# Patient Record
Sex: Male | Born: 1965 | Race: White | Hispanic: No | Marital: Married | Smoking: Never smoker
Health system: Southern US, Academic
[De-identification: ages and names within clinical notes are randomized; demographics above are authoritative.]

## PROBLEM LIST (undated history)

## (undated) DIAGNOSIS — E785 Hyperlipidemia, unspecified: Secondary | ICD-10-CM

## (undated) DIAGNOSIS — K219 Gastro-esophageal reflux disease without esophagitis: Secondary | ICD-10-CM

## (undated) DIAGNOSIS — I10 Essential (primary) hypertension: Secondary | ICD-10-CM

## (undated) DIAGNOSIS — Z8669 Personal history of other diseases of the nervous system and sense organs: Secondary | ICD-10-CM

## (undated) DIAGNOSIS — G4730 Sleep apnea, unspecified: Secondary | ICD-10-CM

## (undated) DIAGNOSIS — Z9989 Dependence on other enabling machines and devices: Secondary | ICD-10-CM

## (undated) DIAGNOSIS — H8109 Meniere's disease, unspecified ear: Secondary | ICD-10-CM

## (undated) DIAGNOSIS — E7800 Pure hypercholesterolemia, unspecified: Secondary | ICD-10-CM

## (undated) DIAGNOSIS — Z973 Presence of spectacles and contact lenses: Secondary | ICD-10-CM

## (undated) DIAGNOSIS — Z87442 Personal history of urinary calculi: Secondary | ICD-10-CM

## (undated) DIAGNOSIS — C61 Malignant neoplasm of prostate: Secondary | ICD-10-CM

## (undated) DIAGNOSIS — J302 Other seasonal allergic rhinitis: Secondary | ICD-10-CM

## (undated) DIAGNOSIS — B20 Human immunodeficiency virus [HIV] disease: Secondary | ICD-10-CM

## (undated) DIAGNOSIS — Z21 Asymptomatic human immunodeficiency virus [HIV] infection status: Secondary | ICD-10-CM

## (undated) DIAGNOSIS — J189 Pneumonia, unspecified organism: Secondary | ICD-10-CM

## (undated) HISTORY — DX: Other seasonal allergic rhinitis: J30.2

## (undated) HISTORY — PX: PROSTATE BIOPSY: SHX241

## (undated) HISTORY — DX: Human immunodeficiency virus (HIV) disease: B20

## (undated) HISTORY — DX: Asymptomatic human immunodeficiency virus (hiv) infection status: Z21

## (undated) HISTORY — PX: PILONIDAL CYST EXCISION: SHX744

## (undated) HISTORY — DX: Dependence on other enabling machines and devices: Z99.89

## (undated) HISTORY — PX: NEPHRECTOMY RADICAL: SUR878

## (undated) HISTORY — PX: URETHRA SURGERY: SHX824

## (undated) HISTORY — PX: HX MENISCECTOMY: SHX123

## (undated) HISTORY — DX: Essential (primary) hypertension: I10

## (undated) HISTORY — PX: KNEE ARTHROSCOPY W/ MENISCAL REPAIR: SHX1877

## (undated) HISTORY — DX: Hyperlipidemia, unspecified: E78.5

## (undated) HISTORY — DX: Gastro-esophageal reflux disease without esophagitis: K21.9

## (undated) HISTORY — PX: JOINT REPLACEMENT: SHX530

---

## 2002-08-08 ENCOUNTER — Emergency Department (HOSPITAL_COMMUNITY): Admission: EM | Admit: 2002-08-08 | Discharge: 2002-08-09 | Payer: Self-pay | Admitting: *Deleted

## 2002-09-21 ENCOUNTER — Emergency Department (HOSPITAL_COMMUNITY): Admission: EM | Admit: 2002-09-21 | Discharge: 2002-09-21 | Payer: Self-pay | Admitting: Emergency Medicine

## 2003-03-12 ENCOUNTER — Emergency Department: Payer: Self-pay

## 2009-03-30 ENCOUNTER — Ambulatory Visit: Admission: RE | Admit: 2009-03-30 | Payer: Self-pay | Source: Ambulatory Visit | Admitting: EXTERNAL

## 2010-08-20 ENCOUNTER — Emergency Department: Admit: 2010-08-20 | Discharge: 2010-08-20 | Disposition: A | Discharge status: Home / Self Care | Payer: Self-pay

## 2013-03-23 ENCOUNTER — Emergency Department (HOSPITAL_COMMUNITY): Payer: No Typology Code available for payment source

## 2013-03-23 ENCOUNTER — Inpatient Hospital Stay (HOSPITAL_COMMUNITY)
Admission: EM | Admit: 2013-03-23 | Discharge: 2013-03-27 | DRG: 975 | Disposition: A | Discharge status: Home / Self Care | Payer: No Typology Code available for payment source | Attending: Internal Medicine | Admitting: Internal Medicine

## 2013-03-23 ENCOUNTER — Encounter (HOSPITAL_COMMUNITY): Payer: Self-pay | Admitting: Family Medicine

## 2013-03-23 DIAGNOSIS — J189 Pneumonia, unspecified organism: Secondary | ICD-10-CM | POA: Diagnosis present

## 2013-03-23 DIAGNOSIS — R000 Tachycardia, unspecified: Secondary | ICD-10-CM | POA: Diagnosis present

## 2013-03-23 DIAGNOSIS — Z21 Asymptomatic human immunodeficiency virus [HIV] infection status: Secondary | ICD-10-CM

## 2013-03-23 DIAGNOSIS — B1910 Unspecified viral hepatitis B without hepatic coma: Secondary | ICD-10-CM | POA: Diagnosis present

## 2013-03-23 DIAGNOSIS — B20 Human immunodeficiency virus [HIV] disease: Principal | ICD-10-CM | POA: Diagnosis present

## 2013-03-23 DIAGNOSIS — B59 Pneumocystosis: Secondary | ICD-10-CM | POA: Diagnosis present

## 2013-03-23 DIAGNOSIS — R109 Unspecified abdominal pain: Secondary | ICD-10-CM | POA: Diagnosis present

## 2013-03-23 DIAGNOSIS — Z7982 Long term (current) use of aspirin: Secondary | ICD-10-CM

## 2013-03-23 DIAGNOSIS — R0902 Hypoxemia: Secondary | ICD-10-CM | POA: Diagnosis present

## 2013-03-23 DIAGNOSIS — N200 Calculus of kidney: Secondary | ICD-10-CM | POA: Diagnosis present

## 2013-03-23 DIAGNOSIS — Z79899 Other long term (current) drug therapy: Secondary | ICD-10-CM

## 2013-03-23 DIAGNOSIS — I10 Essential (primary) hypertension: Secondary | ICD-10-CM | POA: Diagnosis present

## 2013-03-23 DIAGNOSIS — B159 Hepatitis A without hepatic coma: Secondary | ICD-10-CM | POA: Diagnosis present

## 2013-03-23 LAB — POCT I-STAT, CHEM 8
BUN: 9 mg/dL (ref 6–23)
Chloride: 100 mEq/L (ref 96–112)
Creatinine, Ser: 1.1 mg/dL (ref 0.50–1.35)
Sodium: 139 mEq/L (ref 135–145)

## 2013-03-23 LAB — CBC
HCT: 37.8 % — ABNORMAL LOW (ref 39.0–52.0)
Hemoglobin: 13.9 g/dL (ref 13.0–17.0)
MCH: 29.7 pg (ref 26.0–34.0)
MCV: 87.1 fL (ref 78.0–100.0)
Platelets: 204 10*3/uL (ref 150–400)
Platelets: 180 10*3/uL (ref 150–400)
RBC: 4.7 MIL/uL (ref 4.22–5.81)
RBC: 4.34 MIL/uL (ref 4.22–5.81)
RDW: 12.4 % (ref 11.5–15.5)
WBC: 10.5 10*3/uL (ref 4.0–10.5)
WBC: 8.6 10*3/uL (ref 4.0–10.5)

## 2013-03-23 LAB — COMPREHENSIVE METABOLIC PANEL
ALT: 51 U/L (ref 0–53)
AST: 36 U/L (ref 0–37)
Albumin: 4 g/dL (ref 3.5–5.2)
CO2: 27 mEq/L (ref 19–32)
Chloride: 97 mEq/L (ref 96–112)
Creatinine, Ser: 0.96 mg/dL (ref 0.50–1.35)
GFR calc non Af Amer: >90 mL/min (ref >90)
Sodium: 134 mEq/L — ABNORMAL LOW (ref 135–145)
Total Bilirubin: 0.8 mg/dL (ref 0.3–1.2)

## 2013-03-23 LAB — URINALYSIS, ROUTINE W REFLEX MICROSCOPIC
Glucose, UA: NEGATIVE mg/dL
Ketones, ur: NEGATIVE mg/dL
Leukocytes, UA: NEGATIVE
Nitrite: NEGATIVE
Specific Gravity, Urine: 1.022 (ref 1.005–1.030)
pH: 5 (ref 5.0–8.0)

## 2013-03-23 LAB — URINE MICROSCOPIC-ADD ON

## 2013-03-23 LAB — CG4 I-STAT (LACTIC ACID): Lactic Acid, Venous: 1.55 mmol/L (ref 0.5–2.2)

## 2013-03-23 LAB — CREATININE, SERUM: GFR calc Af Amer: >90 mL/min (ref >90)

## 2013-03-23 MED ORDER — SODIUM CHLORIDE 0.9 % IJ SOLN
3.0000 mL | Freq: Two times a day (BID) | INTRAMUSCULAR | Status: DC
  Administered 2013-03-23 – 2013-03-25 (×3): 3 mL via INTRAVENOUS

## 2013-03-23 MED ORDER — ONDANSETRON HCL 4 MG PO TABS: 4.0000 mg | ORAL_TABLET | Freq: Four times a day (QID) | ORAL | Status: DC | PRN

## 2013-03-23 MED ORDER — ONDANSETRON HCL 4 MG/2ML IJ SOLN
4.0000 mg | Freq: Once | INTRAMUSCULAR | Status: AC
  Administered 2013-03-23: 4 mg via INTRAVENOUS
  Filled 2013-03-23: qty 2

## 2013-03-23 MED ORDER — HYDROMORPHONE HCL PF 1 MG/ML IJ SOLN
1.0000 mg | Freq: Once | INTRAMUSCULAR | Status: AC
  Administered 2013-03-23: 1 mg via INTRAVENOUS
  Filled 2013-03-23: qty 1

## 2013-03-23 MED ORDER — HYDROMORPHONE HCL PF 2 MG/ML IJ SOLN: 1.0000 mg | INTRAMUSCULAR | Status: DC | PRN

## 2013-03-23 MED ORDER — IOHEXOL 300 MG/ML  SOLN
100.0000 mL | Freq: Once | INTRAMUSCULAR | Status: AC | PRN
  Administered 2013-03-23: 100 mL via INTRAVENOUS

## 2013-03-23 MED ORDER — LEVOFLOXACIN IN D5W 750 MG/150ML IV SOLN
750.0000 mg | INTRAVENOUS | Status: DC
  Administered 2013-03-24: 750 mg via INTRAVENOUS
  Filled 2013-03-23: qty 150

## 2013-03-23 MED ORDER — ONDANSETRON HCL 4 MG/2ML IJ SOLN
4.0000 mg | Freq: Four times a day (QID) | INTRAMUSCULAR | Status: DC | PRN
  Administered 2013-03-23: 4 mg via INTRAVENOUS
  Filled 2013-03-23: qty 2

## 2013-03-23 MED ORDER — IOHEXOL 300 MG/ML  SOLN
50.0000 mL | Freq: Once | INTRAMUSCULAR | Status: AC | PRN
  Administered 2013-03-23: 50 mL via ORAL

## 2013-03-23 MED ORDER — LEVOFLOXACIN IN D5W 750 MG/150ML IV SOLN
750.0000 mg | Freq: Once | INTRAVENOUS | Status: AC
  Administered 2013-03-23: 750 mg via INTRAVENOUS
  Filled 2013-03-23: qty 150

## 2013-03-23 MED ORDER — SODIUM CHLORIDE 0.9 % IV BOLUS (SEPSIS)
1000.0000 mL | Freq: Once | INTRAVENOUS | Status: AC
  Administered 2013-03-23: 1000 mL via INTRAVENOUS

## 2013-03-23 MED ORDER — HYDROCODONE-ACETAMINOPHEN 5-325 MG PO TABS
1.0000 | ORAL_TABLET | Freq: Four times a day (QID) | ORAL | Status: DC | PRN
  Administered 2013-03-23 – 2013-03-26 (×3): 1 via ORAL
  Filled 2013-03-23 (×3): qty 1

## 2013-03-23 MED ORDER — LORATADINE 10 MG PO TABS
10.0000 mg | ORAL_TABLET | Freq: Every day | ORAL | Status: DC
  Administered 2013-03-23 – 2013-03-27 (×5): 10 mg via ORAL
  Filled 2013-03-23 (×5): qty 1

## 2013-03-23 MED ORDER — MORPHINE SULFATE 2 MG/ML IJ SOLN
2.0000 mg | INTRAMUSCULAR | Status: DC | PRN
  Administered 2013-03-23 (×2): 4 mg via INTRAVENOUS
  Filled 2013-03-23 (×3): qty 2

## 2013-03-23 MED ORDER — ENOXAPARIN SODIUM 40 MG/0.4ML ~~LOC~~ SOLN
40.0000 mg | SUBCUTANEOUS | Status: DC
  Administered 2013-03-23 – 2013-03-27 (×5): 40 mg via SUBCUTANEOUS
  Filled 2013-03-23 (×5): qty 0.4

## 2013-03-23 MED ORDER — HYDROMORPHONE HCL PF 2 MG/ML IJ SOLN
1.0000 mg | INTRAMUSCULAR | Status: DC | PRN
  Administered 2013-03-23 – 2013-03-24 (×6): 2 mg via INTRAVENOUS
  Administered 2013-03-24: 1 mg via INTRAVENOUS
  Administered 2013-03-25: 2 mg via INTRAVENOUS
  Filled 2013-03-23 (×8): qty 1

## 2013-03-23 MED ORDER — DEXTROSE-NACL 5-0.9 % IV SOLN
INTRAVENOUS | Status: DC
  Administered 2013-03-23 – 2013-03-27 (×11): via INTRAVENOUS

## 2013-03-23 MED ORDER — ACETAMINOPHEN 325 MG PO TABS
650.0000 mg | ORAL_TABLET | Freq: Once | ORAL | Status: AC
  Administered 2013-03-23: 650 mg via ORAL
  Filled 2013-03-23: qty 2

## 2013-03-23 NOTE — ED Notes (Signed)
Pt complains of right sided pain that hurts when he takes a deep breath or coughs, no injury noted, states that it feels like it did when he had a kidney stone but the pain is higher up and he's not having pain or blood when urinating

## 2013-03-23 NOTE — ED Notes (Signed)
Pt drinking contrast. 

## 2013-03-23 NOTE — ED Notes (Signed)
Patient states that he has started having right rib pain on Sunday. States pain has gotten worse. Pain worse with inspiration and coughing. States having shortness of breath.

## 2013-03-23 NOTE — ED Notes (Signed)
Pt completed contrast, ct aware

## 2013-03-23 NOTE — ED Notes (Signed)
Pt returned from CT °

## 2013-03-23 NOTE — H&P (Signed)
[facility] History and Physical  BOBIE KISTLER ZOX:[id number] DOB: November 20, 1965    PCP:  None.  Chief Complaint: shortness of breath and right flank pain.  HPI: Travis Wheeler is an 47 y.o. male with hx of HIV positive, diagnosed 2009, in denial and not seeking any evaluation for his HIV, presents to ER with right flank pain, minimal cough, and shortness of breath.  It has been for 2-3 days.  He has some subjective fever, no chills, no chest pain.  Evaluation in the ER included a CXR showing bibasilar infiltrates, normal WBC and renal fx tests.  He has tachycardia and hypertension in the ER.  A CTPA was performed showing no PE, but concern for PNA and non obtructive nephrolithiasis on the left side.  He was also desat to 85 percent with ambulation.  Hospitalist was asked to admit him for CAP.  Rewiew of Systems:  Constitutional: Negative for  and chills. No significant weight loss or weight gain Eyes: Negative for eye pain, redness and discharge, diplopia, visual changes, or flashes of light. ENMT: Negative for ear pain, hoarseness, nasal congestion, sinus pressure and sore throat. No headaches; tinnitus, drooling, or problem swallowing. Cardiovascular: Negative for chest pain, palpitations, diaphoresis, dyspnea and peripheral edema. ; No orthopnea, PND Respiratory: Negative for hemoptysis, wheezing and stridor. No pleuritic chestpain. Gastrointestinal: Negative for nausea, vomiting, diarrhea, constipation, abdominal pain, melena, blood in stool, hematemesis, jaundice and rectal bleeding.    Genitourinary: Negative for frequency, dysuria, incontinence and hematuria; Musculoskeletal: Negative for back pain and neck pain. Negative for swelling and trauma.;  Skin: . Negative for pruritus, rash, abrasions, bruising and skin lesion.; ulcerations Neuro: Negative for headache, lightheadedness and neck stiffness. Negative for weakness, altered level of consciousness , altered mental status,  extremity weakness, burning feet, involuntary movement, seizure and syncope.  Psych: negative for anxiety, depression, insomnia, tearfulness, panic attacks, hallucinations, paranoia, suicidal or homicidal ideation   Past Medical History  Diagnosis Date  . Kidney stones     History reviewed. No pertinent past surgical history.  Medications:  HOME MEDS: Prior to Admission medications   Medication Sig Start Date End Date Taking? Authorizing Provider  aspirin EC 81 MG tablet Take 162 mg by mouth every 6 (six) hours as needed for pain.   Yes Historical Provider, MD  cetirizine (ZYRTEC) 10 MG tablet Take 10 mg by mouth daily.   Yes Historical Provider, MD     Allergies:  No Known Allergies  Social History:   reports that he has never smoked. He does not have any smokeless tobacco history on file. He reports that  drinks alcohol. He reports that he does not use illicit drugs.  Family History: No family history on file.   Physical Exam: Filed Vitals:   03/23/13 0152 03/23/13 0405 03/23/13 0409 03/23/13 0413  BP: 131/91  157/92   Pulse: 128   113  Temp: 102.5 F (39.2 C) 100.8 F (38.2 C)    TempSrc: Oral Oral    Resp: 22   28  Height: 5\' 11"  (1.803 m)     Weight: 79.379 kg (175 lb)     SpO2: 96%   92%   Blood pressure 157/92, pulse 113, temperature 100.8 F (38.2 C), temperature source Oral, resp. rate 28, height 5\' 11"  (1.803 m), weight 79.379 kg (175 lb), SpO2 92.00%.  GEN:  Pleasant  patient lying in the stretcher in no acute distress; cooperative with exam. PSYCH:  alert and oriented x4; does not appear  anxious or depressed; affect is appropriate. HEENT: Mucous membranes pink and anicteric; PERRLA; EOM intact; no cervical lymphadenopathy nor thyromegaly or carotid bruit; no JVD; There were no stridor. Neck is very supple. Breasts:: Not examined CHEST WALL: No tenderness CHEST: Normal respiration, scattered rhonchi. HEART: Regular rate and rhythm.  There are no  murmur, rub, or gallops.   BACK: No kyphosis or scoliosis; no CVA tenderness ABDOMEN: soft and non-tender; no masses, no organomegaly, normal abdominal bowel sounds; no pannus; no intertriginous candida. There is no rebound and no distention. Rectal Exam: Not done EXTREMITIES: No bone or joint deformity; age-appropriate arthropathy of the hands and knees; no edema; no ulcerations.  There is no calf tenderness. Genitalia: not examined PULSES: 2+ and symmetric SKIN: Normal hydration no rash or ulceration CNS: Cranial nerves 2-12 grossly intact no focal lateralizing neurologic deficit.  Speech is fluent; uvula elevated with phonation, facial symmetry and tongue midline. DTR are normal bilaterally, cerebella exam is intact, barbinski is negative and strengths are equaled bilaterally.  No sensory loss.   Labs on Admission:  Basic Metabolic Panel:  Recent Labs Lab 03/23/13 0210 03/23/13 0221  NA 134* 139  K 3.6 3.7  CL 97 100  CO2 27  --   GLUCOSE 132* 132*  BUN 9 9  CREATININE 0.96 1.10  CALCIUM 9.4  --    Liver Function Tests:  Recent Labs Lab 03/23/13 0210  AST 36  ALT 51  ALKPHOS 98  BILITOT 0.8  PROT 9.3*  ALBUMIN 4.0    Recent Labs Lab 03/23/13 0210  LIPASE 14   No results found for this basename: AMMONIA,  in the last 168 hours CBC:  Recent Labs Lab 03/23/13 0210 03/23/13 0221  WBC 10.5  --   HGB 13.9 15.3  HCT 40.7 45.0  MCV 86.6  --   PLT 204  --    Cardiac Enzymes: No results found for this basename: CKTOTAL, CKMB, CKMBINDEX, TROPONINI,  in the last 168 hours  CBG: No results found for this basename: GLUCAP,  in the last 168 hours   Radiological Exams on Admission: Dg Chest 2 View  03/23/2013   *RADIOLOGY REPORT*  Clinical Data: Shortness of breath, fever.  CHEST - 2 VIEW  Comparison: None.  Findings: Bibasilar atelectasis or infiltrates, right greater than left.  Heart is normal size.  No effusions or acute bony abnormality.  IMPRESSION:  Bibasilar airspace opacities, right greater than left.  This could represent atelectasis or infiltrates.  Cannot exclude pneumonia, particularly in the right lung base.   Original Report Authenticated By: Charlett Nose, M.D.   Ct Abdomen Pelvis W Contrast  03/23/2013   *RADIOLOGY REPORT*  Clinical Data: Right-sided abdominal pain, fever.  CT ABDOMEN AND PELVIS WITH CONTRAST  Technique:  Multidetector CT imaging of the abdomen and pelvis was performed following the standard protocol during bolus administration of intravenous contrast.  Contrast: OMNIPAQUE IOHEXOL 300 MG/ML  SOLN  Comparison: None.  Findings: Bilateral lower lobe airspace consolidation, right greater than left concerning for pneumonia.  Trace right pleural effusion.  Heart is normal size.  Liver, gallbladder, spleen, pancreas, adrenals have an unremarkable appearance.  Small scattered low density areas within the kidneys, likely small cysts.  8 mm nonobstructing left lower pole renal stone.  No hydronephrosis.  No ureteral stones.  Urinary bladder is unremarkable.  Normal retrocecal appendix.  Moderate gas and stool throughout the colon.  Small bowel is decompressed.  No free fluid, free air or adenopathy.  Aorta is normal caliber.  No acute bony abnormality.  IMPRESSION: Bilateral lower lobe infiltrates, right greater than left concerning for pneumonia.  Trace right pleural effusion.  Small bilateral renal cysts.  Left nephrolithiasis.  Normal appendix.   Original Report Authenticated By: Charlett Nose, M.D.    Assessment/Plan Present on Admission:  . CAP (community acquired pneumonia) . Right flank pain . HIV (human immunodeficiency virus infection)  PLAN:  Will admit him for CAP.  I have started him on Levaquin and oxygen.  He was encouraged to seek medical care for his HIV, as he could live a long and meaningful life with HIV disease.  He is otherwise stable, full code, and will be admitted to [facility] service.  I think the right flank is  from the PNA itself.  He does have tachycardia and hypertension which will be followed.  I will treat both with morphine.  He is stable, full code, and will be admitted [facility] service.  Thank you for allowing me to participate in the care of this nice patient.  Other plans as per orders.  Code Status: FULL Unk Lightning, MD. [facility] Pager [phone] 7pm to 7am.  03/23/2013, 6:39 AM

## 2013-03-23 NOTE — Progress Notes (Signed)
[facility] PROGRESS NOTE  Travis Wheeler ZOX:[id number] DOB: 06/07/1966 DOA: 03/23/2013 PCP: No primary provider on file.  Assessment/Plan: 1. Community acquired PNA: Continue with Levaquin.  2. History of HIV: has not follow up with PCP. I will order CD4 count, Viral load. Will consider ID consult.   Code Status: Full  Family Communication: Care discussed with patient.  Disposition Plan: home when stable.    Consultants:  none  Procedures:  None  Antibiotics:  Levaquin  HPI/Subjective: Feeling better. Relates dry cough. He has not follow up with a d DR for HIV.   Objective: Filed Vitals:   03/23/13 0409 03/23/13 0413 03/23/13 0826 03/23/13 1509  BP: 157/92  140/79 147/72  Pulse:  113 105 105  Temp:   98.9 F (37.2 C) 99.4 F (37.4 C)  TempSrc:   Oral Oral  Resp:  28 20 18   Height:   5\' 11"  (1.803 m)   Weight:   79.379 kg (175 lb)   SpO2:  92% 100% 99%    Intake/Output Summary (Last 24 hours) at 03/23/13 1723 Last data filed at 03/23/13 1717  Gross per 24 hour  Intake    240 ml  Output   1800 ml  Net  -1560 ml   Filed Weights   03/23/13 0152 03/23/13 0826  Weight: 79.379 kg (175 lb) 79.379 kg (175 lb)    Exam:   General:  No distress.   Cardiovascular: S 1, S 2 RRR  Respiratory: bilateral ronchus  Abdomen: BS present. Soft, NT  Musculoskeletal: no edema.   Data Reviewed: Basic Metabolic Panel:  Recent Labs Lab 03/23/13 0210 03/23/13 0221 03/23/13 0845  NA 134* 139  --   K 3.6 3.7  --   CL 97 100  --   CO2 27  --   --   GLUCOSE 132* 132*  --   BUN 9 9  --   CREATININE 0.96 1.10 0.84  CALCIUM 9.4  --   --    Liver Function Tests:  Recent Labs Lab 03/23/13 0210  AST 36  ALT 51  ALKPHOS 98  BILITOT 0.8  PROT 9.3*  ALBUMIN 4.0    Recent Labs Lab 03/23/13 0210  LIPASE 14   No results found for this basename: AMMONIA,  in the last 168 hours CBC:  Recent Labs Lab 03/23/13 0210 03/23/13 0221 03/23/13 0845   WBC 10.5  --  8.6  HGB 13.9 15.3 12.9*  HCT 40.7 45.0 37.8*  MCV 86.6  --  87.1  PLT 204  --  180   Cardiac Enzymes: No results found for this basename: CKTOTAL, CKMB, CKMBINDEX, TROPONINI,  in the last 168 hours BNP (last 3 results) No results found for this basename: PROBNP,  in the last 8760 hours CBG: No results found for this basename: GLUCAP,  in the last 168 hours  No results found for this or any previous visit (from the past 240 hour(s)).   Studies: Dg Chest 2 View  03/23/2013   *RADIOLOGY REPORT*  Clinical Data: Shortness of breath, fever.  CHEST - 2 VIEW  Comparison: None.  Findings: Bibasilar atelectasis or infiltrates, right greater than left.  Heart is normal size.  No effusions or acute bony abnormality.  IMPRESSION: Bibasilar airspace opacities, right greater than left.  This could represent atelectasis or infiltrates.  Cannot exclude pneumonia, particularly in the right lung base.   Original Report Authenticated By: Charlett Nose, M.D.   Ct Abdomen Pelvis W Contrast  03/23/2013   *  RADIOLOGY REPORT*  Clinical Data: Right-sided abdominal pain, fever.  CT ABDOMEN AND PELVIS WITH CONTRAST  Technique:  Multidetector CT imaging of the abdomen and pelvis was performed following the standard protocol during bolus administration of intravenous contrast.  Contrast: OMNIPAQUE IOHEXOL 300 MG/ML  SOLN  Comparison: None.  Findings: Bilateral lower lobe airspace consolidation, right greater than left concerning for pneumonia.  Trace right pleural effusion.  Heart is normal size.  Liver, gallbladder, spleen, pancreas, adrenals have an unremarkable appearance.  Small scattered low density areas within the kidneys, likely small cysts.  8 mm nonobstructing left lower pole renal stone.  No hydronephrosis.  No ureteral stones.  Urinary bladder is unremarkable.  Normal retrocecal appendix.  Moderate gas and stool throughout the colon.  Small bowel is decompressed.  No free fluid, free air or  adenopathy.  Aorta is normal caliber.  No acute bony abnormality.  IMPRESSION: Bilateral lower lobe infiltrates, right greater than left concerning for pneumonia.  Trace right pleural effusion.  Small bilateral renal cysts.  Left nephrolithiasis.  Normal appendix.   Original Report Authenticated By: Charlett Nose, M.D.    Scheduled Meds: . enoxaparin (LOVENOX) injection  40 mg Subcutaneous Q24H  . [START ON 03/24/2013] levofloxacin (LEVAQUIN) IV  750 mg Intravenous Q24H  . loratadine  10 mg Oral Daily  . sodium chloride  3 mL Intravenous Q12H   Continuous Infusions: . dextrose 5 % and 0.9% NaCl 100 mL/hr at 03/23/13 0831    Principal Problem:   CAP (community acquired pneumonia) Active Problems:   Right flank pain   HIV (human immunodeficiency virus infection)    Time spent: 25 minutes.     Dennise Bamber  [facility] Pager [phone]. If 7PM-7AM, please contact night-coverage at [email or web address], password [facility] 03/23/2013, 5:23 PM  LOS: 0 days

## 2013-03-23 NOTE — ED Notes (Signed)
Report given to [address] west, pt ready for transport

## 2013-03-23 NOTE — ED Provider Notes (Addendum)
History    CSN: [id number] Arrival date & time 03/23/13  0132  First MD Initiated Contact with Patient 03/23/13 0207     Chief Complaint  Patient presents with  . Chest Pain   (Consider location/radiation/quality/duration/timing/severity/associated sxs/prior Treatment) HPI Hx per PT. R sided flank pain x 2 days not migrating, developed associated fever yesterday and now pain is more severe.  No hematuria, has h/o kidney stone, pain worse with movement or breathing. Some mild dry cough. No trauma. No back pain. No known alleviating factors.   Past Medical History  Diagnosis Date  . Kidney stones    History reviewed. No pertinent past surgical history. No family history on file. History  Substance Use Topics  . Smoking status: Never Smoker   . Smokeless tobacco: Not on file  . Alcohol Use: Yes     Comment: Socially    Review of Systems  Constitutional: Positive for fever and chills.  HENT: Negative for neck pain and neck stiffness.   Eyes: Negative for visual disturbance.  Respiratory: Negative for shortness of breath.   Cardiovascular: Negative for chest pain.  Gastrointestinal: Positive for abdominal pain. Negative for vomiting.  Genitourinary: Negative for hematuria.  Musculoskeletal: Negative for back pain.  Skin: Negative for rash.  Neurological: Negative for headaches.  All other systems reviewed and are negative.    Allergies  Review of patient's allergies indicates no known allergies.  Home Medications   Current Outpatient Rx  Name  Route  Sig  Dispense  Refill  . aspirin EC 81 MG tablet   Oral   Take 162 mg by mouth every 6 (six) hours as needed for pain.         . cetirizine (ZYRTEC) 10 MG tablet   Oral   Take 10 mg by mouth daily.          BP 131/91  Pulse 128  Temp(Src) 102.5 F (39.2 C) (Oral)  Resp 22  Ht 5\' 11"  (1.803 m)  Wt 175 lb (79.379 kg)  BMI 24.42 kg/m2  SpO2 96% Physical Exam  Constitutional: He is oriented to person,  place, and time. He appears well-developed and well-nourished.  HENT:  Head: Normocephalic and atraumatic.  Eyes: EOM are normal. Pupils are equal, round, and reactive to light.  Neck: Neck supple.  Cardiovascular: Regular rhythm and intact distal pulses.   tachycardic  Pulmonary/Chest: Effort normal and breath sounds normal. No respiratory distress. He exhibits no tenderness.  Abdominal: There is no rebound and no guarding.  R flank tenderness, RUQ and RLQ tenderness. ABD soft otherwise  Musculoskeletal: Normal range of motion. He exhibits no edema.  Neurological: He is alert and oriented to person, place, and time.  Skin: Skin is warm and dry.    ED Course  Procedures (including critical care time)  Results for orders placed during the hospital encounter of 03/23/13  CBC      Result Value Range   WBC 10.5  4.0 - 10.5 K/uL   RBC 4.70  4.22 - 5.81 MIL/uL   Hemoglobin 13.9  13.0 - 17.0 g/dL   HCT 40.9  81.1 - 91.4 %   MCV 86.6  78.0 - 100.0 fL   MCH 29.6  26.0 - 34.0 pg   MCHC 34.2  30.0 - 36.0 g/dL   RDW 78.2  95.6 - 21.3 %   Platelets 204  150 - 400 K/uL  URINALYSIS, ROUTINE W REFLEX MICROSCOPIC      Result Value Range  Color, Urine YELLOW  YELLOW   APPearance CLEAR  CLEAR   Specific Gravity, Urine 1.022  1.005 - 1.030   pH 5.0  5.0 - 8.0   Glucose, UA NEGATIVE  NEGATIVE mg/dL   Hgb urine dipstick TRACE (*) NEGATIVE   Bilirubin Urine NEGATIVE  NEGATIVE   Ketones, ur NEGATIVE  NEGATIVE mg/dL   Protein, ur 161 (*) NEGATIVE mg/dL   Urobilinogen, UA 2.0 (*) 0.0 - 1.0 mg/dL   Nitrite NEGATIVE  NEGATIVE   Leukocytes, UA NEGATIVE  NEGATIVE  COMPREHENSIVE METABOLIC PANEL      Result Value Range   Sodium 134 (*) 135 - 145 mEq/L   Potassium 3.6  3.5 - 5.1 mEq/L   Chloride 97  96 - 112 mEq/L   CO2 27  19 - 32 mEq/L   Glucose, Bld 132 (*) 70 - 99 mg/dL   BUN 9  6 - 23 mg/dL   Creatinine, Ser 0.96  0.50 - 1.35 mg/dL   Calcium 9.4  8.4 - 04.5 mg/dL   Total Protein 9.3 (*)  6.0 - 8.3 g/dL   Albumin 4.0  3.5 - 5.2 g/dL   AST 36  0 - 37 U/L   ALT 51  0 - 53 U/L   Alkaline Phosphatase 98  39 - 117 U/L   Total Bilirubin 0.8  0.3 - 1.2 mg/dL   GFR calc non Af Amer >90  >90 mL/min   GFR calc Af Amer >90  >90 mL/min  LIPASE, BLOOD      Result Value Range   Lipase 14  11 - 59 U/L  URINE MICROSCOPIC-ADD ON      Result Value Range   Squamous Epithelial / LPF RARE  RARE   WBC, UA 0-2  <3 WBC/hpf   Urine-Other MUCOUS PRESENT    CG4 I-STAT (LACTIC ACID)      Result Value Range   Lactic Acid, Venous 1.55  0.5 - 2.2 mmol/L  POCT I-STAT, CHEM 8      Result Value Range   Sodium 139  135 - 145 mEq/L   Potassium 3.7  3.5 - 5.1 mEq/L   Chloride 100  96 - 112 mEq/L   BUN 9  6 - 23 mg/dL   Creatinine, Ser 4.09  0.50 - 1.35 mg/dL   Glucose, Bld 811 (*) 70 - 99 mg/dL   Calcium, Ion 9.14  7.82 - 1.23 mmol/L   TCO2 26  0 - 100 mmol/L   Hemoglobin 15.3  13.0 - 17.0 g/dL   HCT 95.6  21.3 - 08.6 %   Dg Chest 2 View  03/23/2013   *RADIOLOGY REPORT*  Clinical Data: Shortness of breath, fever.  CHEST - 2 VIEW  Comparison: None.  Findings: Bibasilar atelectasis or infiltrates, right greater than left.  Heart is normal size.  No effusions or acute bony abnormality.  IMPRESSION: Bibasilar airspace opacities, right greater than left.  This could represent atelectasis or infiltrates.  Cannot exclude pneumonia, particularly in the right lung base.   Original Report Authenticated By: Charlett Nose, M.D.   Ct Abdomen Pelvis W Contrast  03/23/2013   *RADIOLOGY REPORT*  Clinical Data: Right-sided abdominal pain, fever.  CT ABDOMEN AND PELVIS WITH CONTRAST  Technique:  Multidetector CT imaging of the abdomen and pelvis was performed following the standard protocol during bolus administration of intravenous contrast.  Contrast: OMNIPAQUE IOHEXOL 300 MG/ML  SOLN  Comparison: None.  Findings: Bilateral lower lobe airspace consolidation, right greater than left concerning  for pneumonia.   Trace right pleural effusion.  Heart is normal size.  Liver, gallbladder, spleen, pancreas, adrenals have an unremarkable appearance.  Small scattered low density areas within the kidneys, likely small cysts.  8 mm nonobstructing left lower pole renal stone.  No hydronephrosis.  No ureteral stones.  Urinary bladder is unremarkable.  Normal retrocecal appendix.  Moderate gas and stool throughout the colon.  Small bowel is decompressed.  No free fluid, free air or adenopathy.  Aorta is normal caliber.  No acute bony abnormality.  IMPRESSION: Bilateral lower lobe infiltrates, right greater than left concerning for pneumonia.  Trace right pleural effusion.  Small bilateral renal cysts.  Left nephrolithiasis.  Normal appendix.   Original Report Authenticated By: Charlett Nose, M.D.   IVFs, IV Dilaudid, IV zofran  IV levaquin  5:15 AM RA sats 85% with ambulation, O2 provided, MED consult. Dr. Conley Rolls to admit MDM   Right-sided pain with fever 102.5  Found to have bilateral pneumonia  Evaluated with labs and imaging as above  IV narcotics pain control and IV antibiotics provided  Has oxygen requirement/ medical admit  Sunnie Nielsen, MD 03/23/13 0541   Date: 03/23/2013  Rate: 117  Rhythm: sinus tachycardia  QRS Axis: normal  Intervals: normal  ST/T Wave abnormalities: nonspecific ST changes  Conduction Disutrbances:none  Narrative Interpretation:   Old EKG Reviewed: none available    Sunnie Nielsen, MD 03/23/13 [phone]

## 2013-03-24 LAB — HIV-1 RNA QUANT-NO REFLEX-BLD: HIV-1 RNA Quant, Log: 4.75 {Log} — ABNORMAL HIGH (ref <1.30)

## 2013-03-24 LAB — CD4/CD8 (T-HELPER/T-SUPPRESSOR CELL)
CD4 absolute: 100 /uL — ABNORMAL LOW (ref 500–1900)
CD8 T Cell Abs: 920 /uL (ref 230–1000)
CD8tox: 65 % — ABNORMAL HIGH (ref 15.0–40.0)
Total lymphocyte count: 1410 /uL (ref 1000–4000)

## 2013-03-24 LAB — BLOOD GAS, ARTERIAL
Acid-Base Excess: 3.4 mmol/L — ABNORMAL HIGH (ref 0.0–2.0)
Bicarbonate: 28 mEq/L — ABNORMAL HIGH (ref 20.0–24.0)
TCO2: 25.2 mmol/L (ref 0–100)
pCO2 arterial: 44.7 mmHg (ref 35.0–45.0)
pH, Arterial: 7.413 (ref 7.350–7.450)

## 2013-03-24 MED ORDER — ELVITEG-COBIC-EMTRICIT-TENOFDF 150-150-200-300 MG PO TABS
1.0000 | ORAL_TABLET | Freq: Every day | ORAL | Status: DC
  Administered 2013-03-25 – 2013-03-27 (×3): 1 via ORAL
  Filled 2013-03-24 (×5): qty 1

## 2013-03-24 MED ORDER — PREDNISONE 20 MG PO TABS
40.0000 mg | ORAL_TABLET | Freq: Two times a day (BID) | ORAL | Status: DC
  Administered 2013-03-24 – 2013-03-27 (×6): 40 mg via ORAL
  Filled 2013-03-24 (×8): qty 2

## 2013-03-24 MED ORDER — LEVOFLOXACIN 750 MG PO TABS
750.0000 mg | ORAL_TABLET | Freq: Every day | ORAL | Status: DC
  Administered 2013-03-25 – 2013-03-27 (×3): 750 mg via ORAL
  Filled 2013-03-24 (×3): qty 1

## 2013-03-24 MED ORDER — SULFAMETHOXAZOLE-TMP DS 800-160 MG PO TABS
2.0000 | ORAL_TABLET | Freq: Three times a day (TID) | ORAL | Status: DC
  Administered 2013-03-24 – 2013-03-27 (×9): 2 via ORAL
  Filled 2013-03-24 (×12): qty 2

## 2013-03-24 NOTE — Progress Notes (Signed)
ANTIBIOTIC CONSULT NOTE - INITIAL  Pharmacy Consult for Septra Indication: r/o PCP PNA in HIV positive patient  No Known Allergies  Patient Measurements: Height: 5\' 11"  (180.3 cm) Weight: 175 lb (79.379 kg) IBW/kg (Calculated) : 75.3  Vital Signs: Temp: 99.8 F (37.7 C) (06/26 0456) Temp src: Oral (06/26 0456) BP: 137/71 mmHg (06/26 0456) Pulse Rate: 120 (06/26 0456) Intake/Output from previous day: 06/25 0701 - 06/26 0700 In: 2761.7 [P.O.:480; I.V.:2131.7; IV Piggyback:150] Out: 2950 [Urine:2950] Intake/Output from this shift: Total I/O In: 120 [P.O.:120] Out: -   Labs:  Recent Labs  03/23/13 0210 03/23/13 0221 03/23/13 0845  WBC 10.5  --  8.6  HGB 13.9 15.3 12.9*  PLT 204  --  180  CREATININE 0.96 1.10 0.84   Estimated Creatinine Clearance: 115.8 ml/min (by C-G formula based on Cr of 0.84). No results found for this basename: VANCOTROUGH, VANCOPEAK, VANCORANDOM, GENTTROUGH, GENTPEAK, GENTRANDOM, TOBRATROUGH, TOBRAPEAK, TOBRARND, AMIKACINPEAK, AMIKACINTROU, AMIKACIN,  in the last 72 hours   Microbiology: No results found for this or any previous visit (from the past 720 hour(s)).  Medical History: Past Medical History  Diagnosis Date  . Kidney stones     Assessment: 67 yoM with history of HIV presents with pneumonia (6/25 CXR showing bibasilar infiltrates).  Pt was not on HAART or OI prophylaxis PTA.  6/25 CD4 count 100.  Started on Levaquin and pharmacy asked to dose Septra for treatment for PCP PNA until ruled out.  Renal function WNL.  Goal of Therapy:  Eradication of infection  Plan:  Septra 2 DS tablets q8h. F/u ID work-up.  Clance Boll 03/24/2013,11:23 AM

## 2013-03-24 NOTE — Consult Note (Signed)
    [facility]     Reason for Consult: ?PCP, 042    Referring Physician: Dr. Sunnie Nielsen  Principal Problem:   CAP (community acquired pneumonia) Active Problems:   Right flank pain   HIV (human immunodeficiency virus infection)   . [START ON 03/25/2013] elvitegravir-cobicistat-emtricitabine-tenofovir  1 tablet Oral Q breakfast  . enoxaparin (LOVENOX) injection  40 mg Subcutaneous Q24H  . [START ON 03/25/2013] levofloxacin  750 mg Oral Daily  . loratadine  10 mg Oral Daily  . sodium chloride  3 mL Intravenous Q12H  . sulfamethoxazole-trimethoprim  2 tablet Oral Q8H    Recommendations: PCPtreatment with Bactrim  And steroids I will start Stribild due to PCP pneumonia with better outcomes on ARVs HIV labs Continue levaquin as well, I have changed to po    Assessment: He has AIDS and now PCP pneumonia I suspect with low pO2 and low walking O2 sat.     Antibiotics: Levaquin, bactrim, steroids  HPI: Travis Wheeler is a 47 y.o. male with history of 042 and did not seek care due to  Cost issues here with SOB and hypoxia likely PCPpneumonia.  No history of OIs, STIs.  Lives in [place] area.  No fever but noted chills.  Has know diagnosis for some time.     Review of Systems: A comprehensive review of systems was negative.  Past Medical History  Diagnosis Date  . Kidney stones     History  Substance Use Topics  . Smoking status: Never Smoker   . Smokeless tobacco: Never Used  . Alcohol Use: Yes     Comment: Socially    History reviewed. No pertinent family history. No Known Allergies  OBJECTIVE: Blood pressure 137/71, pulse 120, temperature 99.8 F (37.7 C), temperature source Oral, resp. rate 18, height 5\' 11"  (1.803 m), weight 175 lb (79.379 kg), SpO2 90.00%. General: AAO x3, nad Skin: no rashes Lungs: some congestion but no crackles appreciated Cor: RRR Abdomen: Soft, nt, nd, +bs LAD - no lad cervical,  supraclavicular  Microbiology: No results found for this or any previous visit (from the past 240 hour(s)).  Staci Righter, MD [facility] [email or web address] [phone] pager  [phone] cell 03/24/2013, 1:15 PM

## 2013-03-24 NOTE — Progress Notes (Signed)
[facility] PROGRESS NOTE  Travis Wheeler ZOX:[id number] DOB: 13-Nov-1965 DOA: 03/23/2013 PCP: No primary provider on file.  Assessment/Plan: 1. Community acquired PNA: Continue with Levaquin. CD 4 less than 100. Will start treatment for PCP PNA. Will get ABG and consider prednisone if POA less than 70.  2. History of HIV: has not follow up with PCP. CD4 less than 100. Dr Luciana Axe consulted.   Code Status: Full  Family Communication: Care discussed with patient.  Disposition Plan: home when stable.    Consultants:  none  Procedures:  None  Antibiotics:  Levaquin  HPI/Subjective: Feeling better, still with right side flank, chest pain.  He agree to speak with ID.  He does not want that we give information to his family.   Objective: Filed Vitals:   03/23/13 0826 03/23/13 1509 03/23/13 2215 03/24/13 0456  BP: 140/79 147/72 134/79 137/71  Pulse: 105 105 105 120  Temp: 98.9 F (37.2 C) 99.4 F (37.4 C) 100 F (37.8 C) 99.8 F (37.7 C)  TempSrc: Oral Oral Oral Oral  Resp: 20 18 18 18   Height: 5\' 11"  (1.803 m)     Weight: 79.379 kg (175 lb)     SpO2: 100% 99% 99% 90%    Intake/Output Summary (Last 24 hours) at 03/24/13 1106 Last data filed at 03/24/13 0900  Gross per 24 hour  Intake 2881.66 ml  Output   2400 ml  Net 481.66 ml   Filed Weights   03/23/13 0152 03/23/13 0826  Weight: 79.379 kg (175 lb) 79.379 kg (175 lb)    Exam:   General:  No distress.   Cardiovascular: S 1, S 2 RRR  Respiratory: bilateral ronchus  Abdomen: BS present. Soft, NT  Musculoskeletal: no edema.   Data Reviewed: Basic Metabolic Panel:  Recent Labs Lab 03/23/13 0210 03/23/13 0221 03/23/13 0845  NA 134* 139  --   K 3.6 3.7  --   CL 97 100  --   CO2 27  --   --   GLUCOSE 132* 132*  --   BUN 9 9  --   CREATININE 0.96 1.10 0.84  CALCIUM 9.4  --   --    Liver Function Tests:  Recent Labs Lab 03/23/13 0210  AST 36  ALT 51  ALKPHOS 98  BILITOT 0.8  PROT  9.3*  ALBUMIN 4.0    Recent Labs Lab 03/23/13 0210  LIPASE 14   No results found for this basename: AMMONIA,  in the last 168 hours CBC:  Recent Labs Lab 03/23/13 0210 03/23/13 0221 03/23/13 0845  WBC 10.5  --  8.6  HGB 13.9 15.3 12.9*  HCT 40.7 45.0 37.8*  MCV 86.6  --  87.1  PLT 204  --  180   Cardiac Enzymes: No results found for this basename: CKTOTAL, CKMB, CKMBINDEX, TROPONINI,  in the last 168 hours BNP (last 3 results) No results found for this basename: PROBNP,  in the last 8760 hours CBG: No results found for this basename: GLUCAP,  in the last 168 hours  No results found for this or any previous visit (from the past 240 hour(s)).   Studies: Dg Chest 2 View  03/23/2013   *RADIOLOGY REPORT*  Clinical Data: Shortness of breath, fever.  CHEST - 2 VIEW  Comparison: None.  Findings: Bibasilar atelectasis or infiltrates, right greater than left.  Heart is normal size.  No effusions or acute bony abnormality.  IMPRESSION: Bibasilar airspace opacities, right greater than left.  This could  represent atelectasis or infiltrates.  Cannot exclude pneumonia, particularly in the right lung base.   Original Report Authenticated By: Charlett Nose, M.D.   Ct Abdomen Pelvis W Contrast  03/23/2013   *RADIOLOGY REPORT*  Clinical Data: Right-sided abdominal pain, fever.  CT ABDOMEN AND PELVIS WITH CONTRAST  Technique:  Multidetector CT imaging of the abdomen and pelvis was performed following the standard protocol during bolus administration of intravenous contrast.  Contrast: OMNIPAQUE IOHEXOL 300 MG/ML  SOLN  Comparison: None.  Findings: Bilateral lower lobe airspace consolidation, right greater than left concerning for pneumonia.  Trace right pleural effusion.  Heart is normal size.  Liver, gallbladder, spleen, pancreas, adrenals have an unremarkable appearance.  Small scattered low density areas within the kidneys, likely small cysts.  8 mm nonobstructing left lower pole renal stone.   No hydronephrosis.  No ureteral stones.  Urinary bladder is unremarkable.  Normal retrocecal appendix.  Moderate gas and stool throughout the colon.  Small bowel is decompressed.  No free fluid, free air or adenopathy.  Aorta is normal caliber.  No acute bony abnormality.  IMPRESSION: Bilateral lower lobe infiltrates, right greater than left concerning for pneumonia.  Trace right pleural effusion.  Small bilateral renal cysts.  Left nephrolithiasis.  Normal appendix.   Original Report Authenticated By: Charlett Nose, M.D.    Scheduled Meds: . enoxaparin (LOVENOX) injection  40 mg Subcutaneous Q24H  . levofloxacin (LEVAQUIN) IV  750 mg Intravenous Q24H  . loratadine  10 mg Oral Daily  . sodium chloride  3 mL Intravenous Q12H   Continuous Infusions: . dextrose 5 % and 0.9% NaCl 100 mL/hr at 03/24/13 1610    Principal Problem:   CAP (community acquired pneumonia) Active Problems:   Right flank pain   HIV (human immunodeficiency virus infection)    Time spent: 25 minutes.     Manjot Hinks  [facility] Pager [phone]. If 7PM-7AM, please contact night-coverage at [email or web address], password [facility] 03/24/2013, 11:06 AM  LOS: 1 day

## 2013-03-25 LAB — HEPATITIS B CORE ANTIBODY, TOTAL: Hep B Core Total Ab: POSITIVE — AB

## 2013-03-25 LAB — BASIC METABOLIC PANEL
Calcium: 9 mg/dL (ref 8.4–10.5)
Creatinine, Ser: 0.79 mg/dL (ref 0.50–1.35)
GFR calc non Af Amer: >90 mL/min (ref >90)
Glucose, Bld: 117 mg/dL — ABNORMAL HIGH (ref 70–99)
Sodium: 139 mEq/L (ref 135–145)

## 2013-03-25 LAB — CBC
MCH: 29.9 pg (ref 26.0–34.0)
MCHC: 34.2 g/dL (ref 30.0–36.0)
MCV: 87.2 fL (ref 78.0–100.0)
Platelets: 222 10*3/uL (ref 150–400)

## 2013-03-25 LAB — HEPATITIS B SURFACE ANTIBODY,QUALITATIVE: Hep B S Ab: REACTIVE — AB

## 2013-03-25 LAB — HEPATITIS A ANTIBODY, TOTAL: Hep A Total Ab: POSITIVE — AB

## 2013-03-25 MED ORDER — HYDROCORTISONE ACETATE 25 MG RE SUPP
25.0000 mg | Freq: Two times a day (BID) | RECTAL | Status: DC | PRN
  Administered 2013-03-25: 25 mg via RECTAL
  Filled 2013-03-25: qty 1

## 2013-03-25 NOTE — Progress Notes (Signed)
[facility] PROGRESS NOTE  Travis Wheeler:[id number] DOB: 1966-07-23 DOA: 03/23/2013 PCP: No primary provider on file.  Assessment/Plan: 1. Community acquired PNA/ PCP PNA: Continue with Levaquin day 3. CD 4 less than 100. Started on  treatment for PCP PNA 6-26. Continue with Bactrim day 2/21. PO2 less than 70. Continue with prednisone 40 mg BID day 2.   2. AIDS/HIV: CD4 less than 100. Dr Luciana Axe consulted. Started on STRIBILD. Hepatitis panel pending. Toxoplasma pending.   Code Status: Full  Family Communication: Care discussed with patient.  Disposition Plan: home when stable.    Consultants:  none  Procedures:  None  Antibiotics:  Levaquin  HPI/Subjective: Feeling better. Still with dry cough.   Objective: Filed Vitals:   03/24/13 1355 03/24/13 1403 03/24/13 2124 03/25/13 0550  BP:  148/86 152/75 139/77  Pulse:  114 119 98  Temp:  100.5 F (38.1 C) 99.4 F (37.4 C) 99.1 F (37.3 C)  TempSrc:  Oral Oral Oral  Resp:  18 18 18   Height:      Weight:      SpO2: 92% 98% 97% 100%    Intake/Output Summary (Last 24 hours) at 03/25/13 0847 Last data filed at 03/25/13 0659  Gross per 24 hour  Intake 3118.33 ml  Output      0 ml  Net 3118.33 ml   Filed Weights   03/23/13 0152 03/23/13 0826  Weight: 79.379 kg (175 lb) 79.379 kg (175 lb)    Exam:   General:  No distress.   Cardiovascular: S 1, S 2 RRR  Respiratory: bilateral ronchus  Abdomen: BS present. Soft, NT  Musculoskeletal: no edema.   Data Reviewed: Basic Metabolic Panel:  Recent Labs Lab 03/23/13 0210 03/23/13 0221 03/23/13 0845 03/25/13 0540  NA 134* 139  --  139  K 3.6 3.7  --  3.5  CL 97 100  --  101  CO2 27  --   --  28  GLUCOSE 132* 132*  --  117*  BUN 9 9  --  5*  CREATININE 0.96 1.10 0.84 0.79  CALCIUM 9.4  --   --  9.0   Liver Function Tests:  Recent Labs Lab 03/23/13 0210  AST 36  ALT 51  ALKPHOS 98  BILITOT 0.8  PROT 9.3*  ALBUMIN 4.0    Recent  Labs Lab 03/23/13 0210  LIPASE 14   No results found for this basename: AMMONIA,  in the last 168 hours CBC:  Recent Labs Lab 03/23/13 0210 03/23/13 0221 03/23/13 0845 03/25/13 0540  WBC 10.5  --  8.6 9.2  HGB 13.9 15.3 12.9* 12.6*  HCT 40.7 45.0 37.8* 36.8*  MCV 86.6  --  87.1 87.2  PLT 204  --  180 222   Cardiac Enzymes: No results found for this basename: CKTOTAL, CKMB, CKMBINDEX, TROPONINI,  in the last 168 hours BNP (last 3 results) No results found for this basename: PROBNP,  in the last 8760 hours CBG: No results found for this basename: GLUCAP,  in the last 168 hours  No results found for this or any previous visit (from the past 240 hour(s)).   Studies: No results found.  Scheduled Meds: . elvitegravir-cobicistat-emtricitabine-tenofovir  1 tablet Oral Q breakfast  . enoxaparin (LOVENOX) injection  40 mg Subcutaneous Q24H  . levofloxacin  750 mg Oral Daily  . loratadine  10 mg Oral Daily  . predniSONE  40 mg Oral BID WC  . sodium chloride  3 mL Intravenous  Q12H  . sulfamethoxazole-trimethoprim  2 tablet Oral Q8H   Continuous Infusions: . dextrose 5 % and 0.9% NaCl 100 mL/hr at 03/25/13 0012    Principal Problem:   CAP (community acquired pneumonia) Active Problems:   Right flank pain   HIV (human immunodeficiency virus infection)    Time spent: 25 minutes.     Travis Wheeler  [facility] Pager [phone]. If 7PM-7AM, please contact night-coverage at [email or web address], password [facility] 03/25/2013, 8:47 AM  LOS: 2 days

## 2013-03-25 NOTE — Progress Notes (Signed)
    [facility]  Date of Admission:  03/23/2013  Antibiotics: levaquin day 2 Bactrim/prednisone day 2  Subjective: Feels better, has not yet walked around  Objective: Temp:  [99.1 F (37.3 C)-100.5 F (38.1 C)] 99.1 F (37.3 C) (06/27 0550) Pulse Rate:  [98-119] 98 (06/27 0550) Resp:  [18] 18 (06/27 0550) BP: (139-152)/(75-86) 139/77 mmHg (06/27 0550) SpO2:  [92 %-100 %] 100 % (06/27 0550)  General: Awake, alert, nad Skin: no rashes Lungs: CTA B Cor: RRR without m/r/g Abdomen: soft, nt, nd, +bs Ext: no edema  Lab Results Lab Results  Component Value Date   WBC 9.2 03/25/2013   HGB 12.6* 03/25/2013   HCT 36.8* 03/25/2013   MCV 87.2 03/25/2013   PLT 222 03/25/2013    Lab Results  Component Value Date   CREATININE 0.79 03/25/2013   BUN 5* 03/25/2013   NA 139 03/25/2013   K 3.5 03/25/2013   CL 101 03/25/2013   CO2 28 03/25/2013    Lab Results  Component Value Date   ALT 51 03/23/2013   AST 36 03/23/2013   ALKPHOS 98 03/23/2013   BILITOT 0.8 03/23/2013      Microbiology: No results found for this or any previous visit (from the past 240 hour(s)).  Studies/Results: No results found.  Assessment/Plan: 1) Pneumonia - community acquired vs PCP pneumonia.  Being treated for both.  Hypoxia and desaturation with walking certainly suggests PCP.  He is clinically improving though can get worse before better.  Continue to observe1-2 days.  Started ARVs and tolerating.    2) HIV - low CD4, appropriate labs sent.    Staci Righter, MD [facility] [email or web address] [phone] pager   [phone] cell 03/25/2013, 1:28 PM

## 2013-03-26 LAB — TOXOPLASMA GONDII ANTIBODY, IGG: Toxoplasma IgG Ratio: <3 IU/mL (ref <7.2)

## 2013-03-26 NOTE — Progress Notes (Signed)
Pt ambulated 1 lap around 4W on RA with O2 sats being monitored. O2 maintained at 95% or above throughout the entire walk. Darchelle Nunes, Lavone Orn, RN

## 2013-03-26 NOTE — Progress Notes (Signed)
[facility] PROGRESS NOTE  Travis Wheeler:[id number] DOB: [birth year] DOA: 03/23/2013 PCP: No primary provider on file.  Assessment/Plan: 1. Community acquired PNA/ PCP PNA: Continue with Levaquin day 4. CD 4 less than 100. Started on  treatment for PCP PNA 6-26. Continue with Bactrim day 3/21. PO2 less than 70. Continue with prednisone 40 mg BID day 3. Monitor for 24 to 48 hours. Check oxygen sat on ambulation.   2. AIDS/HIV: CD4 less than 100. Dr Luciana Axe consulted. Started on STRIBILD. Hepatitis A positive, hepatitis C negative, Hepat B Ab negative, Hepatitis B Core positive, hepatitis B S antigen negative, this probably reflect Immune due to natural infection.  Toxoplasma less than 3.   Code Status: Full  Family Communication: Care discussed with patient.  Disposition Plan: home when stable.    Consultants:  none  Procedures:  None  Antibiotics:  Levaquin  HPI/Subjective: Feeling better. No complaints.   Objective: Filed Vitals:   03/25/13 0550 03/25/13 1450 03/25/13 2035 03/26/13 0442  BP: 139/77 144/75 132/78 139/83  Pulse: 98 111 103 90  Temp: 99.1 F (37.3 C) 99.1 F (37.3 C) 98.1 F (36.7 C) 98.6 F (37 C)  TempSrc: Oral Oral Oral Oral  Resp: 18 18 18 18   Height:      Weight:      SpO2: 100% 97% 99% 98%    Intake/Output Summary (Last 24 hours) at 03/26/13 1223 Last data filed at 03/26/13 0700  Gross per 24 hour  Intake 3021.67 ml  Output      0 ml  Net 3021.67 ml   Filed Weights   03/23/13 0152 03/23/13 0826  Weight: 79.379 kg (175 lb) 79.379 kg (175 lb)    Exam:   General:  No distress.   Cardiovascular: S 1, S 2 RRR  Respiratory: bilateral ronchus  Abdomen: BS present. Soft, NT  Musculoskeletal: no edema.   Data Reviewed: Basic Metabolic Panel:  Recent Labs Lab 03/23/13 0210 03/23/13 0221 03/23/13 0845 03/25/13 0540  NA 134* 139  --  139  K 3.6 3.7  --  3.5  CL 97 100  --  101  CO2 27  --   --  28  GLUCOSE 132* 132*   --  117*  BUN 9 9  --  5*  CREATININE 0.96 1.10 0.84 0.79  CALCIUM 9.4  --   --  9.0   Liver Function Tests:  Recent Labs Lab 03/23/13 0210  AST 36  ALT 51  ALKPHOS 98  BILITOT 0.8  PROT 9.3*  ALBUMIN 4.0    Recent Labs Lab 03/23/13 0210  LIPASE 14   No results found for this basename: AMMONIA,  in the last 168 hours CBC:  Recent Labs Lab 03/23/13 0210 03/23/13 0221 03/23/13 0845 03/25/13 0540  WBC 10.5  --  8.6 9.2  HGB 13.9 15.3 12.9* 12.6*  HCT 40.7 45.0 37.8* 36.8*  MCV 86.6  --  87.1 87.2  PLT 204  --  180 222   Cardiac Enzymes: No results found for this basename: CKTOTAL, CKMB, CKMBINDEX, TROPONINI,  in the last 168 hours BNP (last 3 results) No results found for this basename: PROBNP,  in the last 8760 hours CBG: No results found for this basename: GLUCAP,  in the last 168 hours  No results found for this or any previous visit (from the past 240 hour(s)).   Studies: No results found.  Scheduled Meds: . elvitegravir-cobicistat-emtricitabine-tenofovir  1 tablet Oral Q breakfast  . enoxaparin (LOVENOX)  injection  40 mg Subcutaneous Q24H  . levofloxacin  750 mg Oral Daily  . loratadine  10 mg Oral Daily  . predniSONE  40 mg Oral BID WC  . sodium chloride  3 mL Intravenous Q12H  . sulfamethoxazole-trimethoprim  2 tablet Oral Q8H   Continuous Infusions: . dextrose 5 % and 0.9% NaCl 100 mL/hr at 03/26/13 0350    Principal Problem:   CAP (community acquired pneumonia) Active Problems:   Right flank pain   HIV (human immunodeficiency virus infection)    Time spent: 25 minutes.     Shantal Roan  [facility] Pager [phone]. If 7PM-7AM, please contact night-coverage at [email or web address], password [facility] 03/26/2013, 12:23 PM  LOS: 3 days

## 2013-03-27 LAB — BASIC METABOLIC PANEL
BUN: 10 mg/dL (ref 6–23)
CO2: 26 mEq/L (ref 19–32)
GFR calc non Af Amer: >90 mL/min (ref >90)
Glucose, Bld: 117 mg/dL — ABNORMAL HIGH (ref 70–99)
Potassium: 3.8 mEq/L (ref 3.5–5.1)
Sodium: 137 mEq/L (ref 135–145)

## 2013-03-27 MED ORDER — ELVITEG-COBIC-EMTRICIT-TENOFDF 150-150-200-300 MG PO TABS: 1.0000 | ORAL_TABLET | Freq: Every day | ORAL | Status: DC

## 2013-03-27 MED ORDER — SULFAMETHOXAZOLE-TMP DS 800-160 MG PO TABS: 2.0000 | ORAL_TABLET | Freq: Three times a day (TID) | ORAL | Status: DC

## 2013-03-27 MED ORDER — PREDNISONE 20 MG PO TABS: ORAL_TABLET | ORAL | Status: DC

## 2013-03-27 MED ORDER — HYDROCORTISONE ACETATE 25 MG RE SUPP: 25.0000 mg | Freq: Two times a day (BID) | RECTAL | Status: DC | PRN

## 2013-03-27 MED ORDER — LEVOFLOXACIN 750 MG PO TABS: 750.0000 mg | ORAL_TABLET | Freq: Every day | ORAL | Status: DC

## 2013-03-27 NOTE — Discharge Summary (Signed)
Physician Discharge Summary  Travis Wheeler BJY:[id number] DOB: 1966/04/19 DOA: 03/23/2013  PCP: No primary provider on file.  Admit date: 03/23/2013 Discharge date: 03/27/2013  Time spent: 35 minutes  Recommendations for Outpatient Follow-up:  1. Follow up with Dr Luciana Axe for management of AIDS,HIV.  2. He will need repeat CD-4, Viral load.  3. Follow up on resolution of PNA.   Discharge Diagnoses:    CAP, and presume PCP PNA.    HIV (human immunodeficiency virus infection), AIDS.    Discharge Condition: Improved.  Diet recommendation: Regular diet.   Filed Weights   03/23/13 0152 03/23/13 0826  Weight: 79.379 kg (175 lb) 79.379 kg (175 lb)    History of present illness:  Travis Wheeler is an 47 y.o. male with hx of HIV positive, diagnosed 2009, in denial and not seeking any evaluation for his HIV, presents to ER with right flank pain, minimal cough, and shortness of breath. It has been for 2-3 days. He has some subjective fever, no chills, no chest pain. Evaluation in the ER included a CXR showing bibasilar infiltrates, normal WBC and renal fx tests. He has tachycardia and hypertension in the ER. A CTPA was performed showing no PE, but concern for PNA and non obtructive nephrolithiasis on the left side. He was also desat to 85 percent with ambulation. Hospitalist was asked to admit him for CAP.   Hospital Course:  1. Community acquired PNA/ PCP PNA: Patient recieved 5 days of Levaquin. CD 4 less than 100. Started on treatment for PCP PNA 6-26. Continue with Bactrim day 4/21. PO2 less than 70. Continue with prednisone 40 mg BID day 4. Will provide prescription for prednisone taper over 21 days. Oxygen Sat on ambulation 95 % RA. Will provide prescription for Bactrim total 21 days.   2. AIDS/HIV: CD4 less than 100. Dr Luciana Axe consulted. Started on STRIBILD. Hepatitis A positive, hepatitis C negative, Hepat B Ab negative, Hepatitis B Core positive, hepatitis B S antigen negative, this  probably reflect Immune due to natural infection. Toxoplasma less than 3. He will probably need prophylaxis for PCP after he finish treatment for PCP PNA. CM will make sure patient is able to afford medications.    Procedures:  none  Consultations:  Dr Luciana Axe.   Discharge Exam: Filed Vitals:   03/26/13 0442 03/26/13 1441 03/26/13 2050 03/27/13 0636  BP: 139/83 123/66 122/72 123/75  Pulse: 90 94 99 99  Temp: 98.6 F (37 C) 98.5 F (36.9 C) 98.4 F (36.9 C) 98.6 F (37 C)  TempSrc: Oral Oral Oral Oral  Resp: 18 17 18 18   Height:      Weight:      SpO2: 98% 95% 97% 98%    General: No distress.  Cardiovascular: S 1, S 2 RRR Respiratory: CTA  Discharge Instructions  Discharge Orders   Future Orders Complete By Expires     Diet general  As directed     Increase activity slowly  As directed         Medication List         aspirin EC 81 MG tablet  Take 162 mg by mouth every 6 (six) hours as needed for pain.     cetirizine 10 MG tablet  Commonly known as:  ZYRTEC  Take 10 mg by mouth daily.     elvitegravir-cobicistat-emtricitabine-tenofovir 150-150-200-300 MG Tabs  Commonly known as:  STRIBILD  Take 1 tablet by mouth daily with breakfast.     hydrocortisone 25 MG  suppository  Commonly known as:  ANUSOL-HC  Place 1 suppository (25 mg total) rectally 2 (two) times daily as needed.              predniSONE 20 MG tablet  Commonly known as:  DELTASONE  Take 2 tablet twice a day for for 3 days, then take 2 tablet daily for 5 days then take 1 tablet daily for 11 days.     sulfamethoxazole-trimethoprim 800-160 MG per tablet  Commonly known as:  BACTRIM DS  Take 2 tablets by mouth every 8 (eight) hours.       No Known Allergies     Follow-up Information   Follow up with Katy Apo, MD On 04/14/2013. (New appt for PCP...)    Contact information:   [address] [place] [address] [phone]       Follow up with Staci Righter, MD  In 1 week.   Contact information:   [address] [place] [address] [phone]        The results of significant diagnostics from this hospitalization (including imaging, microbiology, ancillary and laboratory) are listed below for reference.    Significant Diagnostic Studies: Dg Chest 2 View  03/23/2013   *RADIOLOGY REPORT*  Clinical Data: Shortness of breath, fever.  CHEST - 2 VIEW  Comparison: None.  Findings: Bibasilar atelectasis or infiltrates, right greater than left.  Heart is normal size.  No effusions or acute bony abnormality.  IMPRESSION: Bibasilar airspace opacities, right greater than left.  This could represent atelectasis or infiltrates.  Cannot exclude pneumonia, particularly in the right lung base.   Original Report Authenticated By: Charlett Nose, M.D.   Ct Abdomen Pelvis W Contrast  03/23/2013   *RADIOLOGY REPORT*  Clinical Data: Right-sided abdominal pain, fever.  CT ABDOMEN AND PELVIS WITH CONTRAST  Technique:  Multidetector CT imaging of the abdomen and pelvis was performed following the standard protocol during bolus administration of intravenous contrast.  Contrast: OMNIPAQUE IOHEXOL 300 MG/ML  SOLN  Comparison: None.  Findings: Bilateral lower lobe airspace consolidation, right greater than left concerning for pneumonia.  Trace right pleural effusion.  Heart is normal size.  Liver, gallbladder, spleen, pancreas, adrenals have an unremarkable appearance.  Small scattered low density areas within the kidneys, likely small cysts.  8 mm nonobstructing left lower pole renal stone.  No hydronephrosis.  No ureteral stones.  Urinary bladder is unremarkable.  Normal retrocecal appendix.  Moderate gas and stool throughout the colon.  Small bowel is decompressed.  No free fluid, free air or adenopathy.  Aorta is normal caliber.  No acute bony abnormality.  IMPRESSION: Bilateral lower lobe infiltrates, right greater than left concerning for pneumonia.  Trace right  pleural effusion.  Small bilateral renal cysts.  Left nephrolithiasis.  Normal appendix.   Original Report Authenticated By: Charlett Nose, M.D.    Microbiology: No results found for this or any previous visit (from the past 240 hour(s)).   Labs: Basic Metabolic Panel:  Recent Labs Lab 03/23/13 0210 03/23/13 0221 03/23/13 0845 03/25/13 0540 03/27/13 0513  NA 134* 139  --  139 137  K 3.6 3.7  --  3.5 3.8  CL 97 100  --  101 102  CO2 27  --   --  28 26  GLUCOSE 132* 132*  --  117* 117*  BUN 9 9  --  5* 10  CREATININE 0.96 1.10 0.84 0.79 0.87  CALCIUM 9.4  --   --  9.0 9.1   Liver Function Tests:  Recent Labs Lab 03/23/13 0210  AST 36  ALT 51  ALKPHOS 98  BILITOT 0.8  PROT 9.3*  ALBUMIN 4.0    Recent Labs Lab 03/23/13 0210  LIPASE 14   No results found for this basename: AMMONIA,  in the last 168 hours CBC:  Recent Labs Lab 03/23/13 0210 03/23/13 0221 03/23/13 0845 03/25/13 0540  WBC 10.5  --  8.6 9.2  HGB 13.9 15.3 12.9* 12.6*  HCT 40.7 45.0 37.8* 36.8*  MCV 86.6  --  87.1 87.2  PLT 204  --  180 222   Cardiac Enzymes: No results found for this basename: CKTOTAL, CKMB, CKMBINDEX, TROPONINI,  in the last 168 hours BNP: BNP (last 3 results) No results found for this basename: PROBNP,  in the last 8760 hours CBG: No results found for this basename: GLUCAP,  in the last 168 hours     Signed:  Delynda Sepulveda  [facility] 03/27/2013, 10:53 AM

## 2013-03-27 NOTE — Care Management Note (Signed)
   CARE MANAGEMENT NOTE 03/27/2013  Patient:  Travis Wheeler, Travis Wheeler   Account Number:  [id number]  Date Initiated:  03/24/2013  Documentation initiated by:  Ezekiel Ina  Subjective/Objective Assessment:   Pt admitted with cco PNA     Action/Plan:   from home  03/27/2013 Referral for assistance with medications or inquiry into pt insurance for copay informatio.   Anticipated DC Date:  03/27/2013   Anticipated DC Plan:  HOME/SELF CARE      DC Planning Services  CM consult      Choice offered to / List presented to:             Status of service:  Completed, signed off Medicare Important Message given?   (If response is "NO", the following Medicare IM given date fields will be blank) Date Medicare IM given:   Date Additional Medicare IM given:    Discharge Disposition:  HOME/SELF CARE  Per UR Regulation:  Reviewed for med. necessity/level of care/duration of stay  If discussed at Long Length of Stay Meetings, dates discussed:    Comments:  03/27/2013 Referral for assistance with medications and inquiry into pt insurance for copay information. Unfortunately , CM is unable to obtain copay information on a weekend as this is Sunday. Pt has insurance and is not eligible for assistance from CM as he is not indigent additional the [facility] program does not cover HIV medications. Pt nurse informed.   03/24/13 MMcGibboney, RN, BSN Chart reviewed. PCP appointment with Dr. Nehemiah Settle.

## 2013-03-27 NOTE — Progress Notes (Signed)
    [facility]  Date of Admission:  03/23/2013  Antibiotics: levaquin day 4 Bactrim/prednisone day 4  Subjective: Feels better, good walking O2 sat  Objective: Temp:  [98.4 F (36.9 C)-98.6 F (37 C)] 98.6 F (37 C) (06/29 0636) Pulse Rate:  [94-99] 99 (06/29 0636) Resp:  [17-18] 18 (06/29 0636) BP: (122-123)/(66-75) 123/75 mmHg (06/29 0636) SpO2:  [95 %-98 %] 98 % (06/29 0636)  General: Awake, alert, nad Skin: no rashes Lungs: CTA B Cor: RRR without m/r/g Abdomen: soft, nt, nd, +bs Ext: no edema  Lab Results Lab Results  Component Value Date   WBC 9.2 03/25/2013   HGB 12.6* 03/25/2013   HCT 36.8* 03/25/2013   MCV 87.2 03/25/2013   PLT 222 03/25/2013    Lab Results  Component Value Date   CREATININE 0.87 03/27/2013   BUN 10 03/27/2013   NA 137 03/27/2013   K 3.8 03/27/2013   CL 102 03/27/2013   CO2 26 03/27/2013    Lab Results  Component Value Date   ALT 51 03/23/2013   AST 36 03/23/2013   ALKPHOS 98 03/23/2013   BILITOT 0.8 03/23/2013      Microbiology: No results found for this or any previous visit (from the past 240 hour(s)).  Studies/Results: No results found.  Assessment/Plan: 1) Pneumonia - community acquired vs PCP pneumonia.  Will d/c levaquin now.   -continue Bactrim for 21 days total (2 DS tabs q 8 hours).  Prednisone 40 mg bid through July 1, then 40 mg daily for 5 days then 20 mg daily for 11 days.    2) HIV - low CD4, appropriate labs sent.    OK for discharge today from ID standpoint.  May need assistance to check out his insurance to see if he can afford his deductible.  We have copay cards for Stribild once he gets into our office.  Can he get a 1-2 days supply of Stribild?  Or maybe fill prescription before he leaves?  Thanks for assistance.  We will call him this week for appt in our clinic ASAP.    Staci Righter, MD [facility] [email or web address] [phone]  pager   [phone] cell 03/27/2013, 10:25 AM

## 2013-03-28 ENCOUNTER — Telehealth: Payer: Self-pay | Admitting: *Deleted

## 2013-03-28 NOTE — Telephone Encounter (Signed)
Pharmacist from [facility] called regarding patient's prescription for Bactrim DS, double checking the dosage.  Rx written for patient to take 2 tablets Q 8hrs with 102 tablets in total.  Rx written during hospitalization by Dr. Sunnie Nielsen, MD. Pt has follow up with Dr. Luciana Axe 03/31/13.  Pharmacist would just like to know if this dosage is valid.

## 2013-03-28 NOTE — Telephone Encounter (Signed)
Called [employer] and applied for [employer]. He has been approved for the grant. He is eligible until June 26. 2015.

## 2013-03-29 LAB — GLUCOSE 6 PHOSPHATE DEHYDROGENASE: G6PDH: 16.5 U/g Hgb (ref 7.0–20.5)

## 2013-03-29 NOTE — Telephone Encounter (Signed)
That sounds right, it is for PCP pneumonia

## 2013-03-30 ENCOUNTER — Ambulatory Visit (INDEPENDENT_AMBULATORY_CARE_PROVIDER_SITE_OTHER): Payer: No Typology Code available for payment source | Admitting: Internal Medicine

## 2013-03-30 ENCOUNTER — Inpatient Hospital Stay: Payer: No Typology Code available for payment source | Admitting: Internal Medicine

## 2013-03-30 ENCOUNTER — Encounter: Payer: Self-pay | Admitting: *Deleted

## 2013-03-30 ENCOUNTER — Encounter: Payer: Self-pay | Admitting: Internal Medicine

## 2013-03-30 VITALS — BP 151/89 | HR 106 | Temp 98.5°F | Ht 71.0 in | Wt 174.0 lb

## 2013-03-30 DIAGNOSIS — B59 Pneumocystosis: Secondary | ICD-10-CM

## 2013-03-30 DIAGNOSIS — Z21 Asymptomatic human immunodeficiency virus [HIV] infection status: Secondary | ICD-10-CM

## 2013-03-30 DIAGNOSIS — B20 Human immunodeficiency virus [HIV] disease: Secondary | ICD-10-CM

## 2013-03-30 LAB — QUANTIFERON TB GOLD ASSAY (BLOOD): Quantiferon Nil Value: 0.04 IU/mL

## 2013-03-30 LAB — HIV-1 GENOTYPR PLUS: Resistance Assoc RT Mutations: NOT DETECTED

## 2013-03-31 ENCOUNTER — Encounter: Payer: Self-pay | Admitting: *Deleted

## 2013-03-31 ENCOUNTER — Encounter: Payer: Self-pay | Admitting: Internal Medicine

## 2013-03-31 DIAGNOSIS — B59 Pneumocystosis: Secondary | ICD-10-CM | POA: Insufficient documentation

## 2013-03-31 NOTE — Assessment & Plan Note (Signed)
She is on Stribild but did have problems filling it but has worked with our financial person here and will go refill it today. We'll then check his labs in about 3 weeks

## 2013-03-31 NOTE — Assessment & Plan Note (Signed)
He continues on his therapy for the pneumonia. I did go over the medications and help him with his pillbox. He will continue to complete the 21 day course.  He was also given a note for reduced work hours at work.

## 2013-03-31 NOTE — Progress Notes (Signed)
  Subjective:    Patient ID: Travis Wheeler, male    DOB: 09-10-1966, 47 y.o.   MRN: [id number]  HPI Comes in for hospital followup for PCP pneumonia. He was diagnosed HIV in 2009 however never sought out care. He tells me he had not felt he could afford the medications. He then had significant shortness of breath and presented to the emergency room and x-ray showed bibasilar opacities. He was also notably hypoxic. He was started on treatment for community card pneumonia as well as PCP pneumonia. He did finish course of Levaquin for pneumonia and is currently taking Bactrim and prednisone for PCP pneumonia. He feels much better since his initial hospitalization and no longer short of breath. No cough. He was started by me on Stribild though do to unknown issues, he has not been able to fill that after his hospitalization. 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  He does having no weight loss, diarrhea or no new issues since leaving the hospital.   Review of Systems  Constitutional: Negative for fever, chills, fatigue and unexpected weight change.  HENT: Negative for sore throat and trouble swallowing.   Respiratory: Negative for cough, shortness of breath and wheezing.   Cardiovascular: Negative for leg swelling.  Gastrointestinal: Negative for nausea, abdominal pain and diarrhea.  Musculoskeletal: Negative for myalgias and arthralgias.  Skin: Negative for rash.  Neurological: Negative for dizziness, light-headedness and headaches.  Hematological: Negative for adenopathy.       Objective:   Physical Exam  Constitutional: He is oriented to person, place, and time. He appears well-developed and well-nourished. No distress.  HENT:  Mouth/Throat: Oropharynx is clear and moist. No oropharyngeal exudate.  Eyes: Right eye exhibits no discharge. Left eye exhibits no discharge. No scleral icterus.  Cardiovascular:  Normal rate, regular rhythm and normal heart sounds.   No murmur heard. Pulmonary/Chest: Effort normal and breath sounds normal. No respiratory distress. He has no wheezes.  Abdominal: Soft. Bowel sounds are normal. He exhibits no distension. There is no tenderness. There is no rebound.  Genitourinary: Penis normal.  Lymphadenopathy:    He has no cervical adenopathy.  Neurological: He is alert and oriented to person, place, and time.  Skin: Skin is warm and dry. No rash noted.  Psychiatric: He has a normal mood and affect. His behavior is normal.          Assessment & Plan:

## 2013-04-05 ENCOUNTER — Telehealth: Payer: Self-pay | Admitting: *Deleted

## 2013-04-05 NOTE — Telephone Encounter (Signed)
Patient came into clinic stating that since starting back to work he is having trouble taking the bactrim and prednisone. It is making him nauseous and he vomiting after taking it yesterday. He was not previously having this problem, but may be related to his change in schedule. He is almost done with the prednisone, but still has a full bottle of the bactrim. Please advise Travis Wheeler

## 2013-04-06 ENCOUNTER — Telehealth: Payer: Self-pay | Admitting: *Deleted

## 2013-04-06 NOTE — Telephone Encounter (Signed)
Talked with Travis Wheeler.  Travis Wheeler had called Monday after I left for the day.  He was having some problems with getting his medication.  I called and left him a V/M asking that he return my call and let us know if he was able to get his Stribild.

## 2013-04-11 NOTE — Telephone Encounter (Signed)
Patient notified Jacqueline Cockerham  

## 2013-04-11 NOTE — Telephone Encounter (Signed)
Once he finishes the prednisone, he just needs one Bactrim daily

## 2013-04-14 ENCOUNTER — Other Ambulatory Visit: Payer: No Typology Code available for payment source

## 2013-04-20 ENCOUNTER — Other Ambulatory Visit: Payer: No Typology Code available for payment source

## 2013-04-28 ENCOUNTER — Ambulatory Visit (INDEPENDENT_AMBULATORY_CARE_PROVIDER_SITE_OTHER): Payer: No Typology Code available for payment source | Admitting: Internal Medicine

## 2013-04-28 ENCOUNTER — Encounter: Payer: Self-pay | Admitting: Internal Medicine

## 2013-04-28 VITALS — BP 158/91 | HR 111 | Temp 98.8°F | Ht 71.0 in | Wt 179.0 lb

## 2013-04-28 DIAGNOSIS — Z23 Encounter for immunization: Secondary | ICD-10-CM

## 2013-04-28 DIAGNOSIS — R21 Rash and other nonspecific skin eruption: Secondary | ICD-10-CM | POA: Insufficient documentation

## 2013-04-28 DIAGNOSIS — Z21 Asymptomatic human immunodeficiency virus [HIV] infection status: Secondary | ICD-10-CM

## 2013-04-28 DIAGNOSIS — B20 Human immunodeficiency virus [HIV] disease: Secondary | ICD-10-CM

## 2013-04-28 DIAGNOSIS — B59 Pneumocystosis: Secondary | ICD-10-CM

## 2013-04-28 DIAGNOSIS — K649 Unspecified hemorrhoids: Secondary | ICD-10-CM

## 2013-04-28 LAB — COMPLETE METABOLIC PANEL WITH GFR
ALT: 32 U/L (ref 0–53)
Albumin: 4.3 g/dL (ref 3.5–5.2)
CO2: 28 mEq/L (ref 19–32)
Calcium: 9.2 mg/dL (ref 8.4–10.5)
Chloride: 100 mEq/L (ref 96–112)
GFR, Est African American: 77 mL/min
GFR, Est Non African American: 66 mL/min
Glucose, Bld: 102 mg/dL — ABNORMAL HIGH (ref 70–99)
Potassium: 3.7 mEq/L (ref 3.5–5.3)
Sodium: 136 mEq/L (ref 135–145)
Total Protein: 6.9 g/dL (ref 6.0–8.3)

## 2013-04-28 LAB — CBC WITH DIFFERENTIAL/PLATELET
Basophils Absolute: 0 10*3/uL (ref 0.0–0.1)
Basophils Relative: 0 % (ref 0–1)
Eosinophils Absolute: 0 10*3/uL (ref 0.0–0.7)
Hemoglobin: 13.1 g/dL (ref 13.0–17.0)
MCH: 30.2 pg (ref 26.0–34.0)
MCHC: 35.2 g/dL (ref 30.0–36.0)
Monocytes Relative: 10 % (ref 3–12)
Neutro Abs: 2.1 10*3/uL (ref 1.7–7.7)
Neutrophils Relative %: 48 % (ref 43–77)
RDW: 15.2 % (ref 11.5–15.5)

## 2013-04-28 MED ORDER — SULFAMETHOXAZOLE-TMP DS 800-160 MG PO TABS: 1.0000 | ORAL_TABLET | Freq: Every day | ORAL | Status: DC

## 2013-04-28 NOTE — Assessment & Plan Note (Signed)
This has resolved and he will continue with daily prophylaxis

## 2013-04-28 NOTE — Addendum Note (Signed)
Addended by: Wendall Mola A on: 04/28/2013 12:12 PM   Modules accepted: Orders

## 2013-04-28 NOTE — Assessment & Plan Note (Signed)
I will check his labs today and if there are any concerns he will be called otherwise I will see him in 2 months

## 2013-04-28 NOTE — Progress Notes (Signed)
  Subjective:    Patient ID: RC AMISON, male    DOB: [birth year], 47 y.o.   MRN: [id number]  HPI He comes in for routine followup. He was seen earlier this month as a new visit after going emergency room and been treated for presumed PCP pneumonia. After some difficulty getting medicine, he did get it and has been on Stribild for one month. He feels well but does have some fatigue. He is no longer having shortness of breath or cough. He denies any missed doses since starting. He is pleased with the regimen and no longer has nausea or vomiting. No weight loss or diarrhea.  He does have a finding on his perirectal area.   Review of Systems  Constitutional: Negative for fever, fatigue and unexpected weight change.  HENT: Negative for sore throat and trouble swallowing.   Eyes: Negative for visual disturbance.  Respiratory: Negative for cough and shortness of breath.   Cardiovascular: Negative for leg swelling.  Gastrointestinal: Negative for nausea, abdominal pain and diarrhea.  Musculoskeletal: Negative for myalgias and arthralgias.  Skin: Negative for rash.  Neurological: Negative for dizziness, light-headedness and headaches.  Hematological: Negative for adenopathy.  Psychiatric/Behavioral: Negative for dysphoric mood.       Objective:   Physical Exam  Constitutional: He is oriented to person, place, and time. He appears well-developed and well-nourished. No distress.  HENT:  Mouth/Throat: Oropharynx is clear and moist. No oropharyngeal exudate.  Eyes: Right eye exhibits no discharge. Left eye exhibits no discharge. No scleral icterus.  Cardiovascular: Normal rate, regular rhythm and normal heart sounds.   No murmur heard. Pulmonary/Chest: Effort normal and breath sounds normal. No respiratory distress. He has no wheezes.  Lymphadenopathy:    He has no cervical adenopathy.  Neurological: He is alert and oriented to person, place, and time.  Skin: Skin is warm and dry. No rash  noted.  Psychiatric: He has a normal mood and affect. His behavior is normal.          Assessment & Plan:

## 2013-04-28 NOTE — Assessment & Plan Note (Signed)
I am not sure if this is a hemorrhoid or not since it is not contiguous with anus.  He is on medicine and will be monitored.

## 2013-04-29 LAB — HIV-1 RNA QUANT-NO REFLEX-BLD: HIV-1 RNA Quant, Log: 1.68 {Log} — ABNORMAL HIGH (ref <1.30)

## 2013-04-29 LAB — T-HELPER CELL (CD4) - (RCID CLINIC ONLY)
CD4 % Helper T Cell: 13 % — ABNORMAL LOW (ref 33–55)
CD4 T Cell Abs: 240 uL — ABNORMAL LOW (ref 400–2700)

## 2013-06-03 ENCOUNTER — Other Ambulatory Visit: Payer: Self-pay | Admitting: Licensed Clinical Social Worker

## 2013-06-03 DIAGNOSIS — B20 Human immunodeficiency virus [HIV] disease: Secondary | ICD-10-CM

## 2013-06-03 MED ORDER — ELVITEG-COBIC-EMTRICIT-TENOFDF 150-150-200-300 MG PO TABS: 1.0000 | ORAL_TABLET | Freq: Every day | ORAL | Status: DC

## 2013-06-30 ENCOUNTER — Ambulatory Visit (INDEPENDENT_AMBULATORY_CARE_PROVIDER_SITE_OTHER): Payer: No Typology Code available for payment source | Admitting: Internal Medicine

## 2013-06-30 ENCOUNTER — Encounter: Payer: Self-pay | Admitting: Internal Medicine

## 2013-06-30 VITALS — BP 125/77 | HR 88 | Temp 98.4°F | Ht 71.0 in | Wt 177.0 lb

## 2013-06-30 DIAGNOSIS — Z21 Asymptomatic human immunodeficiency virus [HIV] infection status: Secondary | ICD-10-CM

## 2013-06-30 DIAGNOSIS — K649 Unspecified hemorrhoids: Secondary | ICD-10-CM

## 2013-06-30 DIAGNOSIS — Z23 Encounter for immunization: Secondary | ICD-10-CM

## 2013-06-30 DIAGNOSIS — L909 Atrophic disorder of skin, unspecified: Secondary | ICD-10-CM

## 2013-06-30 DIAGNOSIS — B20 Human immunodeficiency virus [HIV] disease: Secondary | ICD-10-CM

## 2013-06-30 DIAGNOSIS — L918 Other hypertrophic disorders of the skin: Secondary | ICD-10-CM

## 2013-06-30 NOTE — Progress Notes (Signed)
  Subjective:    Patient ID: Travis Wheeler, male    DOB: [birth year], 47 y.o.   MRN: [id number]  HPI  He comes in for routine followup. He was seen earlier this year as a new visit after going emergency room and been treated for presumed PCP pneumonia. After some difficulty getting medicine, he did get it and has been on Stribild which he has been taking since June.  He is no longer having shortness of breath or cough. He denies any missed doses since starting. He is pleased with the regimen and no longer has nausea or vomiting. No weight loss or diarrhea.  He continues to have problems with his skin in the upper part of his gluteal fold with a lesion that bleeds and itches.  His primary doctor suggested a and D. Ointment which does help.   Review of Systems  Constitutional: Negative for fever, fatigue and unexpected weight change.  HENT: Negative for sore throat and trouble swallowing.   Eyes: Negative for visual disturbance.  Respiratory: Negative for cough and shortness of breath.   Cardiovascular: Negative for leg swelling.  Gastrointestinal: Negative for nausea, abdominal pain and diarrhea.  Musculoskeletal: Negative for myalgias and arthralgias.  Skin: Negative for rash.  Neurological: Negative for dizziness, light-headedness and headaches.  Hematological: Negative for adenopathy.  Psychiatric/Behavioral: Negative for dysphoric mood.       Objective:   Physical Exam  Constitutional: He is oriented to person, place, and time. He appears well-developed and well-nourished. No distress.  HENT:  Mouth/Throat: Oropharynx is clear and moist. No oropharyngeal exudate.  Eyes: Right eye exhibits no discharge. Left eye exhibits no discharge. No scleral icterus.  Cardiovascular: Normal rate, regular rhythm and normal heart sounds.   No murmur heard. Pulmonary/Chest: Effort normal and breath sounds normal. No respiratory distress. He has no wheezes.  Lymphadenopathy:    He has no cervical  adenopathy.  Neurological: He is alert and oriented to person, place, and time.  Skin: Skin is warm and dry.  He does have a lesion on the upper part of his gluteal fold on the left side that is denuded skin  Psychiatric: He has a normal mood and affect. His behavior is normal.          Assessment & Plan:

## 2013-06-30 NOTE — Assessment & Plan Note (Signed)
His lesion does not appear consistent with hemorrhoid since it is not around the anus. I will have him referred to dermatologist for evaluation since he continues to be concerned and bothered by this.

## 2013-06-30 NOTE — Assessment & Plan Note (Signed)
He has been doing well and denies any missed doses. I will check his labs today and he'll be called if there are any concerns. He has stopped his Bactrim and if his CD4 count is over 200 that is okay. He will return in 3 months if there are no problems.

## 2013-07-01 LAB — T-HELPER CELL (CD4) - (RCID CLINIC ONLY): CD4 T Cell Abs: 180 /uL — ABNORMAL LOW (ref 400–2700)

## 2013-07-06 ENCOUNTER — Telehealth: Payer: Self-pay | Admitting: *Deleted

## 2013-07-06 NOTE — Telephone Encounter (Signed)
Referral made to [facility]. Appt 11/12 at 9:30, pt to arrive at 9:15.  RN faxed last office note and labs, demographics, insurance info to Roxan Hockey at [phone]. Pt can call their office to reschedule/look for an earlier cancellation [phone].  Left message with this information on the patient's voicemail. Andree Coss, RN

## 2013-07-21 ENCOUNTER — Encounter: Payer: Self-pay | Admitting: Licensed Clinical Social Worker

## 2013-10-06 ENCOUNTER — Other Ambulatory Visit: Payer: No Typology Code available for payment source

## 2013-10-19 ENCOUNTER — Telehealth: Payer: Self-pay | Admitting: *Deleted

## 2013-10-19 NOTE — Telephone Encounter (Signed)
Patient needs to speak with Pam re: [employer] grant for his prescriptions.  Transferred to her voicemail.  Patient was scheduled for 1/22, but needs to reschedule due to work constraints.  Will contact us once he knows his work schedule. RN reminded him it is important to keep regular appointments. Landis Gandy, RN

## 2013-10-20 ENCOUNTER — Ambulatory Visit: Payer: No Typology Code available for payment source | Admitting: Internal Medicine

## 2013-10-24 ENCOUNTER — Telehealth: Payer: Self-pay | Admitting: *Deleted

## 2013-10-24 NOTE — Telephone Encounter (Signed)
Called and has [place] card re-activated.

## 2013-12-27 ENCOUNTER — Other Ambulatory Visit: Payer: No Typology Code available for payment source

## 2013-12-27 DIAGNOSIS — B20 Human immunodeficiency virus [HIV] disease: Secondary | ICD-10-CM

## 2013-12-27 LAB — COMPLETE METABOLIC PANEL WITH GFR
ALBUMIN: 4.6 g/dL (ref 3.5–5.2)
ALK PHOS: 137 U/L — AB (ref 39–117)
ALT: 35 U/L (ref 0–53)
AST: 24 U/L (ref 0–37)
BUN: 9 mg/dL (ref 6–23)
CO2: 28 mEq/L (ref 19–32)
Calcium: 9.6 mg/dL (ref 8.4–10.5)
Chloride: 103 mEq/L (ref 96–112)
Creat: 1.04 mg/dL (ref 0.50–1.35)
GFR, Est African American: >89 mL/min
GFR, Est Non African American: 85 mL/min
GLUCOSE: 117 mg/dL — AB (ref 70–99)
POTASSIUM: 3.6 meq/L (ref 3.5–5.3)
SODIUM: 140 meq/L (ref 135–145)
TOTAL PROTEIN: 7.4 g/dL (ref 6.0–8.3)
Total Bilirubin: 0.6 mg/dL (ref 0.2–1.2)

## 2013-12-27 LAB — CBC
HCT: 40.9 % (ref 39.0–52.0)
HEMOGLOBIN: 14.2 g/dL (ref 13.0–17.0)
MCH: 30.8 pg (ref 26.0–34.0)
MCHC: 34.7 g/dL (ref 30.0–36.0)
MCV: 88.7 fL (ref 78.0–100.0)
Platelets: 266 10*3/uL (ref 150–400)
RBC: 4.61 MIL/uL (ref 4.22–5.81)
RDW: 13.4 % (ref 11.5–15.5)
WBC: 3.4 10*3/uL — ABNORMAL LOW (ref 4.0–10.5)

## 2013-12-28 LAB — T-HELPER CELL (CD4) - (RCID CLINIC ONLY)
CD4 T CELL HELPER: 17 % — AB (ref 33–55)
CD4 T Cell Abs: 270 /uL — ABNORMAL LOW (ref 400–2700)

## 2013-12-28 LAB — HIV-1 RNA QUANT-NO REFLEX-BLD: HIV-1 RNA Quant, Log: <1.3 {Log} (ref <1.30)

## 2014-01-17 ENCOUNTER — Encounter: Payer: Self-pay | Admitting: Internal Medicine

## 2014-01-17 ENCOUNTER — Telehealth: Payer: Self-pay | Admitting: *Deleted

## 2014-01-17 ENCOUNTER — Ambulatory Visit (INDEPENDENT_AMBULATORY_CARE_PROVIDER_SITE_OTHER): Payer: Managed Care, Other (non HMO) | Admitting: Internal Medicine

## 2014-01-17 ENCOUNTER — Ambulatory Visit: Payer: No Typology Code available for payment source | Admitting: Internal Medicine

## 2014-01-17 VITALS — BP 134/78 | HR 86 | Temp 98.5°F | Ht 71.0 in | Wt 180.0 lb

## 2014-01-17 DIAGNOSIS — R21 Rash and other nonspecific skin eruption: Secondary | ICD-10-CM

## 2014-01-17 DIAGNOSIS — Z21 Asymptomatic human immunodeficiency virus [HIV] infection status: Secondary | ICD-10-CM

## 2014-01-17 DIAGNOSIS — B20 Human immunodeficiency virus [HIV] disease: Secondary | ICD-10-CM

## 2014-01-17 MED ORDER — ELVITEG-COBIC-EMTRICIT-TENOFDF 150-150-200-300 MG PO TABS: 1.0000 | ORAL_TABLET | Freq: Every day | ORAL | Status: DC

## 2014-01-17 MED ORDER — CETIRIZINE HCL 10 MG PO TABS: 10.0000 mg | ORAL_TABLET | Freq: Every day | ORAL | Status: DC

## 2014-01-17 NOTE — Progress Notes (Signed)
  Subjective:    Patient ID: Travis Wheeler, male    DOB: 1966/04/02, 48 y.o.   MRN: [id number]  HPI  He comes in for routine followup. He was seen last year as a new visit after going emergency room and been treated for presumed PCP pneumonia. After some difficulty getting medicine, he did get it and has been on Stribild which he has been taking for about 1 year.  He denies any missed doses since starting. He is pleased with the regimen with no nausea or vomiting. No weight loss or diarrhea.  He continues to have problems with his skin in the upper part of his gluteal fold with a lesion that bleeds and itches.  He has not yet been to dermatology.     Review of Systems  Constitutional: Negative for fever, fatigue and unexpected weight change.  HENT: Negative for sore throat and trouble swallowing.   Eyes: Negative for visual disturbance.  Respiratory: Negative for cough and shortness of breath.   Cardiovascular: Negative for leg swelling.  Gastrointestinal: Negative for nausea, abdominal pain and diarrhea.  Musculoskeletal: Negative for arthralgias and myalgias.  Skin: Negative for rash.  Neurological: Negative for dizziness, light-headedness and headaches.  Hematological: Negative for adenopathy.  Psychiatric/Behavioral: Negative for dysphoric mood.       Objective:   Physical Exam  Constitutional: He is oriented to person, place, and time. He appears well-developed and well-nourished. No distress.  HENT:  Mouth/Throat: Oropharynx is clear and moist. No oropharyngeal exudate.  Eyes: Right eye exhibits no discharge. Left eye exhibits no discharge. No scleral icterus.  Cardiovascular: Normal rate, regular rhythm and normal heart sounds.   No murmur heard. Pulmonary/Chest: Effort normal and breath sounds normal. No respiratory distress. He has no wheezes.  Lymphadenopathy:    He has no cervical adenopathy.  Neurological: He is alert and oriented to person, place, and time.  Skin:  Skin is warm and dry.  He does have a lesion on the upper part of his gluteal fold on the left side that is denuded skin  Psychiatric: He has a normal mood and affect. His behavior is normal.          Assessment & Plan:

## 2014-01-17 NOTE — Telephone Encounter (Signed)
Patient scheduled with Dr Allyn Kenner for Monday 4/27 at 12:00.  Pt accepted appointment, given contact information.  Last office note faxed to Dr. Nevada Crane - [phone]. Landis Gandy, RN

## 2014-01-17 NOTE — Assessment & Plan Note (Signed)
Will refer to dermatology

## 2014-01-17 NOTE — Assessment & Plan Note (Signed)
Doing well,  CD4 over 200 and no need for Bactrim prophylaxis.    RTC 4 months.

## 2014-02-16 ENCOUNTER — Encounter (INDEPENDENT_AMBULATORY_CARE_PROVIDER_SITE_OTHER): Payer: No Typology Code available for payment source | Admitting: General Surgery

## 2014-02-17 ENCOUNTER — Ambulatory Visit (INDEPENDENT_AMBULATORY_CARE_PROVIDER_SITE_OTHER): Payer: Managed Care, Other (non HMO) | Admitting: General Surgery

## 2014-02-17 ENCOUNTER — Encounter (INDEPENDENT_AMBULATORY_CARE_PROVIDER_SITE_OTHER): Payer: Self-pay | Admitting: General Surgery

## 2014-02-17 VITALS — BP 132/76 | HR 77 | Temp 98.0°F | Resp 16 | Ht 70.0 in | Wt 176.0 lb

## 2014-02-17 DIAGNOSIS — K644 Residual hemorrhoidal skin tags: Secondary | ICD-10-CM

## 2014-02-17 NOTE — Progress Notes (Signed)
Patient ID: Travis Wheeler, male   DOB: 08/15/1966, 48 y.o.   MRN: [id number]  Chief Complaint  Patient presents with  . rectal abscess    HPI Travis Wheeler is a 48 y.o. male.  Pt is a 48 y/o M who referred by Dr. Polite for an evaluation of a perirectal asbcess vs. Hemorrhoid.  Pt states its been there for several months and has pain with BMs.  He has some blood come from the area.  He takes Hem supp and feels that helps at times.  HPI  Past Medical History  Diagnosis Date  . Kidney stones   . HIV infection     History reviewed. No pertinent past surgical history.  History reviewed. No pertinent family history.  Social History History  Substance Use Topics  . Smoking status: Never Smoker   . Smokeless tobacco: Never Used  . Alcohol Use: Yes     Comment: Socially    No Known Allergies  Current Outpatient Prescriptions  Medication Sig Dispense Refill  . diphenhydrAMINE (SOMINEX) 25 MG tablet Take 25 mg by mouth at bedtime as needed for sleep.      . elvitegravir-cobicistat-emtricitabine-tenofovir (STRIBILD) 150-150-200-300 MG TABS tablet Take 1 tablet by mouth daily.  30 tablet  11   No current facility-administered medications for this visit.    Review of Systems Review of Systems  Constitutional: Negative.   HENT: Negative.   Eyes: Negative.   Respiratory: Negative.   Cardiovascular: Negative.   Gastrointestinal: Negative.   Endocrine: Negative.   Neurological: Negative.     Blood pressure 132/76, pulse 77, temperature 98 F (36.7 C), resp. rate 16, height 5' 10" (1.778 m), weight 176 lb (79.833 kg).  Physical Exam Physical Exam  Constitutional: He is oriented to person, place, and time. He appears well-developed and well-nourished.  HENT:  Head: Normocephalic and atraumatic.  Eyes: Conjunctivae and EOM are normal. Pupils are equal, round, and reactive to light.  Neck: Normal range of motion. Neck supple.  Cardiovascular: Normal rate, regular rhythm and  normal heart sounds.   Pulmonary/Chest: Effort normal and breath sounds normal.  Abdominal: Soft. Bowel sounds are normal.  Genitourinary:     Small anal tag, ttp  Musculoskeletal: Normal range of motion.  Neurological: He is alert and oriented to person, place, and time.  Skin: Skin is warm and dry.    Data Reviewed none  Assessment    48 y/o M with external hemorrhoid at 6:00 vs. Anal condyloma vs. Anal mass     Plan    1. To OR for EUA, exicision of perianal mass. 2. I d/w the patient the risks and benefits of the proc to include but not limited to: infection, bleeding, damage to surround structures, posisble recurrence.  The patient voiced understanding and wishes to proceed.        Travis Wheeler 02/17/2014, 2:23 PM    

## 2014-03-08 ENCOUNTER — Encounter (HOSPITAL_COMMUNITY): Payer: Self-pay | Admitting: Pharmacy Technician

## 2014-03-13 ENCOUNTER — Inpatient Hospital Stay (HOSPITAL_COMMUNITY): Admission: RE | Admit: 2014-03-13 | Payer: No Typology Code available for payment source | Source: Ambulatory Visit

## 2014-03-16 ENCOUNTER — Encounter (HOSPITAL_COMMUNITY)
Admission: RE | Admit: 2014-03-16 | Discharge: 2014-03-16 | Disposition: A | Discharge status: Home / Self Care | Payer: Managed Care, Other (non HMO) | Source: Ambulatory Visit | Attending: General Surgery | Admitting: General Surgery

## 2014-03-16 ENCOUNTER — Encounter (HOSPITAL_COMMUNITY): Payer: Self-pay

## 2014-03-16 DIAGNOSIS — Z01812 Encounter for preprocedural laboratory examination: Secondary | ICD-10-CM | POA: Diagnosis not present

## 2014-03-16 DIAGNOSIS — K612 Anorectal abscess: Secondary | ICD-10-CM | POA: Diagnosis present

## 2014-03-16 DIAGNOSIS — K6282 Dysplasia of anus: Secondary | ICD-10-CM | POA: Diagnosis not present

## 2014-03-16 DIAGNOSIS — Z21 Asymptomatic human immunodeficiency virus [HIV] infection status: Secondary | ICD-10-CM | POA: Diagnosis not present

## 2014-03-16 DIAGNOSIS — Z79899 Other long term (current) drug therapy: Secondary | ICD-10-CM | POA: Diagnosis not present

## 2014-03-16 DIAGNOSIS — A630 Anogenital (venereal) warts: Secondary | ICD-10-CM | POA: Diagnosis not present

## 2014-03-16 LAB — COMPREHENSIVE METABOLIC PANEL
ALT: 31 U/L (ref 0–53)
AST: 24 U/L (ref 0–37)
Albumin: 4.2 g/dL (ref 3.5–5.2)
Alkaline Phosphatase: 149 U/L — ABNORMAL HIGH (ref 39–117)
BUN: 7 mg/dL (ref 6–23)
CO2: 26 mEq/L (ref 19–32)
CREATININE: 1.07 mg/dL (ref 0.50–1.35)
Calcium: 9.5 mg/dL (ref 8.4–10.5)
Chloride: 102 mEq/L (ref 96–112)
GFR calc Af Amer: >90 mL/min (ref >90)
GFR calc non Af Amer: 80 mL/min — ABNORMAL LOW (ref >90)
GLUCOSE: 93 mg/dL (ref 70–99)
POTASSIUM: 3.7 meq/L (ref 3.7–5.3)
Sodium: 141 mEq/L (ref 137–147)
TOTAL PROTEIN: 7.8 g/dL (ref 6.0–8.3)
Total Bilirubin: 0.4 mg/dL (ref 0.3–1.2)

## 2014-03-16 LAB — CBC
HEMATOCRIT: 38.8 % — AB (ref 39.0–52.0)
Hemoglobin: 13.5 g/dL (ref 13.0–17.0)
MCH: 30.5 pg (ref 26.0–34.0)
MCHC: 34.8 g/dL (ref 30.0–36.0)
MCV: 87.8 fL (ref 78.0–100.0)
Platelets: 235 10*3/uL (ref 150–400)
RBC: 4.42 MIL/uL (ref 4.22–5.81)
RDW: 12.3 % (ref 11.5–15.5)
WBC: 3.6 10*3/uL — AB (ref 4.0–10.5)

## 2014-03-16 MED ORDER — CHLORHEXIDINE GLUCONATE 4 % EX LIQD
1.0000 "application " | Freq: Once | CUTANEOUS | Status: DC
  Filled 2014-03-16: qty 15

## 2014-03-16 MED ORDER — CEFAZOLIN SODIUM-DEXTROSE 2-3 GM-% IV SOLR
2.0000 g | INTRAVENOUS | Status: AC
  Administered 2014-03-17: 2 g via INTRAVENOUS
  Filled 2014-03-16: qty 50

## 2014-03-16 NOTE — Pre-Procedure Instructions (Signed)
Travis Wheeler  03/16/2014   Your procedure is scheduled on:  Friday, June 19  Report to [facility] Main Entrance "A" at 5:30 AM.  Call this number if you have problems the morning of surgery: [phone]   Remember:   Do not eat food or drink liquids after midnight.   Take these medicines the morning of surgery with A SIP OF WATER: none   Do not wear jewelry, make-up or nail polish.  Do not wear lotions, powders, or perfumes. You may wear deodorant.  Do not shave 48 hours prior to surgery. Men may shave face and neck.  Do not bring valuables to the hospital.  [facility] is not responsible  for any belongings or valuables.               Contacts, dentures or bridgework may not be worn into surgery.  Leave suitcase in the car. After surgery it may be brought to your room.  For patients admitted to the hospital, discharge time is determined by your   treatment team.               Patients discharged the day of surgery will not be allowed to drive home.  Name and phone number of your driver: greg donnell  Special Instructions: review preparing for surgery handout   Please read over the following fact sheets that you were given: Pain Booklet, Coughing and Deep Breathing and Surgical Site Infection Prevention

## 2014-03-17 ENCOUNTER — Encounter (HOSPITAL_COMMUNITY)
Admission: RE | Disposition: A | Discharge status: Home / Self Care | Payer: Self-pay | Source: Ambulatory Visit | Attending: General Surgery

## 2014-03-17 ENCOUNTER — Ambulatory Visit (HOSPITAL_COMMUNITY)
Admission: RE | Admit: 2014-03-17 | Discharge: 2014-03-17 | Disposition: A | Discharge status: Home / Self Care | Payer: Managed Care, Other (non HMO) | Source: Ambulatory Visit | Attending: General Surgery | Admitting: General Surgery

## 2014-03-17 ENCOUNTER — Ambulatory Visit (HOSPITAL_COMMUNITY): Payer: Managed Care, Other (non HMO) | Admitting: Certified Registered Nurse Anesthetist

## 2014-03-17 ENCOUNTER — Encounter (HOSPITAL_COMMUNITY): Payer: Self-pay | Admitting: Surgery

## 2014-03-17 ENCOUNTER — Ambulatory Visit (HOSPITAL_COMMUNITY)
Admission: RE | Admit: 2014-03-17 | Payer: No Typology Code available for payment source | Source: Ambulatory Visit | Admitting: General Surgery

## 2014-03-17 ENCOUNTER — Encounter (HOSPITAL_COMMUNITY): Payer: Managed Care, Other (non HMO) | Admitting: Certified Registered Nurse Anesthetist

## 2014-03-17 ENCOUNTER — Encounter (HOSPITAL_COMMUNITY): Admission: RE | Payer: Self-pay | Source: Ambulatory Visit

## 2014-03-17 DIAGNOSIS — A630 Anogenital (venereal) warts: Secondary | ICD-10-CM

## 2014-03-17 DIAGNOSIS — K6282 Dysplasia of anus: Secondary | ICD-10-CM | POA: Diagnosis not present

## 2014-03-17 DIAGNOSIS — Z01812 Encounter for preprocedural laboratory examination: Secondary | ICD-10-CM | POA: Insufficient documentation

## 2014-03-17 DIAGNOSIS — Z21 Asymptomatic human immunodeficiency virus [HIV] infection status: Secondary | ICD-10-CM | POA: Insufficient documentation

## 2014-03-17 DIAGNOSIS — Z79899 Other long term (current) drug therapy: Secondary | ICD-10-CM | POA: Insufficient documentation

## 2014-03-17 HISTORY — PX: EXAMINATION UNDER ANESTHESIA: SHX1540

## 2014-03-17 SURGERY — EXAM UNDER ANESTHESIA
Anesthesia: General | Site: Rectum

## 2014-03-17 SURGERY — HEMORRHOIDECTOMY
Anesthesia: General

## 2014-03-17 MED ORDER — LIDOCAINE HCL (CARDIAC) 20 MG/ML IV SOLN
INTRAVENOUS | Status: DC | PRN
  Administered 2014-03-17: 80 mg via INTRAVENOUS

## 2014-03-17 MED ORDER — PROPOFOL 10 MG/ML IV BOLUS
INTRAVENOUS | Status: DC | PRN
  Administered 2014-03-17: 200 mg via INTRAVENOUS

## 2014-03-17 MED ORDER — ONDANSETRON HCL 4 MG/2ML IJ SOLN
INTRAMUSCULAR | Status: AC
  Filled 2014-03-17: qty 2

## 2014-03-17 MED ORDER — OXYCODONE-ACETAMINOPHEN 10-325 MG PO TABS
1.0000 | ORAL_TABLET | ORAL | Status: DC | PRN
Start: 2014-03-17 — End: 2014-05-15

## 2014-03-17 MED ORDER — ROCURONIUM BROMIDE 50 MG/5ML IV SOLN
INTRAVENOUS | Status: AC
Start: 2014-03-17 — End: 2014-03-17
  Filled 2014-03-17: qty 1

## 2014-03-17 MED ORDER — BUPIVACAINE-EPINEPHRINE (PF) 0.5% -1:200000 IJ SOLN
INTRAMUSCULAR | Status: AC
  Filled 2014-03-17: qty 30

## 2014-03-17 MED ORDER — NEOSTIGMINE METHYLSULFATE 10 MG/10ML IV SOLN
INTRAVENOUS | Status: DC | PRN
  Administered 2014-03-17: 4 mg via INTRAVENOUS
  Administered 2014-03-17: 1 mg via INTRAVENOUS

## 2014-03-17 MED ORDER — DOCUSATE SODIUM 100 MG PO CAPS: 200.0000 mg | ORAL_CAPSULE | Freq: Two times a day (BID) | ORAL | Status: DC

## 2014-03-17 MED ORDER — HYDROMORPHONE HCL PF 1 MG/ML IJ SOLN: 0.2500 mg | INTRAMUSCULAR | Status: DC | PRN

## 2014-03-17 MED ORDER — ROCURONIUM BROMIDE 100 MG/10ML IV SOLN
INTRAVENOUS | Status: DC | PRN
  Administered 2014-03-17: 30 mg via INTRAVENOUS

## 2014-03-17 MED ORDER — LACTATED RINGERS IV SOLN
INTRAVENOUS | Status: DC | PRN
  Administered 2014-03-17 (×2): via INTRAVENOUS

## 2014-03-17 MED ORDER — GLYCOPYRROLATE 0.2 MG/ML IJ SOLN
INTRAMUSCULAR | Status: DC | PRN
  Administered 2014-03-17: .6 mg via INTRAVENOUS

## 2014-03-17 MED ORDER — FENTANYL CITRATE 0.05 MG/ML IJ SOLN
INTRAMUSCULAR | Status: AC
  Filled 2014-03-17: qty 5

## 2014-03-17 MED ORDER — LIDOCAINE HCL (CARDIAC) 20 MG/ML IV SOLN
INTRAVENOUS | Status: AC
  Filled 2014-03-17: qty 5

## 2014-03-17 MED ORDER — BUPIVACAINE LIPOSOME 1.3 % IJ SUSP
20.0000 mL | Freq: Once | INTRAMUSCULAR | Status: AC
  Administered 2014-03-17: 20 mL
  Filled 2014-03-17: qty 20

## 2014-03-17 MED ORDER — FENTANYL CITRATE 0.05 MG/ML IJ SOLN
INTRAMUSCULAR | Status: DC | PRN
  Administered 2014-03-17 (×2): 50 ug via INTRAVENOUS

## 2014-03-17 MED ORDER — 0.9 % SODIUM CHLORIDE (POUR BTL) OPTIME
TOPICAL | Status: DC | PRN
  Administered 2014-03-17: 1000 mL

## 2014-03-17 MED ORDER — ONDANSETRON HCL 4 MG/2ML IJ SOLN
INTRAMUSCULAR | Status: DC | PRN
  Administered 2014-03-17: 4 mg via INTRAVENOUS

## 2014-03-17 MED ORDER — MIDAZOLAM HCL 5 MG/5ML IJ SOLN
INTRAMUSCULAR | Status: DC | PRN
  Administered 2014-03-17: 2 mg via INTRAVENOUS

## 2014-03-17 MED ORDER — MIDAZOLAM HCL 2 MG/2ML IJ SOLN
INTRAMUSCULAR | Status: AC
  Filled 2014-03-17: qty 2

## 2014-03-17 MED ORDER — PROPOFOL 10 MG/ML IV BOLUS
INTRAVENOUS | Status: AC
  Filled 2014-03-17: qty 20

## 2014-03-17 SURGICAL SUPPLY — 41 items
CANISTER SUCTION 2500CC (MISCELLANEOUS) ×2 IMPLANT
COVER MAYO STAND STRL (DRAPES) ×2 IMPLANT
COVER SURGICAL LIGHT HANDLE (MISCELLANEOUS) ×2 IMPLANT
DECANTER SPIKE VIAL GLASS SM (MISCELLANEOUS) IMPLANT
DRAPE UTILITY 15X26 W/TAPE STR (DRAPE) ×4 IMPLANT
DRSG PAD ABDOMINAL 8X10 ST (GAUZE/BANDAGES/DRESSINGS) IMPLANT
ELECT CAUTERY BLADE 6.4 (BLADE) ×2 IMPLANT
ELECT REM PT RETURN 9FT ADLT (ELECTROSURGICAL) ×2
ELECTRODE REM PT RTRN 9FT ADLT (ELECTROSURGICAL) ×1 IMPLANT
GAUZE SPONGE 4X4 16PLY XRAY LF (GAUZE/BANDAGES/DRESSINGS) ×2 IMPLANT
GLOVE BIO SURGEON STRL SZ7.5 (GLOVE) ×2 IMPLANT
GLOVE BIOGEL PI IND STRL 8 (GLOVE) ×1 IMPLANT
GLOVE BIOGEL PI INDICATOR 8 (GLOVE) ×1
GOWN STRL REUS W/ TWL LRG LVL3 (GOWN DISPOSABLE) ×1 IMPLANT
GOWN STRL REUS W/ TWL XL LVL3 (GOWN DISPOSABLE) ×1 IMPLANT
GOWN STRL REUS W/TWL LRG LVL3 (GOWN DISPOSABLE) ×1
GOWN STRL REUS W/TWL XL LVL3 (GOWN DISPOSABLE) ×1
KIT BASIN OR (CUSTOM PROCEDURE TRAY) ×2 IMPLANT
KIT ROOM TURNOVER OR (KITS) ×2 IMPLANT
LOOP VESSEL MAXI BLUE (MISCELLANEOUS) IMPLANT
NEEDLE HYPO 25GX1X1/2 BEV (NEEDLE) ×2 IMPLANT
NS IRRIG 1000ML POUR BTL (IV SOLUTION) ×2 IMPLANT
PACK LITHOTOMY IV (CUSTOM PROCEDURE TRAY) ×2 IMPLANT
PAD ARMBOARD 7.5X6 YLW CONV (MISCELLANEOUS) ×4 IMPLANT
PENCIL BUTTON HOLSTER BLD 10FT (ELECTRODE) ×2 IMPLANT
SHEARS HARMONIC 9CM CVD (BLADE) ×2 IMPLANT
SPECIMEN JAR SMALL (MISCELLANEOUS) ×2 IMPLANT
SPONGE GAUZE 4X4 12PLY (GAUZE/BANDAGES/DRESSINGS) ×2 IMPLANT
SPONGE GAUZE 4X4 12PLY STER LF (GAUZE/BANDAGES/DRESSINGS) ×2 IMPLANT
SPONGE HEMORRHOID 8X3CM (HEMOSTASIS) IMPLANT
SURGILUBE 2OZ TUBE FLIPTOP (MISCELLANEOUS) ×2 IMPLANT
SUT CHROMIC 2 0 SH (SUTURE) IMPLANT
SUT CHROMIC 3 0 SH 27 (SUTURE) ×2 IMPLANT
SYR BULB 3OZ (MISCELLANEOUS) IMPLANT
SYR CONTROL 10ML LL (SYRINGE) ×2 IMPLANT
TOWEL OR 17X24 6PK STRL BLUE (TOWEL DISPOSABLE) ×2 IMPLANT
TOWEL OR 17X26 10 PK STRL BLUE (TOWEL DISPOSABLE) ×2 IMPLANT
TUBE CONNECTING 12X1/4 (SUCTIONS) ×2 IMPLANT
VACUUM HOSE 7/8X10 W/ WAND (MISCELLANEOUS) ×2 IMPLANT
WATER STERILE IRR 1000ML POUR (IV SOLUTION) IMPLANT
YANKAUER SUCT BULB TIP NO VENT (SUCTIONS) ×2 IMPLANT

## 2014-03-17 NOTE — Discharge Instructions (Signed)
GENERAL SURGERY: POST OP INSTRUCTIONS ° °1. DIET: Follow a light bland diet the first 24 hours after arrival home, such as soup, liquids, crackers, etc.  Be sure to include lots of fluids daily.  Avoid fast food or heavy meals as your are more likely to get nauseated.   °2. Take your usually prescribed home medications unless otherwise directed. °3. PAIN CONTROL: °a. Pain is best controlled by a usual combination of three different methods TOGETHER: °i. Ice/Heat °ii. Over the counter pain medication °iii. Prescription pain medication °b. Most patients will experience some swelling and bruising around the incisions.  Ice packs or heating pads (30-60 minutes up to 6 times a day) will help. Use ice for the first few days to help decrease swelling and bruising, then switch to heat to help relax tight/sore spots and speed recovery.  Some people prefer to use ice alone, heat alone, alternating between ice & heat.  Experiment to what works for you.  Swelling and bruising can take several weeks to resolve.   °c. It is helpful to take an over-the-counter pain medication regularly for the first few weeks.  Choose one of the following that works best for you: °i. Naproxen (Aleve, etc)  Two 220mg tabs twice a day °ii. Ibuprofen (Advil, etc) Three 200mg tabs four times a day (every meal & bedtime) °iii. Acetaminophen (Tylenol, etc) 500-650mg four times a day (every meal & bedtime) °d. A  prescription for pain medication (such as oxycodone, hydrocodone, etc) should be given to you upon discharge.  Take your pain medication as prescribed.  °i. If you are having problems/concerns with the prescription medicine (does not control pain, nausea, vomiting, rash, itching, etc), please call us [phone] to see if we need to switch you to a different pain medicine that will work better for you and/or control your side effect better. °ii. If you need a refill on your pain medication, please contact your pharmacy.  They will contact our  office to request authorization. Prescriptions will not be filled after 5 pm or on week-ends. °4. Avoid getting constipated.  Between the surgery and the pain medications, it is common to experience some constipation.  Increasing fluid intake and taking a fiber supplement (such as Metamucil, Citrucel, FiberCon, MiraLax, etc) 1-2 times a day regularly will usually help prevent this problem from occurring.  A mild laxative (prune juice, Milk of Magnesia, MiraLax, etc) should be taken according to package directions if there are no bowel movements after 48 hours.   °5. Wash / shower every day.  You may shower over the dressings as they are waterproof.  Continue to shower over incision(s) after the dressing is off. °6. Remove your waterproof bandages 5 days after surgery.  You may leave the incision open to air.  You may have skin tapes (Steri Strips) covering the incision(s).  Leave them on until one week, then remove.  You may replace a dressing/Band-Aid to cover the incision for comfort if you wish.  ° ° ° ° °7. ACTIVITIES as tolerated:   °a. You may resume regular (light) daily activities beginning the next day--such as daily self-care, walking, climbing stairs--gradually increasing activities as tolerated.  If you can walk 30 minutes without difficulty, it is safe to try more intense activity such as jogging, treadmill, bicycling, low-impact aerobics, swimming, etc. °b. Save the most intensive and strenuous activity for last such as sit-ups, heavy lifting, contact sports, etc  Refrain from any heavy lifting or straining until you   are off narcotics for pain control.   c. DO NOT PUSH THROUGH PAIN.  Let pain be your guide: If it hurts to do something, don't do it.  Pain is your body warning you to avoid that activity for another week until the pain goes down. d. You may drive when you are no longer taking prescription pain medication, you can comfortably wear a seatbelt, and you can safely maneuver your car and  apply brakes. e. Dennis Bast may have sexual intercourse when it is comfortable.  8. FOLLOW UP in our office a. Please call [facility] at [phone] to set up an appointment to see your surgeon in the office for a follow-up appointment approximately 2-3 weeks after your surgery. b. Make sure that you call for this appointment the day you arrive home to insure a convenient appointment time. 9. IF YOU HAVE DISABILITY OR FAMILY LEAVE FORMS, BRING THEM TO THE OFFICE FOR PROCESSING.  DO NOT GIVE THEM TO YOUR DOCTOR.   WHEN TO CALL us [phone]: 1. Poor pain control 2. Reactions / problems with new medications (rash/itching, nausea, etc)  3. Fever over 101.5 F (38.5 C) 4. Worsening swelling or bruising 5. Continued bleeding from incision. 6. Increased pain, redness, or drainage from the incision 7. Difficulty breathing / swallowing   The clinic staff is available to answer your questions during regular business hours (8:30am-5pm).  Please dont hesitate to call and ask to speak to one of our nurses for clinical concerns.   If you have a medical emergency, go to the nearest emergency room or call 911.  A surgeon from [facility] is always on call at the [facility], [place], [place], [place], [place]  [address] ? MAIN: [phone] ? TOLL FREE: [phone] ?  FAX [phone] [email or web address]  What to eat:  For your first meals, you should eat lightly; only small meals initially.  If you do not have nausea, you may eat larger meals.  Avoid spicy, greasy and heavy food.    General Anesthesia, Adult, Care After  Refer to this sheet in the next few weeks. These instructions provide you with information on caring for yourself after your procedure. Your health care provider may also give you more specific instructions. Your treatment has been planned according to current medical practices, but problems sometimes occur. Call  your health care provider if you have any problems or questions after your procedure.  WHAT TO EXPECT AFTER THE PROCEDURE  After the procedure, it is typical to experience:  Sleepiness.  Nausea and vomiting. HOME CARE INSTRUCTIONS  For the first 24 hours after general anesthesia:  Have a responsible person with you.  Do not drive a car. If you are alone, do not take public transportation.  Do not drink alcohol.  Do not take medicine that has not been prescribed by your health care provider.  Do not sign important papers or make important decisions.  You may resume a normal diet and activities as directed by your health care provider.  Change bandages (dressings) as directed.  If you have questions or problems that seem related to general anesthesia, call the hospital and ask for the anesthetist or anesthesiologist on call. SEEK MEDICAL CARE IF:  You have nausea and vomiting that continue the day after anesthesia.  You develop a rash. SEEK IMMEDIATE MEDICAL CARE IF:  You have difficulty breathing.  You have chest pain.  You have any allergic problems.  Document Released: 12/22/2000 Document Revised: 05/18/2013 Document Reviewed: 03/31/2013  [facility] Patient Information 2014 [place], [place].

## 2014-03-17 NOTE — Anesthesia Postprocedure Evaluation (Signed)
  Anesthesia Post-op Note  Patient: Travis Wheeler  Procedure(s) Performed: Procedure(s): EXAM UNDER ANESTHESIA AND EXCISION OF ANAL MASS (N/A)  Patient Location: PACU  Anesthesia Type:General  Level of Consciousness: awake  Airway and Oxygen Therapy: Patient Spontanous Breathing  Post-op Pain: mild  Post-op Assessment: Post-op Vital signs reviewed  Post-op Vital Signs: Reviewed  Last Vitals:  Filed Vitals:   03/17/14 0845  BP: 128/72  Pulse: 97  Temp:   Resp: 16    Complications: No apparent anesthesia complications

## 2014-03-17 NOTE — Transfer of Care (Signed)
Immediate Anesthesia Transfer of Care Note  Patient: Travis Wheeler  Procedure(s) Performed: Procedure(s): EXAM UNDER ANESTHESIA AND EXCISION OF ANAL MASS (N/A)  Patient Location: PACU  Anesthesia Type:General  Level of Consciousness: awake, alert  and oriented  Airway & Oxygen Therapy: Patient Spontanous Breathing  Post-op Assessment: Report given to PACU RN  Post vital signs: Reviewed and stable  Complications: No apparent anesthesia complications

## 2014-03-17 NOTE — Op Note (Signed)
03/17/2014  8:03 AM  PATIENT:  Debbe Odea  48 y.o. male  PRE-OPERATIVE DIAGNOSIS:  perianal mass  POST-OPERATIVE DIAGNOSIS:  perianal mass  PROCEDURE:  Procedure(s): EXAM UNDER ANESTHESIA AND EXCISION OF ANAL MASS (N/A)  SURGEON:  Surgeon(s) and Role:    * Ralene Ok, MD - Primary  ASSISTANTS: none   ANESTHESIA:   local and general  EBL:   <5cc  BLOOD ADMINISTERED:none  DRAINS: none   LOCAL MEDICATIONS USED:  OTHER Exapril  SPECIMEN:  Source of Specimen:  Perianal Mass  DISPOSITION OF SPECIMEN:  PATHOLOGY  COUNTS:  YES  TOURNIQUET:  * No tourniquets in log *  DICTATION: .Dragon Dictation The patient was taken back . And placed in the lithotomy position. Patient was then prepped and draped in the usual sterile fashion.  A Timeout was called and all facts were verified. The perianal mass was then grasped with Allis clamp. A Bovie cautery was used to circumferentially excise the mass. The mass or possibly 5:00 position with the patient in lithotomy position. The harmonic scalpel was used to removing the subcutaneous tissue. Fulguration of the electrocautery was used to maintain and obtain hemostasis of the wound. The wound was reapproximated with 2-0 chromic stitch in an interrupted fashion. Exapril anesthetic was then injected around the wound. The wounds and dressed with 4 x 4's and mesh panties. The patient tolerated the procedure well was taken to the recovery stable condition.  PLAN OF CARE: Discharge to home after PACU  PATIENT DISPOSITION:  PACU - hemodynamically stable.   Delay start of Pharmacological VTE agent (>24hrs) due to surgical blood loss or risk of bleeding: not applicable

## 2014-03-17 NOTE — H&P (View-Only) (Signed)
Patient ID: Travis Wheeler, male   DOB: 05-09-1966, 48 y.o.   MRN: [id number]  Chief Complaint  Patient presents with  . rectal abscess    HPI Travis Wheeler is a 48 y.o. male.  Pt is a 48 y/o M who referred by Dr. Delfina Redwood for an evaluation of a perirectal asbcess vs. Hemorrhoid.  Pt states its been there for several months and has pain with BMs.  He has some blood come from the area.  He takes Hem supp and feels that helps at times.  HPI  Past Medical History  Diagnosis Date  . Kidney stones   . HIV infection     History reviewed. No pertinent past surgical history.  History reviewed. No pertinent family history.  Social History History  Substance Use Topics  . Smoking status: Never Smoker   . Smokeless tobacco: Never Used  . Alcohol Use: Yes     Comment: Socially    No Known Allergies  Current Outpatient Prescriptions  Medication Sig Dispense Refill  . diphenhydrAMINE (SOMINEX) 25 MG tablet Take 25 mg by mouth at bedtime as needed for sleep.      Marland Kitchen elvitegravir-cobicistat-emtricitabine-tenofovir (STRIBILD) 150-150-200-300 MG TABS tablet Take 1 tablet by mouth daily.  30 tablet  11   No current facility-administered medications for this visit.    Review of Systems Review of Systems  Constitutional: Negative.   HENT: Negative.   Eyes: Negative.   Respiratory: Negative.   Cardiovascular: Negative.   Gastrointestinal: Negative.   Endocrine: Negative.   Neurological: Negative.     Blood pressure 132/76, pulse 77, temperature 98 F (36.7 C), resp. rate 16, height 5\' 10"  (1.778 m), weight 176 lb (79.833 kg).  Physical Exam Physical Exam  Constitutional: He is oriented to person, place, and time. He appears well-developed and well-nourished.  HENT:  Head: Normocephalic and atraumatic.  Eyes: Conjunctivae and EOM are normal. Pupils are equal, round, and reactive to light.  Neck: Normal range of motion. Neck supple.  Cardiovascular: Normal rate, regular rhythm and  normal heart sounds.   Pulmonary/Chest: Effort normal and breath sounds normal.  Abdominal: Soft. Bowel sounds are normal.  Genitourinary:     Small anal tag, ttp  Musculoskeletal: Normal range of motion.  Neurological: He is alert and oriented to person, place, and time.  Skin: Skin is warm and dry.    Data Reviewed none  Assessment    48 y/o M with external hemorrhoid at 6:00 vs. Anal condyloma vs. Anal mass     Plan    1. To OR for EUA, exicision of perianal mass. 2. I d/w the patient the risks and benefits of the proc to include but not limited to: infection, bleeding, damage to surround structures, posisble recurrence.  The patient voiced understanding and wishes to proceed.        Ralene Ok 02/17/2014, 2:23 PM

## 2014-03-17 NOTE — Anesthesia Preprocedure Evaluation (Addendum)
Anesthesia Evaluation  Patient identified by MRN, date of birth, ID band Patient awake    Reviewed: Allergy & Precautions, H&P , NPO status , Patient's Chart, lab work & pertinent test results  Airway Mallampati: II TM Distance: >3 FB     Dental   Pulmonary neg shortness of breath,  History noted. CE breath sounds clear to auscultation        Cardiovascular negative cardio ROS  Rhythm:Regular Rate:Normal  Cardiac history noted. CE   Neuro/Psych    GI/Hepatic negative GI ROS, Neg liver ROS,   Endo/Other    Renal/GU Renal diseasenegative Renal ROS     Musculoskeletal   Abdominal   Peds  Hematology   Anesthesia Other Findings   Reproductive/Obstetrics                        Anesthesia Physical Anesthesia Plan  ASA: II  Anesthesia Plan: General   Post-op Pain Management:    Induction: Intravenous  Airway Management Planned: Oral ETT  Additional Equipment:   Intra-op Plan:   Post-operative Plan: Extubation in OR  Informed Consent: I have reviewed the patients History and Physical, chart, labs and discussed the procedure including the risks, benefits and alternatives for the proposed anesthesia with the patient or authorized representative who has indicated his/her understanding and acceptance.   Dental advisory given  Plan Discussed with: CRNA, Anesthesiologist and Surgeon  Anesthesia Plan Comments:        Anesthesia Quick Evaluation

## 2014-03-17 NOTE — Interval H&P Note (Signed)
History and Physical Interval Note:  03/17/2014 6:45 AM  Travis Wheeler  has presented today for surgery, with the diagnosis of anal mass  The various methods of treatment have been discussed with the patient and family. After consideration of risks, benefits and other options for treatment, the patient has consented to  Procedure(s): EXAM UNDER ANESTHESIA AND EXCISION OF ANAL MASS (N/A) as a surgical intervention .  The patient's history has been reviewed, patient examined, no change in status, stable for surgery.  I have reviewed the patient's chart and labs.  Questions were answered to the patient's satisfaction.     Rosario Jacks., Anne Hahn

## 2014-03-21 ENCOUNTER — Encounter (HOSPITAL_COMMUNITY): Payer: Self-pay | Admitting: General Surgery

## 2014-03-22 ENCOUNTER — Telehealth: Payer: Self-pay | Admitting: *Deleted

## 2014-03-22 NOTE — Telephone Encounter (Signed)
Called the [employer].  Erland has been approved for another year with [employer].  His effective date is through 03-23-15.

## 2014-04-06 ENCOUNTER — Ambulatory Visit (INDEPENDENT_AMBULATORY_CARE_PROVIDER_SITE_OTHER): Payer: No Typology Code available for payment source | Admitting: General Surgery

## 2014-04-06 ENCOUNTER — Encounter (INDEPENDENT_AMBULATORY_CARE_PROVIDER_SITE_OTHER): Payer: Self-pay | Admitting: General Surgery

## 2014-04-06 VITALS — BP 134/82 | HR 80 | Temp 97.5°F | Resp 16 | Ht 71.0 in | Wt 172.8 lb

## 2014-04-06 DIAGNOSIS — K6282 Dysplasia of anus: Secondary | ICD-10-CM

## 2014-04-06 NOTE — Progress Notes (Signed)
Patient ID: Travis Wheeler, male   DOB: [birth year], 48 y.o.   MRN: [id number] Post op course The patient is a 48 year old male status post perianal mass excision. Patient has been doing well since his operation. He started with some mucus discharge from the wound.he has had no fevers and chills at home.  On Exam: Wound is clean dry and intact  Pathology:   LOW ANAL SQUAMOUS INTRAEPITHELIAL LESION (AIN-I, MILD DYSPLASIA, CONDYLOMA). This was discussed with the patient.  Assessment and Plan 48 y/o M s/p perianal mass exicion 1. I discussed with the patient the significance of the pathology. Also discussed the fact that he may need surveillance or high-resolution anoscopy to fully evaluate the rest of the anal canal. 2. We will have this patient referred to Dr. Marcello Moores a colorectal specialist.   Ralene Ok, MD [facility] General & Minimally Invasive Surgery Trauma & Emergency Surgery

## 2014-04-25 ENCOUNTER — Ambulatory Visit (INDEPENDENT_AMBULATORY_CARE_PROVIDER_SITE_OTHER): Payer: Managed Care, Other (non HMO) | Admitting: General Surgery

## 2014-05-15 ENCOUNTER — Other Ambulatory Visit: Payer: No Typology Code available for payment source

## 2014-05-15 ENCOUNTER — Ambulatory Visit (INDEPENDENT_AMBULATORY_CARE_PROVIDER_SITE_OTHER): Payer: Managed Care, Other (non HMO) | Admitting: General Surgery

## 2014-05-15 VITALS — BP 118/66 | HR 79 | Temp 98.5°F | Ht 71.0 in | Wt 166.4 lb

## 2014-05-15 DIAGNOSIS — Z113 Encounter for screening for infections with a predominantly sexual mode of transmission: Secondary | ICD-10-CM

## 2014-05-15 DIAGNOSIS — Z79899 Other long term (current) drug therapy: Secondary | ICD-10-CM

## 2014-05-15 DIAGNOSIS — B20 Human immunodeficiency virus [HIV] disease: Secondary | ICD-10-CM

## 2014-05-15 DIAGNOSIS — K6282 Dysplasia of anus: Secondary | ICD-10-CM

## 2014-05-15 LAB — LIPID PANEL
CHOL/HDL RATIO: 3.7 ratio
CHOLESTEROL: 148 mg/dL (ref 0–200)
HDL: 40 mg/dL (ref >39)
LDL Cholesterol: 84 mg/dL (ref 0–99)
Triglycerides: 121 mg/dL (ref <150)
VLDL: 24 mg/dL (ref 0–40)

## 2014-05-15 LAB — COMPLETE METABOLIC PANEL WITH GFR
ALT: 26 U/L (ref 0–53)
AST: 20 U/L (ref 0–37)
Albumin: 4.5 g/dL (ref 3.5–5.2)
Alkaline Phosphatase: 129 U/L — ABNORMAL HIGH (ref 39–117)
BUN: 8 mg/dL (ref 6–23)
CALCIUM: 9.7 mg/dL (ref 8.4–10.5)
CHLORIDE: 104 meq/L (ref 96–112)
CO2: 30 mEq/L (ref 19–32)
CREATININE: 1.08 mg/dL (ref 0.50–1.35)
GFR, Est African American: >89 mL/min
GFR, Est Non African American: 81 mL/min
Glucose, Bld: 97 mg/dL (ref 70–99)
Potassium: 3.9 mEq/L (ref 3.5–5.3)
Sodium: 141 mEq/L (ref 135–145)
Total Bilirubin: 0.4 mg/dL (ref 0.2–1.2)
Total Protein: 7.5 g/dL (ref 6.0–8.3)

## 2014-05-15 LAB — CBC WITH DIFFERENTIAL/PLATELET
BASOS ABS: 0 10*3/uL (ref 0.0–0.1)
BASOS PCT: 0 % (ref 0–1)
EOS ABS: 0 10*3/uL (ref 0.0–0.7)
Eosinophils Relative: 1 % (ref 0–5)
HCT: 41.3 % (ref 39.0–52.0)
Hemoglobin: 14.6 g/dL (ref 13.0–17.0)
LYMPHS ABS: 1.1 10*3/uL (ref 0.7–4.0)
Lymphocytes Relative: 38 % (ref 12–46)
MCH: 31 pg (ref 26.0–34.0)
MCHC: 35.4 g/dL (ref 30.0–36.0)
MCV: 87.7 fL (ref 78.0–100.0)
Monocytes Absolute: 0.3 10*3/uL (ref 0.1–1.0)
Monocytes Relative: 9 % (ref 3–12)
NEUTROS PCT: 52 % (ref 43–77)
Neutro Abs: 1.5 10*3/uL — ABNORMAL LOW (ref 1.7–7.7)
PLATELETS: 259 10*3/uL (ref 150–400)
RBC: 4.71 MIL/uL (ref 4.22–5.81)
RDW: 13.8 % (ref 11.5–15.5)
WBC: 2.8 10*3/uL — ABNORMAL LOW (ref 4.0–10.5)

## 2014-05-15 LAB — RPR

## 2014-05-15 NOTE — Patient Instructions (Signed)
Return to the office in 3 months.  Keep area clean and dry

## 2014-05-15 NOTE — Progress Notes (Signed)
Chief Complaint  Patient presents with  . Routine Post Op    HISTORY: Travis Wheeler is a 48 y.o. male who presents to the office with a left posterior perirectal mass, which was excised 2 months ago.  This showed AIN 1.  His symptoms include occasional bleeding and drainage.  He has HIV and has been on treatment for about 1 year. his bowel habits are regular and his bowel movements are soft.    Past Medical History  Diagnosis Date  . Kidney stones   . HIV infection       Past Surgical History  Procedure Laterality Date  . Examination under anesthesia N/A 03/17/2014    Procedure: EXAM UNDER ANESTHESIA AND EXCISION OF ANAL MASS;  Surgeon: Ralene Ok, MD;  Location: [facility];  Service: General;  Laterality: N/A;        Current Outpatient Prescriptions  Medication Sig Dispense Refill  . cetirizine (ZYRTEC) 10 MG tablet Take 10 mg by mouth daily.      . diphenhydrAMINE (BENADRYL) 25 mg capsule Take 25 mg by mouth every 6 (six) hours as needed for allergies or sleep.      Marland Kitchen elvitegravir-cobicistat-emtricitabine-tenofovir (STRIBILD) 150-150-200-300 MG TABS tablet Take 1 tablet by mouth daily.  30 tablet  11   No current facility-administered medications for this visit.      No Known Allergies    No family history on file.  History   Social History  . Marital Status: Single    Spouse Name: N/A    Number of Children: N/A  . Years of Education: N/A   Social History Main Topics  . Smoking status: Never Smoker   . Smokeless tobacco: Never Used  . Alcohol Use: Yes     Comment: Socially  . Drug Use: No  . Sexual Activity: Not Currently    Partners: Male     Comment: pt accpeted  condoms   Other Topics Concern  . Not on file   Social History Narrative  . No narrative on file      REVIEW OF SYSTEMS - PERTINENT POSITIVES ONLY: Review of Systems - General ROS: negative for - chills, fever or weight loss Hematological and Lymphatic ROS: negative for - bleeding problems,  blood clots or bruising Respiratory ROS: no cough, shortness of breath, or wheezing Cardiovascular ROS: no chest pain or dyspnea on exertion Gastrointestinal ROS: no abdominal pain, change in bowel habits, or black or bloody stools Genito-Urinary ROS: no dysuria, trouble voiding, or hematuria  EXAM: Filed Vitals:   05/15/14 0957  BP: 118/66  Pulse: 79  Temp: 98.5 F (36.9 C)    General appearance: alert and cooperative Resp: clear to auscultation bilaterally Cardio: regular rate and rhythm GI: normal findings: soft, non-tender  Lab Results  Component Value Date   CD4TCELL 17* 12/27/2013   CD4TABS 270* 12/27/2013    Procedure: Anoscopy Surgeon: Marcello Moores Diagnosis: AIN 1  Assistant: Alyse Low After the risks and benefits were explained, verbal consent was obtained for above procedure  Anesthesia: none Findings: surgical scar L posterior perianal region, no obvious dysplasia noted intra-anally    ASSESSMENT AND PLAN: Travis Wheeler is a 48 y.o. male Who is 2 months status post resection of a perirectal mass. Pathology showed AIN 1.  We discussed management of the situation. I offered him intraoffice evaluation on a regular basis versus High-resolution anoscopy in the operating room with possible laser ablation of any dysplastic appearing lesions. He has elected to undergo office surveillance for now.  If we see any growth of any new lesions we will proceed to the operating room for further treatment and evaluation.    Rosario Adie, MD Colon and Rectal Surgery / [facility]      Visit Diagnoses: No diagnosis found.  Primary Care Physician: Kandice Hams, MD

## 2014-05-16 LAB — T-HELPER CELL (CD4) - (RCID CLINIC ONLY)
CD4 T CELL ABS: 270 /uL — AB (ref 400–2700)
CD4 T CELL HELPER: 21 % — AB (ref 33–55)

## 2014-05-17 LAB — HIV-1 RNA QUANT-NO REFLEX-BLD
HIV 1 RNA Quant: 45 copies/mL — ABNORMAL HIGH (ref <20)
HIV-1 RNA Quant, Log: 1.65 {Log} — ABNORMAL HIGH (ref <1.30)

## 2014-06-08 ENCOUNTER — Ambulatory Visit: Payer: No Typology Code available for payment source | Admitting: Internal Medicine

## 2014-06-22 ENCOUNTER — Ambulatory Visit: Payer: No Typology Code available for payment source | Admitting: Internal Medicine

## 2014-07-18 ENCOUNTER — Encounter: Payer: Self-pay | Admitting: Internal Medicine

## 2014-07-18 ENCOUNTER — Ambulatory Visit (INDEPENDENT_AMBULATORY_CARE_PROVIDER_SITE_OTHER): Payer: No Typology Code available for payment source | Admitting: Internal Medicine

## 2014-07-18 VITALS — BP 135/83 | HR 83 | Temp 98.1°F | Wt 167.0 lb

## 2014-07-18 DIAGNOSIS — Q439 Congenital malformation of intestine, unspecified: Secondary | ICD-10-CM | POA: Diagnosis not present

## 2014-07-18 DIAGNOSIS — Z21 Asymptomatic human immunodeficiency virus [HIV] infection status: Secondary | ICD-10-CM | POA: Diagnosis not present

## 2014-07-18 DIAGNOSIS — B20 Human immunodeficiency virus [HIV] disease: Secondary | ICD-10-CM

## 2014-07-18 DIAGNOSIS — K6282 Dysplasia of anus: Secondary | ICD-10-CM | POA: Insufficient documentation

## 2014-07-18 NOTE — Progress Notes (Signed)
  Subjective:    Patient ID: Travis Wheeler, male    DOB: July 12, 1966, 48 y.o.   MRN: [id number]  HPI He comes in for routine followup.  He continues on Stribild and denies any missed doses.   He is pleased with the regimen with no nausea or vomiting. No weight loss or diarrhea.    Sees Dr. Marcello Moores for AIN.  Has not followed up since August b/c he is afraid of more bills.  He is having continued bleeding.    Review of Systems  Constitutional: Negative for fever, fatigue and unexpected weight change.  HENT: Negative for sore throat and trouble swallowing.   Eyes: Negative for visual disturbance.  Respiratory: Negative for cough and shortness of breath.   Cardiovascular: Negative for leg swelling.  Gastrointestinal: Negative for nausea, abdominal pain and diarrhea.  Musculoskeletal: Negative for arthralgias and myalgias.  Skin: Negative for rash.  Neurological: Negative for dizziness, light-headedness and headaches.  Hematological: Negative for adenopathy.  Psychiatric/Behavioral: Negative for dysphoric mood.       Objective:   Physical Exam  Constitutional: He is oriented to person, place, and time. He appears well-developed and well-nourished. No distress.  HENT:  Mouth/Throat: Oropharynx is clear and moist. No oropharyngeal exudate.  Eyes: Right eye exhibits no discharge. Left eye exhibits no discharge. No scleral icterus.  Cardiovascular: Normal rate, regular rhythm and normal heart sounds.   No murmur heard. Pulmonary/Chest: Effort normal and breath sounds normal. No respiratory distress. He has no wheezes.  Lymphadenopathy:    He has no cervical adenopathy.  Neurological: He is alert and oriented to person, place, and time.  Skin: Skin is warm. No rash noted.          Assessment & Plan:

## 2014-07-18 NOTE — Assessment & Plan Note (Signed)
I discussed the need to get back to see Dr. Marcello Moores for continued follow up and he is going to call and make an appt.

## 2014-07-18 NOTE — Assessment & Plan Note (Signed)
Doing good from this standpoint.  Minimally detectabl virus. RTC 4 mnths.

## 2014-11-07 ENCOUNTER — Other Ambulatory Visit (INDEPENDENT_AMBULATORY_CARE_PROVIDER_SITE_OTHER): Payer: Managed Care, Other (non HMO)

## 2014-11-07 DIAGNOSIS — Z21 Asymptomatic human immunodeficiency virus [HIV] infection status: Secondary | ICD-10-CM

## 2014-11-07 DIAGNOSIS — B20 Human immunodeficiency virus [HIV] disease: Secondary | ICD-10-CM

## 2014-11-08 LAB — HIV-1 RNA QUANT-NO REFLEX-BLD: HIV-1 RNA Quant, Log: <1.3 {Log} (ref <1.30)

## 2014-11-08 LAB — T-HELPER CELL (CD4) - (RCID CLINIC ONLY)
CD4 % Helper T Cell: 19 % — ABNORMAL LOW (ref 33–55)
CD4 T Cell Abs: 320 /uL — ABNORMAL LOW (ref 400–2700)

## 2014-11-20 ENCOUNTER — Emergency Department: Payer: Commercial Managed Care - POS

## 2014-11-20 ENCOUNTER — Emergency Department
Admission: EM | Admit: 2014-11-20 | Discharge: 2014-11-20 | Disposition: A | Discharge status: Home / Self Care | Payer: Commercial Managed Care - POS

## 2014-11-20 DIAGNOSIS — R42 Dizziness and giddiness: Secondary | ICD-10-CM | POA: Insufficient documentation

## 2014-11-20 DIAGNOSIS — I10 Essential (primary) hypertension: Secondary | ICD-10-CM | POA: Insufficient documentation

## 2014-11-20 LAB — CBC AND DIFFERENTIAL
Basophils %: 0.8 % (ref 0.0–3.0)
Basophils Absolute: 0.1 10*3/uL (ref 0.0–0.3)
Eosinophils %: 4.5 % (ref 0.0–7.0)
Eosinophils Absolute: 0.3 10*3/uL (ref 0.0–0.8)
Hematocrit: 44.4 % (ref 39.0–52.5)
Hemoglobin: 14.9 gm/dL (ref 13.0–17.5)
Lymphocytes Absolute: 2 10*3/uL (ref 0.6–5.1)
Lymphocytes: 30.4 % (ref 15.0–46.0)
MCH: 31 pg (ref 28–35)
MCHC: 34 gm/dL (ref 32–36)
MCV: 93 fL (ref 80–100)
MPV: 7.5 fL (ref 6.0–10.0)
Monocytes Absolute: 0.7 10*3/uL (ref 0.1–1.7)
Monocytes: 11.1 % (ref 3.0–15.0)
Neutrophils %: 53.3 % (ref 42.0–78.0)
Neutrophils Absolute: 3.6 10*3/uL (ref 1.7–8.6)
PLT CT: 250 10*3/uL (ref 130–440)
RBC: 4.78 10*6/uL (ref 4.00–5.70)
RDW: 12.3 % (ref 11.0–14.0)
WBC: 6.7 10*3/uL (ref 4.0–11.0)

## 2014-11-20 LAB — BASIC METABOLIC PANEL
Anion Gap: 15.9 mMol/L (ref 7.0–18.0)
BUN / Creatinine Ratio: 28 Ratio (ref 10.0–30.0)
BUN: 26 mg/dL — ABNORMAL HIGH (ref 7–22)
CO2: 24.4 mMol/L (ref 20.0–30.0)
Calcium: 9.3 mg/dL (ref 8.5–10.5)
Chloride: 104 mMol/L (ref 98–110)
Creatinine: 0.93 mg/dL (ref 0.80–1.30)
EGFR: >60 mL/min/{1.73_m2}
Glucose: 85 mg/dL (ref 70–99)
Osmolality Calc: 283 mOsm/kg (ref 275–300)
Potassium: 4.3 mMol/L (ref 3.5–5.3)
Sodium: 140 mMol/L (ref 136–147)

## 2014-11-20 LAB — ECG 12-LEAD
P Wave Axis: 48 deg
P Wave Duration: 112 ms
P-R Interval: 156 ms
Patient Age: 48 years
Q-T Dispersion: 16 ms
Q-T Interval(Corrected): 416 ms
Q-T Interval: 380 ms
QRS Axis: -10 deg
QRS Duration: 98 ms
T Axis: 13 deg
Ventricular Rate: 72 /min

## 2014-11-20 LAB — TROPONIN I: Troponin I: 0.02 ng/mL (ref 0.00–0.02)

## 2014-11-20 MED ORDER — ONDANSETRON HCL 4 MG/2ML IJ SOLN
4.0000 mg | Freq: Once | INTRAMUSCULAR | Status: AC
Start: 2014-11-20 — End: 2014-11-20
  Administered 2014-11-20: 4 mg via INTRAVENOUS

## 2014-11-20 MED ORDER — ONDANSETRON HCL 4 MG/2ML IJ SOLN
INTRAMUSCULAR | Status: AC
Start: 2014-11-20 — End: ?
  Filled 2014-11-20: qty 2

## 2014-11-20 MED ORDER — MECLIZINE HCL 25 MG PO TABS
ORAL_TABLET | ORAL | Status: AC
Start: 2014-11-20 — End: ?
  Filled 2014-11-20: qty 1

## 2014-11-20 MED ORDER — SODIUM CHLORIDE 0.9 % IV SOLN
INTRAVENOUS | Status: DC
Start: 2014-11-20 — End: 2014-11-21

## 2014-11-20 MED ORDER — SODIUM CHLORIDE 0.9 % IV BOLUS
500.0000 mL | Freq: Once | INTRAVENOUS | Status: AC
Start: 2014-11-20 — End: 2014-11-20
  Administered 2014-11-20: 500 mL via INTRAVENOUS

## 2014-11-20 MED ORDER — MECLIZINE HCL 25 MG PO TABS
25.0000 mg | ORAL_TABLET | Freq: Four times a day (QID) | ORAL | Status: AC | PRN
Start: 2014-11-20 — End: ?

## 2014-11-20 MED ORDER — MECLIZINE HCL 25 MG PO TABS
25.0000 mg | ORAL_TABLET | Freq: Once | ORAL | Status: AC
Start: 2014-11-20 — End: 2014-11-20
  Administered 2014-11-20: 25 mg via ORAL

## 2014-11-20 NOTE — ED Notes (Signed)
Pt c/o dizziness/vertigo beginning at work around 6pm. Pt had trouble walking and felt like he kept "going to the left."

## 2014-11-20 NOTE — ED Notes (Signed)
MRI called and is sending transport for pt. Pink sheet completed. Pt and wife aware

## 2014-11-20 NOTE — ED Provider Notes (Signed)
CHIEF COMPLAINT: Acute vertigo.    HISTORY: The patient is a 49 year old, white male, patient of  Dr. Deboraha Sprang.  The patient today at work developed a sudden  onset of vertigo.  The patient does have a history of tinnitus.  He does not have a history of Meniere disease.  He developed  severe dizziness that started at 6 p.m. abruptly.  He felt  like he was spinning and turning to the left.  He did have true  vertigo.  He did not have change in vision.  He did have nausea.  He did not have any dysarthria or weakness in the arms or legs.  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  He has never been diagnosed with Meniere disease.  He  has not had trauma.  He has not had any recent cough, cold, or  URI and he has never had vertigo before.    PAST MEDICAL HISTORY: Reviewed.  Past medical history is for  hypertension, elevated lipids and gastroesophageal reflux  disease.    SOCIAL HISTORY: He is a nonsmoker.    FAMILY HISTORY: Strongly positive for stroke.    MEDICATIONS:  1.  Aspirin.  2.  Lipitor.  3.  Zantac.  4.  Cialis.    Nurse's notes reviewed.  Vital signs reviewed.    REVIEW OF SYSTEMS: Activity changed abruptly at 6 p.m.  No  change in appetite, chills, fatigue, or fever.  No head  congestion, ear pain.  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  No sinus pressure.  No visual disturbance.  No seizure.  No syncope.  No chest pain, chest tightness, abdominal pain.  No  flank pain, back pain, neck stiffness.  No facial asymmetry.  No  numbness.  No seizure.  No speech difficulty.  No agitation.    PHYSICAL EXAMINATION:  GENERAL:  Middle-aged white male gentleman.  VITAL SIGNS:  BP is 110/69, pulse 77, respiratory rate 18,  temperature 98.1, O2 sat 96%.  HEENT:  The pupils are equal, round, reactive to light.  He does  not appear to have nystagmus.  The fundi are normal.  TMs  normal.  NECK:  Supple.  LUNGS:  Clear.  CARDIAC:  Regular rate and rhythm without murmur.  ABDOMEN:   Soft and nontender.  EXTREMITIES:  Are normal.  NEURO:  Speech is clear.  No nystagmus.  Extraocular movements  full and intact.  Strength is good.  No focal neurologic  deficits.    HOSPITAL COURSE AND TREATMENT: Because of the sudden onset and  family history of stroke, I recommended an MRI to rule out acute  cerebellar stroke.  CBC is normal.  Electrolytes are normal.  MRI normal.  Troponin normal.  EKG shows a sinus rhythm with no  acute changes.    DIAGNOSTIC IMPRESSION:  1.  Acute vertigo.  2.  No signs of stroke.    PLAN: Off work today and tomorrow.  Meclizine 25 q.i.d. May  follow up with Dr. Eliezer Lofts.  He is treated and released in good  condition.        [id number]  DD: 11/20/2014 91:47:82  DT: 11/20/2014 23:57:07  JOB: [id number]

## 2014-11-20 NOTE — Discharge Instructions (Signed)
Managing Dizziness (Vertigo) with Medications     Although medications can't cure your problem, they can help control symptoms. Your doctor may prescribe medications for a few weeks and then taper them off.     How medications can help   Treat infection or inflammation. If you have a bacterial infection, your doctor can prescribe antibiotics.   Limit conflicting balance signals. These medications are often in pill form.   Ease nausea. Suppositories, pills, or shots may be used to reduce vomiting.   Reduce pressure in the canals. Diuretics can be used to treat Meniere's disease. These medications help rid the body of excess fluid.   Ease other symptoms. Other medications can help ease depression and anxiety caused by living with dizziness or fainting.   2000-2015 [employer] [address], [place] [address]. All rights reserved. This information is not intended as a substitute for professional medical care. Always follow your healthcare professional's instructions.

## 2014-11-20 NOTE — ED Notes (Signed)
Pt transported to MRI 

## 2014-11-20 NOTE — ED Provider Notes (Signed)
Physician/Midlevel provider first contact with patient: 11/20/14 1930         History     Chief Complaint   Patient presents with   . Dizziness     HPI note dictated        Past Medical History   Diagnosis Date   . Hypertension    . Hyperlipidemia    . Gastroesophageal reflux disease        History reviewed. No pertinent past surgical history.    History reviewed. No pertinent family history.    Social  History   Substance Use Topics   . Smoking status: Never Smoker    . Smokeless tobacco: Not on file   . Alcohol Use: No       .     No Known Allergies    Current/Home Medications    ASPIRIN 325 MG TABLET    Take 325 mg by mouth daily.    ATORVASTATIN (LIPITOR) 20 MG TABLET    Take 20 mg by mouth daily.    RANITIDINE (ZANTAC) 75 MG TABLET    Take 75 mg by mouth 2 (two) times daily.    TADALAFIL (CIALIS) 5 MG TABLET    Take 5 mg by mouth daily as needed for Erectile Dysfunction.    UNABLE TO FIND    Med Name: "endarcyclor" to replace lisinopril, dose unknown by pt    UNABLE TO FIND    Med Name: "myrebetriq" 25mg         Review of Systems    Physical Exam    BP: 110/69 mmHg, Heart Rate: 77, Temp: 98.1 F (36.7 C), Resp Rate: 18, SpO2: 96 %, Weight: 124.739 kg    Physical Exam   Skin: He is diaphoretic.         MDM and ED Course     ED Medication Orders     Start     Status Ordering Provider    11/20/14 2013  ondansetron [facility]) injection 4 mg   Once in ED     Route: Intravenous  Ordered Dose: 4 mg     Last MAR action:  Given [facility] Belluomini W    11/20/14 2013  sodium chloride 0.9 % bolus 500 mL   Once in ED     Route: Intravenous  Ordered Dose: 500 mL     Last MAR action:  New Bag Gedeon Brandow W    11/20/14 2013  0.9%  NaCl infusion   Continuous     Route: Intravenous     Last MAR action:  New Bag [place] Chelf W    11/20/14 2013  meclizine (ANTIVERT) tablet 25 mg   Once     Route: Oral  Ordered Dose: 25 mg     Last MAR action:  Given Handsome Anglin W              MDM       Procedures    Clinical Impression &  Disposition     Clinical Impression  Final diagnoses:   Vertigo        ED Disposition     Discharge [name] discharge to home/self care.    Condition at disposition: Stable             New Prescriptions    MECLIZINE (ANTIVERT) 25 MG TABLET    Take 1 tablet (25 mg total) by mouth every 6 (six) hours as needed for Dizziness.      Off work today and tomorrow  Cordella Register, MD  11/20/14 2248

## 2014-11-20 NOTE — ED Notes (Signed)
Pt returned from MRI °

## 2014-11-21 ENCOUNTER — Ambulatory Visit (INDEPENDENT_AMBULATORY_CARE_PROVIDER_SITE_OTHER): Payer: Managed Care, Other (non HMO) | Admitting: Internal Medicine

## 2014-11-21 ENCOUNTER — Encounter: Payer: Self-pay | Admitting: Internal Medicine

## 2014-11-21 VITALS — BP 121/80 | HR 96 | Temp 98.7°F | Ht 71.0 in | Wt 169.0 lb

## 2014-11-21 DIAGNOSIS — B20 Human immunodeficiency virus [HIV] disease: Secondary | ICD-10-CM

## 2014-11-21 DIAGNOSIS — Z21 Asymptomatic human immunodeficiency virus [HIV] infection status: Secondary | ICD-10-CM

## 2014-11-21 NOTE — Assessment & Plan Note (Signed)
He is doing well and will continue same regimen. We'll consider Genvoya in the future. Labs in 4 months and I will see him after that.

## 2014-11-21 NOTE — Progress Notes (Signed)
  Subjective:    Patient ID: Travis Wheeler, male    DOB: November 19, 1965, 49 y.o.   MRN: [id number]  HPI He comes in for routine followup.  He continues on Stribild and denies any missed doses.   He is pleased with the regimen with no nausea or vomiting. No weight loss or diarrhea.  CD4 is now up to 320 and viral load is again undetectable.   Review of Systems  Constitutional: Negative for fever, fatigue and unexpected weight change.  HENT: Negative for sore throat and trouble swallowing.   Eyes: Negative for visual disturbance.  Respiratory: Negative for cough and shortness of breath.   Cardiovascular: Negative for leg swelling.  Gastrointestinal: Negative for nausea, abdominal pain and diarrhea.  Musculoskeletal: Negative for myalgias and arthralgias.  Skin: Negative for rash.  Neurological: Negative for dizziness, light-headedness and headaches.  Hematological: Negative for adenopathy.  Psychiatric/Behavioral: Negative for dysphoric mood.       Objective:   Physical Exam  Constitutional: He is oriented to person, place, and time. He appears well-developed and well-nourished. No distress.  HENT:  Mouth/Throat: Oropharynx is clear and moist. No oropharyngeal exudate.  Eyes: Right eye exhibits no discharge. Left eye exhibits no discharge. No scleral icterus.  Cardiovascular: Normal rate, regular rhythm and normal heart sounds.   No murmur heard. Pulmonary/Chest: Effort normal and breath sounds normal. No respiratory distress. He has no wheezes.  Lymphadenopathy:    He has no cervical adenopathy.  Neurological: He is alert and oriented to person, place, and time.  Skin: Skin is warm. No rash noted.          Assessment & Plan:

## 2015-03-02 ENCOUNTER — Encounter (HOSPITAL_BASED_OUTPATIENT_CLINIC_OR_DEPARTMENT_OTHER): Payer: Self-pay

## 2015-03-02 ENCOUNTER — Emergency Department (HOSPITAL_BASED_OUTPATIENT_CLINIC_OR_DEPARTMENT_OTHER)
Admission: EM | Admit: 2015-03-02 | Discharge: 2015-03-02 | Disposition: A | Discharge status: Home / Self Care | Payer: 59 | Attending: Emergency Medicine | Admitting: Emergency Medicine

## 2015-03-02 DIAGNOSIS — I10 Essential (primary) hypertension: Secondary | ICD-10-CM | POA: Insufficient documentation

## 2015-03-02 DIAGNOSIS — S40862A Insect bite (nonvenomous) of left upper arm, initial encounter: Secondary | ICD-10-CM | POA: Insufficient documentation

## 2015-03-02 DIAGNOSIS — Z7982 Long term (current) use of aspirin: Secondary | ICD-10-CM | POA: Insufficient documentation

## 2015-03-02 DIAGNOSIS — R21 Rash and other nonspecific skin eruption: Secondary | ICD-10-CM | POA: Insufficient documentation

## 2015-03-02 DIAGNOSIS — H8109 Meniere's disease, unspecified ear: Secondary | ICD-10-CM | POA: Insufficient documentation

## 2015-03-02 DIAGNOSIS — W57XXXA Bitten or stung by nonvenomous insect and other nonvenomous arthropods, initial encounter: Secondary | ICD-10-CM

## 2015-03-02 DIAGNOSIS — Z23 Encounter for immunization: Secondary | ICD-10-CM | POA: Insufficient documentation

## 2015-03-02 HISTORY — DX: Meniere's disease, unspecified ear: H81.09

## 2015-03-02 HISTORY — DX: Essential (primary) hypertension: I10

## 2015-03-02 HISTORY — DX: Presence of spectacles and contact lenses: Z97.3

## 2015-03-02 HISTORY — DX: Pure hypercholesterolemia, unspecified: E78.00

## 2015-03-02 MED ORDER — DOXYCYCLINE HYCLATE 100 MG TABLET
100.00 mg | ORAL_TABLET | ORAL | Status: AC
Start: 2015-03-02 — End: 2015-03-02
  Administered 2015-03-02: 100 mg via ORAL
  Filled 2015-03-02: qty 1

## 2015-03-02 MED ORDER — DOXYCYCLINE HYCLATE 100 MG TABLET
100.0000 mg | ORAL_TABLET | Freq: Two times a day (BID) | ORAL | Status: DC
Start: 2015-03-02 — End: 2023-09-01

## 2015-03-02 MED ORDER — DIPHTH,PERTUSSIS(ACELL),TETANUS 2.5 LF UNIT-8 MCG-5 LF/0.5 ML IM SUSP
0.5000 mL | Freq: Once | INTRAMUSCULAR | Status: AC
Start: 2015-03-02 — End: 2015-03-02
  Administered 2015-03-02: 0.5 mL via INTRAMUSCULAR
  Filled 2015-03-02: qty 0.5

## 2015-03-02 NOTE — ED Nurses Note (Signed)
Patient discharged home with family.  AVS reviewed with patient/care giver.  A written copy of the AVS and discharge instructions was given to the patient/care giver.  Questions sufficiently answered as needed.  Patient/care giver encouraged to follow up with PCP as indicated.  In the event of an emergency, patient/care giver instructed to call 911 or go to the nearest emergency room.     Reviewed discharge instructions and follow up with pt. Pt ambulated to lobby independently with SO.

## 2015-03-02 NOTE — ED Provider Notes (Signed)
Sean Fernandez, Arnez Stoneking S, MD  [facility]  Emergency Department Visit Note  Date:  03/02/2015  Primary care provider:  Deboraha SprangJohn Veltman, MD  Means of arrival:  private car  History obtained from: patient  History limited by: none    Chief Complaint: Tick Bite    HISTORY OF PRESENT ILLNESS     Sean MohairCharles Fernandez, date of birth 01/14/1966, is a 49 y.o. male who presents to the Emergency Department complaining of a tick bite.     Patient reports that he was bite by a tick on Tuesday (Feb 27, 2015). Wife has the tick with her and states that he developed a rash and redness around the bite site. Patient rates the severity of his pain as a 2 out of 10 worse with palpation. Patient indicates that he has meniere's disease and has had more exacerbations recently. He took Meclizine prior to coming to the ED without improvement.     REVIEW OF SYSTEMS     The pertinent positive and negative symptoms are as per HPI. All other systems reviewed and are negative.     PATIENT HISTORY     Past Medical History:  Past Medical History   Diagnosis Date    Meniere disease     HTN (hypertension)     High cholesterol     Wears glasses      Past Surgical History:  History reviewed. No pertinent past surgical history.    Family History:  No history of acute family illness given at this time.     Social History:  History   Substance Use Topics    Smoking status: Never Smoker     Smokeless tobacco: Not on file    Alcohol Use: Yes      Comment: occ     History   Drug Use No     Medications:  Previous Medications    ASPIRIN 325 MG ORAL TABLET    Take 325 mg by mouth Once a day    ATORVASTATIN (LIPITOR) 20 MG ORAL TABLET    Take 20 mg by mouth Every evening    MECLIZINE (ANTIVERT) 25 MG ORAL TABLET    Take 25 mg by mouth Twice per day as needed    MIRABEGRON (MYRBETRIQ) 25 MG ORAL TABLET SUSTAINED RELEASE 24 HR    Take 25 mg by mouth Once a day    OLMESARTAN (BENICAR) 20 MG ORAL TABLET    Take 20 mg by mouth Once a day    RANITIDINE HCL (ZANTAC)  300 MG ORAL TABLET    Take 300 mg by mouth Every evening     Allergies:  No Known Allergies    PHYSICAL EXAM     Vitals:  Filed Vitals:    03/02/15 0017   BP: 153/92   Pulse: 68   Temp: 37 C (98.6 F)   Resp: 16   SpO2: 97%     Pulse ox  97% on None (Room Air) interpreted by me as: Normal    Constitutional: The patient is alert and oriented to person, place, and time. Well-developed and well-nourished.  HENT: Atraumatic, normocephalic head. Mucous membranes moist. Nares unremarkable. Oropharynx shows no erythema or exudate.   Eyes: Pupils equal and round, reactive to light. No scleral icterus. Normal conjunctiva. Extraocular movements are intact.  Neck: Supple, non-tender, no nuchal rigidity, no adenopathy.   Lungs: Clear to auscultation bilaterally. Symmetric and equal expansion. No respiratory distress or retractions.  Cardiovascular: Heart is S1-S2 regular  rate and regular rhythm without murmur click or rub.  Abdomen:  Soft, non-distended. No tenderness to palpation without evidence of rebound or guarding. No pulsatile masses. No organomegaly.   Extremities: Tick bite on back of left arm with surrounding erythema. Full range of motion, no clubbing, cyanosis, or edema. Pulses 2+, capillary refill <2 seconds.  Spine: No midline or paraspinal muscle tenderness to palpation. No step-off.   Skin: Warm and dry. No cyanosis, jaundice, rash or lesion.  Neurologic: Alert and oriented x3. Normal facial symmetry and speech, Normal upper and lower extremity strength, and grossly normal sensation.     DIFFERENTIAL DIAGNOSES      Cellulitis   Tick bite   RMSF, Lyme    ED PROGRESS NOTE / MEDICAL DECISION MAKING     Old records reviewed by me:  I reviewed the patient's pertinent past medical history and problem list. Additionally, I reviewed all of the nursing notes.      Orders Placed This Encounter    doxycycline tablet    diphtheria, pertussis-acell, tetanus (BOOSTRIX) IM injection     00:34: Initial evaluation complete  at this time. IM Boostrix and PO doxycycline ordered.  The patient was provided with prescriptions for doxycycline with instructions to follow up with his PCP, Deboraha Sprang, MD. The patient understands the discharge, follow-up, and return instructions. Strict return precautions were discussed and are understood including but not limited to new or worsening symptoms. The patient is currently stable for discharge. All questions and concerns were answered to his satisfaction.     Pre-Disposition Vitals:  Filed Vitals:    03/02/15 0017   BP: 153/92   Pulse: 68   Temp: 37 C (98.6 F)   Resp: 16   SpO2: 97%     CLINICAL IMPRESSION     Encounter Diagnosis   Name Primary?    Tick bite Yes     DISPOSITION/PLAN     Discharged        Prescriptions:      Details   doxycycline 100 mg Oral Tablet Take 1 Tab (100 mg total) by mouth Twice daily, Disp-42 Tab, R-0, Print     Follow-Up:     Deboraha Sprang, MD  [address]  [place] [address]  [phone]    Call in 2 days    Condition at Disposition: Stable      SCRIBE ATTESTATION STATEMENT  I Ronaldo Miyamoto, SCRIBE scribed for Sean Pacer, MD on 03/02/2015 at 12:34 AM.     Documentation assistance provided for Sean Pacer, MD  by Ronaldo Miyamoto, SCRIBE. Information recorded by the scribe was done at my direction and has been reviewed and validated by me Sean Pacer, MD.

## 2015-03-02 NOTE — ED Nurses Note (Addendum)
Woke Tuesday morning with a tick to back of left arm. Pt has redness to area now. States that he has Meniere's disease and it's exacerbated with tick bites.

## 2015-03-08 ENCOUNTER — Other Ambulatory Visit: Payer: Managed Care, Other (non HMO)

## 2015-03-08 DIAGNOSIS — B20 Human immunodeficiency virus [HIV] disease: Secondary | ICD-10-CM

## 2015-03-08 DIAGNOSIS — Z21 Asymptomatic human immunodeficiency virus [HIV] infection status: Secondary | ICD-10-CM

## 2015-03-09 LAB — T-HELPER CELL (CD4) - (RCID CLINIC ONLY)
CD4 T CELL HELPER: 19 % — AB (ref 33–55)
CD4 T Cell Abs: 290 /uL — ABNORMAL LOW (ref 400–2700)

## 2015-03-09 LAB — HIV-1 RNA QUANT-NO REFLEX-BLD
HIV 1 RNA Quant: <20 copies/mL (ref <20)
HIV-1 RNA Quant, Log: <1.3 {Log} (ref <1.30)

## 2015-03-22 ENCOUNTER — Encounter: Payer: Self-pay | Admitting: Internal Medicine

## 2015-03-22 ENCOUNTER — Ambulatory Visit (INDEPENDENT_AMBULATORY_CARE_PROVIDER_SITE_OTHER): Payer: Managed Care, Other (non HMO) | Admitting: Internal Medicine

## 2015-03-22 VITALS — BP 136/80 | HR 81 | Temp 98.4°F | Ht 71.0 in | Wt 162.0 lb

## 2015-03-22 DIAGNOSIS — Z113 Encounter for screening for infections with a predominantly sexual mode of transmission: Secondary | ICD-10-CM

## 2015-03-22 DIAGNOSIS — Z21 Asymptomatic human immunodeficiency virus [HIV] infection status: Secondary | ICD-10-CM | POA: Diagnosis not present

## 2015-03-22 DIAGNOSIS — B20 Human immunodeficiency virus [HIV] disease: Secondary | ICD-10-CM

## 2015-03-22 DIAGNOSIS — Z79899 Other long term (current) drug therapy: Secondary | ICD-10-CM | POA: Diagnosis not present

## 2015-03-22 DIAGNOSIS — Q439 Congenital malformation of intestine, unspecified: Secondary | ICD-10-CM | POA: Diagnosis not present

## 2015-03-22 DIAGNOSIS — K6282 Dysplasia of anus: Secondary | ICD-10-CM

## 2015-03-22 NOTE — Assessment & Plan Note (Signed)
Doing great.  Will continue with the same and consider Genvoya once on formulary.

## 2015-03-22 NOTE — Progress Notes (Signed)
  Subjective:    Patient ID: Travis Wheeler, male    DOB: 03-28-1966, 49 y.o.   MRN: [id number]  HPI He comes in for routine followup.  He continues on Stribild and denies any missed doses.   He is pleased with the regimen with no nausea or vomiting. No weight loss or diarrhea.  CD4 is 290 and viral load is again undetectable.  Feels great.     Has had grade 1 AIN and saw surgery and decided on serial exams.  Has not been able to return due to financial constraints.  No warts noted at this time.    Review of Systems  Constitutional: Negative for fever, fatigue and unexpected weight change.  HENT: Negative for sore throat and trouble swallowing.   Eyes: Negative for visual disturbance.  Respiratory: Negative for cough and shortness of breath.   Cardiovascular: Negative for leg swelling.  Gastrointestinal: Negative for nausea, abdominal pain and diarrhea.  Musculoskeletal: Negative for myalgias and arthralgias.  Skin: Negative for rash.  Neurological: Negative for dizziness, light-headedness and headaches.  Hematological: Negative for adenopathy.  Psychiatric/Behavioral: Negative for dysphoric mood.       Objective:   Physical Exam  Constitutional: He is oriented to person, place, and time. He appears well-developed and well-nourished. No distress.  HENT:  Mouth/Throat: Oropharynx is clear and moist. No oropharyngeal exudate.  Eyes: Right eye exhibits no discharge. Left eye exhibits no discharge. No scleral icterus.  Cardiovascular: Normal rate, regular rhythm and normal heart sounds.   No murmur heard. Pulmonary/Chest: Effort normal and breath sounds normal. No respiratory distress. He has no wheezes.  Lymphadenopathy:    He has no cervical adenopathy.  Neurological: He is alert and oriented to person, place, and time.  Skin: Skin is warm. No rash noted.          Assessment & Plan:

## 2015-03-22 NOTE — Assessment & Plan Note (Signed)
Will continue to monitor and send back to Dr. Marcello Moores if concerns arise.

## 2015-03-23 ENCOUNTER — Other Ambulatory Visit: Payer: Self-pay | Admitting: Licensed Clinical Social Worker

## 2015-03-23 DIAGNOSIS — B20 Human immunodeficiency virus [HIV] disease: Secondary | ICD-10-CM

## 2015-03-23 MED ORDER — ELVITEG-COBIC-EMTRICIT-TENOFDF 150-150-200-300 MG PO TABS: 1.0000 | ORAL_TABLET | Freq: Every day | ORAL | Status: DC

## 2015-03-27 ENCOUNTER — Other Ambulatory Visit: Payer: Self-pay | Admitting: *Deleted

## 2015-03-27 ENCOUNTER — Other Ambulatory Visit: Payer: Self-pay

## 2015-03-27 DIAGNOSIS — B20 Human immunodeficiency virus [HIV] disease: Secondary | ICD-10-CM

## 2015-03-27 MED ORDER — ELVITEG-COBIC-EMTRICIT-TENOFDF 150-150-200-300 MG PO TABS: 1.0000 | ORAL_TABLET | Freq: Every day | ORAL | Status: DC

## 2015-03-27 NOTE — Telephone Encounter (Signed)
Pharmacy calling for new script.  Patient has changed insurance.  [facility] .   Medication sent to pharmacy .   Laverle Patter, RN

## 2015-03-30 ENCOUNTER — Other Ambulatory Visit: Payer: Self-pay | Admitting: *Deleted

## 2015-03-30 DIAGNOSIS — B20 Human immunodeficiency virus [HIV] disease: Secondary | ICD-10-CM

## 2015-03-30 MED ORDER — ELVITEG-COBIC-EMTRICIT-TENOFDF 150-150-200-300 MG PO TABS: 1.0000 | ORAL_TABLET | Freq: Every day | ORAL | Status: DC

## 2015-06-14 ENCOUNTER — Other Ambulatory Visit: Payer: Managed Care, Other (non HMO)

## 2015-06-14 DIAGNOSIS — Z79899 Other long term (current) drug therapy: Secondary | ICD-10-CM

## 2015-06-14 DIAGNOSIS — B20 Human immunodeficiency virus [HIV] disease: Secondary | ICD-10-CM

## 2015-06-14 DIAGNOSIS — Z113 Encounter for screening for infections with a predominantly sexual mode of transmission: Secondary | ICD-10-CM

## 2015-06-14 LAB — CBC WITH DIFFERENTIAL/PLATELET
BASOS ABS: 0 10*3/uL (ref 0.0–0.1)
Basophils Relative: 0 % (ref 0–1)
Eosinophils Absolute: 0.1 10*3/uL (ref 0.0–0.7)
Eosinophils Relative: 2 % (ref 0–5)
HEMATOCRIT: 41.7 % (ref 39.0–52.0)
HEMOGLOBIN: 14.5 g/dL (ref 13.0–17.0)
LYMPHS PCT: 39 % (ref 12–46)
Lymphs Abs: 1.4 10*3/uL (ref 0.7–4.0)
MCH: 31.3 pg (ref 26.0–34.0)
MCHC: 34.8 g/dL (ref 30.0–36.0)
MCV: 90.1 fL (ref 78.0–100.0)
MPV: 10.1 fL (ref 8.6–12.4)
Monocytes Absolute: 0.4 10*3/uL (ref 0.1–1.0)
Monocytes Relative: 10 % (ref 3–12)
NEUTROS ABS: 1.7 10*3/uL (ref 1.7–7.7)
Neutrophils Relative %: 49 % (ref 43–77)
Platelets: 252 10*3/uL (ref 150–400)
RBC: 4.63 MIL/uL (ref 4.22–5.81)
RDW: 13.8 % (ref 11.5–15.5)
WBC: 3.5 10*3/uL — ABNORMAL LOW (ref 4.0–10.5)

## 2015-06-14 LAB — COMPLETE METABOLIC PANEL WITH GFR
ALT: 28 U/L (ref 9–46)
AST: 22 U/L (ref 10–40)
Albumin: 4.4 g/dL (ref 3.6–5.1)
Alkaline Phosphatase: 132 U/L — ABNORMAL HIGH (ref 40–115)
BUN: 8 mg/dL (ref 7–25)
CHLORIDE: 104 mmol/L (ref 98–110)
CO2: 29 mmol/L (ref 20–31)
CREATININE: 1.02 mg/dL (ref 0.60–1.35)
Calcium: 9.7 mg/dL (ref 8.6–10.3)
GFR, Est Non African American: 86 mL/min (ref >=60)
Glucose, Bld: 93 mg/dL (ref 65–99)
POTASSIUM: 4 mmol/L (ref 3.5–5.3)
Sodium: 140 mmol/L (ref 135–146)
Total Bilirubin: 0.6 mg/dL (ref 0.2–1.2)
Total Protein: 7.1 g/dL (ref 6.1–8.1)

## 2015-06-14 LAB — LIPID PANEL
CHOL/HDL RATIO: 3.4 ratio (ref <=5.0)
CHOLESTEROL: 178 mg/dL (ref 125–200)
HDL: 52 mg/dL (ref >=40)
LDL Cholesterol: 102 mg/dL (ref <130)
TRIGLYCERIDES: 122 mg/dL (ref <150)
VLDL: 24 mg/dL (ref <30)

## 2015-06-15 LAB — FLUORESCENT TREPONEMAL AB(FTA)-IGG-BLD: Fluorescent Treponemal ABS: REACTIVE — AB

## 2015-06-15 LAB — HIV-1 RNA QUANT-NO REFLEX-BLD

## 2015-06-15 LAB — URINE CYTOLOGY ANCILLARY ONLY
CHLAMYDIA, DNA PROBE: NEGATIVE
Neisseria Gonorrhea: NEGATIVE

## 2015-06-15 LAB — RPR: RPR Ser Ql: REACTIVE — AB

## 2015-06-15 LAB — RPR TITER: RPR Titer: 1:2 {titer}

## 2015-06-18 LAB — T-HELPER CELL (CD4) - (RCID CLINIC ONLY)
CD4 % Helper T Cell: 23 % — ABNORMAL LOW (ref 33–55)
CD4 T CELL ABS: 300 /uL — AB (ref 400–2700)

## 2015-06-28 ENCOUNTER — Ambulatory Visit (INDEPENDENT_AMBULATORY_CARE_PROVIDER_SITE_OTHER): Payer: Managed Care, Other (non HMO) | Admitting: Internal Medicine

## 2015-06-28 VITALS — BP 133/78 | HR 73 | Temp 98.3°F | Ht 71.0 in | Wt 163.0 lb

## 2015-06-28 DIAGNOSIS — Z21 Asymptomatic human immunodeficiency virus [HIV] infection status: Secondary | ICD-10-CM | POA: Diagnosis not present

## 2015-06-28 DIAGNOSIS — Z23 Encounter for immunization: Secondary | ICD-10-CM | POA: Diagnosis not present

## 2015-06-28 DIAGNOSIS — B20 Human immunodeficiency virus [HIV] disease: Secondary | ICD-10-CM

## 2015-06-28 DIAGNOSIS — Q439 Congenital malformation of intestine, unspecified: Secondary | ICD-10-CM

## 2015-06-28 DIAGNOSIS — K6282 Dysplasia of anus: Secondary | ICD-10-CM

## 2015-06-28 MED ORDER — ELVITEG-COBIC-EMTRICIT-TENOFAF 150-150-200-10 MG PO TABS: 1.0000 | ORAL_TABLET | Freq: Every day | ORAL | Status: DC

## 2015-06-28 NOTE — Assessment & Plan Note (Signed)
Doing great.  Will try to change to [facility] for better long term benefits.  RTC 4 months.

## 2015-06-28 NOTE — Assessment & Plan Note (Signed)
Encouraged him to go back to Dr. Marcello Moores of surgery.

## 2015-06-28 NOTE — Progress Notes (Signed)
  Subjective:    Patient ID: Travis Wheeler, male    DOB: 1966/02/16, 49 y.o.   MRN: [id number]  HPI He comes in for routine followup.  He continues on Stribild and denies any missed doses.   He is pleased with the regimen with no nausea or vomiting. No weight loss or diarrhea.  CD4 is 300 and viral load is again undetectable.  Feels great.     Has had grade 1 AIN and saw surgery and decided on serial exams.  Has not been able to return due to financial constraints.  No warts noted at the time. Some itchiness (? Hemorrhoids).    Review of Systems  Constitutional: Negative for fever, fatigue and unexpected weight change.  HENT: Negative for sore throat and trouble swallowing.   Eyes: Negative for visual disturbance.  Respiratory: Negative for cough and shortness of breath.   Cardiovascular: Negative for leg swelling.  Gastrointestinal: Negative for nausea, abdominal pain and diarrhea.  Musculoskeletal: Negative for myalgias and arthralgias.  Skin: Negative for rash.  Neurological: Negative for dizziness, light-headedness and headaches.  Hematological: Negative for adenopathy.  Psychiatric/Behavioral: Negative for dysphoric mood.       Objective:   Physical Exam  Constitutional: He is oriented to person, place, and time. He appears well-developed and well-nourished. No distress.  HENT:  Mouth/Throat: Oropharynx is clear and moist. No oropharyngeal exudate.  Eyes: Right eye exhibits no discharge. Left eye exhibits no discharge. No scleral icterus.  Cardiovascular: Normal rate, regular rhythm and normal heart sounds.   No murmur heard. Pulmonary/Chest: Effort normal and breath sounds normal. No respiratory distress. He has no wheezes.  Lymphadenopathy:    He has no cervical adenopathy.  Neurological: He is alert and oriented to person, place, and time.  Skin: Skin is warm. No rash noted.          Assessment & Plan:

## 2015-10-03 ENCOUNTER — Emergency Department
Admission: EM | Admit: 2015-10-03 | Discharge: 2015-10-03 | Disposition: A | Discharge status: Home / Self Care | Payer: Commercial Managed Care - POS | Attending: Emergency Medicine | Admitting: Emergency Medicine

## 2015-10-03 ENCOUNTER — Emergency Department: Payer: Commercial Managed Care - POS

## 2015-10-03 DIAGNOSIS — R0602 Shortness of breath: Secondary | ICD-10-CM | POA: Insufficient documentation

## 2015-10-03 DIAGNOSIS — R05 Cough: Secondary | ICD-10-CM | POA: Insufficient documentation

## 2015-10-03 DIAGNOSIS — J45909 Unspecified asthma, uncomplicated: Secondary | ICD-10-CM | POA: Insufficient documentation

## 2015-10-03 LAB — BASIC METABOLIC PANEL
Anion Gap: 13 mMol/L (ref 7.0–18.0)
BUN / Creatinine Ratio: 23.6 Ratio (ref 10.0–30.0)
BUN: 21 mg/dL (ref 7–22)
CO2: 25.8 mMol/L (ref 20.0–30.0)
Calcium: 9.1 mg/dL (ref 8.5–10.5)
Chloride: 102 mMol/L (ref 98–110)
Creatinine: 0.89 mg/dL (ref 0.80–1.30)
EGFR: >60 mL/min/{1.73_m2}
Glucose: 102 mg/dL — ABNORMAL HIGH (ref 70–99)
Osmolality Calc: 277 mOsm/kg (ref 275–300)
Potassium: 3.8 mMol/L (ref 3.5–5.3)
Sodium: 137 mMol/L (ref 136–147)

## 2015-10-03 LAB — CBC AND DIFFERENTIAL
Basophils %: 0.7 % (ref 0.0–3.0)
Basophils Absolute: 0 10*3/uL (ref 0.0–0.3)
Eosinophils %: 2.5 % (ref 0.0–7.0)
Eosinophils Absolute: 0.2 10*3/uL (ref 0.0–0.8)
Hematocrit: 44.7 % (ref 39.0–52.5)
Hemoglobin: 15 gm/dL (ref 13.0–17.5)
Lymphocytes Absolute: 1.5 10*3/uL (ref 0.6–5.1)
Lymphocytes: 22.7 % (ref 15.0–46.0)
MCH: 31 pg (ref 28–35)
MCHC: 34 gm/dL (ref 32–36)
MCV: 92 fL (ref 80–100)
MPV: 6.9 fL (ref 6.0–10.0)
Monocytes Absolute: 1 10*3/uL (ref 0.1–1.7)
Monocytes: 16.1 % — ABNORMAL HIGH (ref 3.0–15.0)
Neutrophils %: 58 % (ref 42.0–78.0)
Neutrophils Absolute: 3.7 10*3/uL (ref 1.7–8.6)
PLT CT: 212 10*3/uL (ref 130–440)
RBC: 4.83 10*6/uL (ref 4.00–5.70)
RDW: 12.5 % (ref 11.0–14.0)
WBC: 6.4 10*3/uL (ref 4.0–11.0)

## 2015-10-03 LAB — B-TYPE NATRIURETIC PEPTIDE: B-Natriuretic Peptide: <10 pg/mL (ref 0.0–100.0)

## 2015-10-03 LAB — VH I-STAT TROPONIN NOTIFICATION

## 2015-10-03 LAB — I-STAT TROPONIN: Troponin I I-Stat: <0.02 ng/mL (ref 0.00–0.02)

## 2015-10-03 MED ORDER — PREDNISONE 10 MG PO TABS
ORAL_TABLET | ORAL | Status: DC
Start: 2015-10-03 — End: 2016-05-12

## 2015-10-03 MED ORDER — ALBUTEROL SULFATE HFA 108 (90 BASE) MCG/ACT IN AERS
1.0000 | INHALATION_SPRAY | RESPIRATORY_TRACT | Status: DC | PRN
Start: 2015-10-03 — End: 2019-02-22

## 2015-10-03 MED ORDER — ALBUTEROL SULFATE HFA 108 (90 BASE) MCG/ACT IN AERS
INHALATION_SPRAY | RESPIRATORY_TRACT | Status: AC
Start: 2015-10-03 — End: ?
  Filled 2015-10-03: qty 1.06

## 2015-10-03 MED ORDER — PREDNISONE 20 MG PO TABS
ORAL_TABLET | ORAL | Status: AC
Start: 2015-10-03 — End: ?
  Filled 2015-10-03: qty 3

## 2015-10-03 MED ORDER — ALBUTEROL SULFATE HFA 108 (90 BASE) MCG/ACT IN AERS
2.0000 | INHALATION_SPRAY | RESPIRATORY_TRACT | Status: DC | PRN
Start: 2015-10-03 — End: 2015-10-03
  Administered 2015-10-03: 2 via RESPIRATORY_TRACT

## 2015-10-03 MED ORDER — ALBUTEROL-IPRATROPIUM 2.5-0.5 (3) MG/3ML IN SOLN
3.0000 mL | Freq: Once | RESPIRATORY_TRACT | Status: AC
Start: 2015-10-03 — End: 2015-10-03
  Administered 2015-10-03: 3 mL via RESPIRATORY_TRACT

## 2015-10-03 MED ORDER — ALBUTEROL-IPRATROPIUM 2.5-0.5 (3) MG/3ML IN SOLN
RESPIRATORY_TRACT | Status: AC
Start: 2015-10-03 — End: ?
  Filled 2015-10-03: qty 3

## 2015-10-03 MED ORDER — PREDNISONE 20 MG PO TABS
60.0000 mg | ORAL_TABLET | Freq: Once | ORAL | Status: AC
Start: 2015-10-03 — End: 2015-10-03
  Administered 2015-10-03: 60 mg via ORAL

## 2015-10-03 MED ORDER — SODIUM CHLORIDE 0.9 % IV BOLUS
500.0000 mL | Freq: Once | INTRAVENOUS | Status: AC
Start: 2015-10-03 — End: 2015-10-03
  Administered 2015-10-03: 500 mL via INTRAVENOUS

## 2015-10-03 NOTE — ED Notes (Signed)
Patient presents via triage, sent by PCP, with CC SOB. Patient reports onset approx. 48 hours. + Non-productive cough. Patient denies CP. Reports upper back pain, between shoulder blades. Patient reports SOB worse with activity. Patient states sent by PCP for further evaluation of possible PNA. Patient denies fevers, chills. Denies recent travel. Patient reports that he recently completed ABX treatment for "cough"--states he saw improvement initially but S/S have returned within the last 48 hours.

## 2015-10-03 NOTE — ED Provider Notes (Signed)
[facility] EMERGENCY DEPARTMENT History and Physical Exam      Patient Name: Allen Lane, Allen Lane  Encounter Date:  10/03/2015  Attending Physician: Theodora Blow, MD  PCP: Maryella Shivers, MD  Patient DOB:  12/12/1965  MRN:  [id number]  Room:  N10/NA10-A      History of Presenting Illness     Chief complaint: Shortness of Breath    HPI/ROS is limited by: none  HPI/ROS given by: patient and family    Location: Respiratory   Duration: 48 hours  Severity: moderate    Allen Lane is a 50 y.o. male who presents with shortness of breath and non-productive cough. He also complains of upper back pain, between his shoulder blades. Shortness of breath is worse with activity. Denies fever, chest pain, leg pain, or leg swelling. He recently completed a zpack for a cough. Patient's wife states that she got sick shortly after he did last week and then he got worse again.  Patient states that he had a breathing treatment at his PCP today which offered some relief     Review of Systems     Review of Systems   Constitutional: Negative for fever, chills, diaphoresis and fatigue.   HENT: Negative for congestion, rhinorrhea and sore throat.    Respiratory: Positive for cough and shortness of breath. Negative for chest tightness.    Cardiovascular: Negative for chest pain, palpitations and leg swelling.   Gastrointestinal: Negative for nausea, vomiting, abdominal pain, diarrhea and constipation.   Genitourinary: Negative for dysuria, hematuria and difficulty urinating.   Musculoskeletal: Positive for back pain. Negative for myalgias and arthralgias.   Skin: Negative for rash and wound.   Neurological: Negative for dizziness, syncope, speech difficulty, weakness, light-headedness, numbness and headaches.   Psychiatric/Behavioral: Negative for suicidal ideas. The patient is not nervous/anxious.       Allergies     Pt has No Known Allergies.    Medications     Current Outpatient Rx   Name  Route  Sig  Dispense  Refill   . aspirin 325 MG tablet     Oral    Take 325 mg by mouth daily.             Marland Kitchen atorvastatin (LIPITOR) 20 MG tablet    Oral    Take 20 mg by mouth daily.             Marland Kitchen LISINOPRIL PO    Oral    Take by mouth.             Marland Kitchen LOSARTAN POTASSIUM PO    Oral    Take by mouth.             . meclizine (ANTIVERT) 25 MG tablet    Oral    Take 1 tablet (25 mg total) by mouth every 6 (six) hours as needed for Dizziness.    30 tablet    0     . ranitidine (ZANTAC) 75 MG tablet    Oral    Take 75 mg by mouth 2 (two) times daily.             Marland Kitchen UNABLE TO FIND        Med Name: "myrebetriq" 25mg              . albuterol (PROVENTIL HFA;VENTOLIN HFA) 108 (90 Base) MCG/ACT inhaler    Inhalation    Inhale 1-2 puffs into the lungs every 4 (four) hours as needed for Wheezing.  1 Inhaler    2     . predniSONE (DELTASONE) 10 MG tablet        6 tablets po per day 3 days, then 4 tablets per day 3 days, then 2 tablets per day 3 days, then 1 tablet per day 3 days.    39 tablet    0     . tadalafil (CIALIS) 5 MG tablet    Oral    Take 5 mg by mouth daily as needed for Erectile Dysfunction.             Marland Kitchen UNABLE TO FIND        Med Name: "endarcyclor" to replace lisinopril, dose unknown by pt                  Past Medical History     Pt has a past medical history of Hypertension; Hyperlipidemia; and Gastroesophageal reflux disease.    Past Surgical History     Pt has no past surgical history on file.    Family History     The family history is not on file.    Social History     Pt reports that he has never smoked. He has never used smokeless tobacco. He reports that he drinks alcohol. He reports that he does not use illicit drugs.    Physical Exam     Blood pressure 113/68, pulse 84, temperature 98.8 F (37.1 C), temperature source Oral, resp. rate 11, height 1.727 m, weight 130.3 kg, SpO2 96 %.    Physical Exam   Constitutional: He is oriented to person, place, and time and well-developed, well-nourished, and in no distress. No distress.   HENT:   Head: Normocephalic and  atraumatic.   Mouth/Throat: Oropharynx is clear and moist.   Eyes: Conjunctivae and EOM are normal. Pupils are equal, round, and reactive to light.   Neck: Normal range of motion. Neck supple.   Cardiovascular: Normal rate and regular rhythm.  Exam reveals no gallop and no friction rub.    No murmur heard.  Pulmonary/Chest: Effort normal. No respiratory distress. He has wheezes. He has no rales.   Abdominal: Soft. He exhibits no distension. There is no tenderness.   Musculoskeletal: Normal range of motion. He exhibits no edema or tenderness.   Lymphadenopathy:     He has no cervical adenopathy.   Neurological: He is alert and oriented to person, place, and time.   Skin: Skin is warm and dry. No rash noted. He is not diaphoretic.   Psychiatric: Affect normal.   Nursing note and vitals reviewed.     Orders Placed     Orders Placed This Encounter   Procedures   . XR Chest 2 Views   . CBC and differential   . Basic Metabolic Panel   . BNP   . I-Stat Troponin   . i-Stat Troponin   . ECG 12 lead (Stat)       Diagnostic Results       The results of the diagnostic studies below have been reviewed by myself:    Labs  Results     Procedure Component Value Units Date/Time    i-Stat Troponin [[id number]] Collected:  10/03/15 1907    Specimen Information:  Blood Updated:  10/03/15 1919     Trop I, ISTAT <0.02 ng/mL     I-Stat Troponin [[id number]] Collected:  10/03/15 1629    Specimen Information:  ISTAT Updated:  10/03/15 1856     I-STAT  Notification Istat Notification     BNP [[id number]] Collected:  10/03/15 1629    Specimen Information:  Blood Updated:  10/03/15 1717     B-Natriuretic Peptide <10.0 pg/mL     Basic Metabolic Panel [[id number]]  (Abnormal) Collected:  10/03/15 1629    Specimen Information:  Plasma Updated:  10/03/15 1707     Sodium 137 mMol/L      Potassium 3.8 mMol/L      Chloride 102 mMol/L      CO2 25.8 mMol/L      Calcium 9.1 mg/dL      Glucose 308 (H) mg/dL      Creatinine 6.57 mg/dL      BUN 21 mg/dL       Anion Gap 84.6 mMol/L      BUN/Creatinine Ratio 23.6 Ratio      EGFR >60 mL/min/1.16m2      Osmolality Calc 277 mOsm/kg     CBC and differential [[id number]]  (Abnormal) Collected:  10/03/15 1629    Specimen Information:  Blood from Blood Updated:  10/03/15 1651     WBC 6.4 K/cmm      RBC 4.83 M/cmm      Hemoglobin 15.0 gm/dL      Hematocrit 32.4 %      MCV 92 fL      MCH 31 pg      MCHC 34 gm/dL      RDW 40.1 %      PLT CT 212 K/cmm      MPV 6.9 fL      NEUTROPHIL % 58.0 %      Lymphocytes 22.7 %      Monocytes 16.1 (H) %      Eosinophils % 2.5 %      Basophils % 0.7 %      Neutrophils Absolute 3.7 K/cmm      Lymphocytes Absolute 1.5 K/cmm      Monocytes Absolute 1.0 K/cmm      Eosinophils Absolute 0.2 K/cmm      BASO Absolute 0.0 K/cmm           Radiologic Studies  Radiology Results (24 Hour)     Procedure Component Value Units Date/Time    XR Chest 2 Views [[id number]] Collected:  10/03/15 1718    Order Status:  Completed Updated:  10/03/15 1720    Narrative:      Clinical History:  Reason For Exam:  Shortness of Breath  Patient is having a cough, shortness of breath and congestion. History of hypertension and GERD.     Examination:  Frontal and lateral views of the chest.    Comparison:  None available.    Findings:  Lungs are clear other than minimal basilar atelectasis.  Heart size and vascular normal.  No effusions or acute bony  abnormalities.      Impression:      No acute disease in the chest.    ReadingStation:[id number]          EKG: HR 96. Poor R wave progression; Inferior Q waves.     ED Course & Treatment     8:15 PM: Patient states he is feeling much better after single DuoNeb. Cough is improved. O2 sat is 98% on room air. Comfortable with to discharged home.      MDM / Critical Care     Blood pressure 113/68, pulse 84, temperature 98.8 F (37.1 C), temperature source Oral, resp. rate 11, height 1.727 m, weight 130.3 kg, SpO2 96 %.  The patient presents with shortness of breath. They are now clinically  well appearing and symptoms have improved. Serious or potentially life-threatening causes of shortness of breath like pneumonia with significant hypoxemia and/or sepsis, pneumothorax, cancer, or cardiac pathology were considered in the differential but seem unlikely. The patient appears appropriate for outpatient management with symptomatic treatment and primary care physician follow-up. Warned to return immediately for worsening symptoms or any concerns .Patient was well-appearing at time of disposition.  Diagnostic impression and plan were discussed with the patient and/or family.  If ordered the results of lab/radiology tests were discussed with the patient and/or family. All questions were answered and concerns addressed.    This chart was generated by an EMR and may contain errors or omissions not intended by the user.    Procedures     None    Diagnosis / Disposition     Clinical Impression  1. Reactive airway disease, unspecified asthma severity, uncomplicated        Disposition  ED Disposition     Discharge [name] discharge to home/self care.    Condition at disposition: Stable            Prescriptions  Discharge Medication List as of 10/03/2015  8:18 PM      START taking these medications    Details   predniSONE (DELTASONE) 10 MG tablet 6 tablets po per day 3 days, then 4 tablets per day 3 days, then 2 tablets per day 3 days, then 1 tablet per day 3 days., Print             The documentation recorded by my scribe, Darvin Neighbours, accurately reflects the services I personally performed and the decisions made by me. Theodora Blow MD.            Theodora Blow, MD  10/26/15 [phone]

## 2015-10-07 LAB — ECG 12-LEAD
P Wave Axis: 48 deg
P-R Interval: 148 ms
Patient Age: 49 years
Q-T Interval(Corrected): 430 ms
Q-T Interval: 340 ms
QRS Axis: -7 deg
QRS Duration: 86 ms
T Axis: 10 years
Ventricular Rate: 96 //min

## 2015-10-25 ENCOUNTER — Other Ambulatory Visit: Payer: Managed Care, Other (non HMO)

## 2015-10-25 DIAGNOSIS — B20 Human immunodeficiency virus [HIV] disease: Secondary | ICD-10-CM

## 2015-10-26 LAB — HIV-1 RNA QUANT-NO REFLEX-BLD: HIV-1 RNA Quant, Log: <1.3 Log copies/mL (ref <1.30)

## 2015-10-26 LAB — T-HELPER CELL (CD4) - (RCID CLINIC ONLY)
CD4 T CELL ABS: 300 /uL — AB (ref 400–2700)
CD4 T CELL HELPER: 20 % — AB (ref 33–55)

## 2015-11-08 ENCOUNTER — Ambulatory Visit: Payer: Managed Care, Other (non HMO) | Admitting: Internal Medicine

## 2015-11-15 ENCOUNTER — Ambulatory Visit (INDEPENDENT_AMBULATORY_CARE_PROVIDER_SITE_OTHER): Payer: Managed Care, Other (non HMO) | Admitting: Internal Medicine

## 2015-11-15 ENCOUNTER — Encounter: Payer: Self-pay | Admitting: Internal Medicine

## 2015-11-15 VITALS — BP 129/84 | HR 71 | Temp 98.2°F | Ht 71.0 in | Wt 171.0 lb

## 2015-11-15 DIAGNOSIS — Z21 Asymptomatic human immunodeficiency virus [HIV] infection status: Secondary | ICD-10-CM | POA: Diagnosis not present

## 2015-11-15 DIAGNOSIS — Z113 Encounter for screening for infections with a predominantly sexual mode of transmission: Secondary | ICD-10-CM

## 2015-11-15 DIAGNOSIS — K6282 Dysplasia of anus: Secondary | ICD-10-CM

## 2015-11-15 DIAGNOSIS — B20 Human immunodeficiency virus [HIV] disease: Secondary | ICD-10-CM

## 2015-11-15 DIAGNOSIS — Q439 Congenital malformation of intestine, unspecified: Secondary | ICD-10-CM | POA: Diagnosis not present

## 2015-11-15 MED ORDER — ELVITEG-COBIC-EMTRICIT-TENOFAF 150-150-200-10 MG PO TABS: 1.0000 | ORAL_TABLET | Freq: Every day | ORAL | Status: DC

## 2015-11-16 NOTE — Assessment & Plan Note (Signed)
Doing well.  RTC 4 months.   

## 2015-11-16 NOTE — Progress Notes (Signed)
  Subjective:    Patient ID: Travis Wheeler, male    DOB: 1966-07-19, 50 y.o.   MRN: [id number]  HPI He comes in for routine followup.   Previously on Stribild, changed to [place] and denies any missed doses.   He is pleased with the regimen with no nausea or vomiting. No weight loss or diarrhea.  CD4 remains at 300 and viral load is again undetectable.  Feels great.     Has had grade 1 AIN and saw surgery and decided on serial exams.  Has not been able to return due to financial constraints.  No warts noted at the time.    Review of Systems  Constitutional: Negative for fatigue.  HENT: Negative for trouble swallowing.   Gastrointestinal: Negative for diarrhea.  Skin: Negative for rash.  Neurological: Negative for dizziness, light-headedness and headaches.  Psychiatric/Behavioral: Negative for dysphoric mood.       Objective:   Physical Exam  Constitutional: He appears well-developed and well-nourished. No distress.  HENT:  Mouth/Throat: Oropharynx is clear and moist. No oropharyngeal exudate.  Eyes: Right eye exhibits no discharge. Left eye exhibits no discharge. No scleral icterus.  Cardiovascular: Normal rate, regular rhythm and normal heart sounds.   No murmur heard. Pulmonary/Chest: Effort normal and breath sounds normal. No respiratory distress. He has no wheezes.  Lymphadenopathy:    He has no cervical adenopathy.  Skin: No rash noted.          Assessment & Plan:

## 2015-11-16 NOTE — Assessment & Plan Note (Signed)
encouarged to go back to the surgeon

## 2016-03-04 ENCOUNTER — Other Ambulatory Visit: Payer: Managed Care, Other (non HMO)

## 2016-03-04 DIAGNOSIS — B20 Human immunodeficiency virus [HIV] disease: Secondary | ICD-10-CM

## 2016-03-04 DIAGNOSIS — Z113 Encounter for screening for infections with a predominantly sexual mode of transmission: Secondary | ICD-10-CM

## 2016-03-04 LAB — COMPLETE METABOLIC PANEL WITH GFR
ALT: 17 U/L (ref 9–46)
AST: 16 U/L (ref 10–35)
Albumin: 4.3 g/dL (ref 3.6–5.1)
Alkaline Phosphatase: 98 U/L (ref 40–115)
BUN: 12 mg/dL (ref 7–25)
CHLORIDE: 101 mmol/L (ref 98–110)
CO2: 21 mmol/L (ref 20–31)
Calcium: 9.1 mg/dL (ref 8.6–10.3)
Creat: 1.07 mg/dL (ref 0.70–1.33)
GFR, EST NON AFRICAN AMERICAN: 81 mL/min (ref >=60)
GLUCOSE: 97 mg/dL (ref 65–99)
Potassium: 4.1 mmol/L (ref 3.5–5.3)
SODIUM: 137 mmol/L (ref 135–146)
Total Bilirubin: 0.5 mg/dL (ref 0.2–1.2)
Total Protein: 7 g/dL (ref 6.1–8.1)

## 2016-03-04 LAB — CBC WITH DIFFERENTIAL/PLATELET
BASOS ABS: 0 {cells}/uL (ref 0–200)
BASOS PCT: 0 %
EOS ABS: 74 {cells}/uL (ref 15–500)
Eosinophils Relative: 2 %
HEMATOCRIT: 40.6 % (ref 38.5–50.0)
HEMOGLOBIN: 13.7 g/dL (ref 13.2–17.1)
LYMPHS ABS: 1406 {cells}/uL (ref 850–3900)
Lymphocytes Relative: 38 %
MCH: 31.6 pg (ref 27.0–33.0)
MCHC: 33.7 g/dL (ref 32.0–36.0)
MCV: 93.8 fL (ref 80.0–100.0)
MONO ABS: 296 {cells}/uL (ref 200–950)
MPV: 10.2 fL (ref 7.5–12.5)
Monocytes Relative: 8 %
NEUTROS ABS: 1924 {cells}/uL (ref 1500–7800)
Neutrophils Relative %: 52 %
PLATELETS: 231 10*3/uL (ref 140–400)
RBC: 4.33 MIL/uL (ref 4.20–5.80)
RDW: 12.8 % (ref 11.0–15.0)
WBC: 3.7 10*3/uL — ABNORMAL LOW (ref 3.8–10.8)

## 2016-03-05 LAB — T-HELPER CELL (CD4) - (RCID CLINIC ONLY)
CD4 % Helper T Cell: 25 % — ABNORMAL LOW (ref 33–55)
CD4 T Cell Abs: 380 /uL — ABNORMAL LOW (ref 400–2700)

## 2016-03-05 LAB — RPR TITER: RPR Titer: 1:2 {titer}

## 2016-03-05 LAB — URINE CYTOLOGY ANCILLARY ONLY
CHLAMYDIA, DNA PROBE: NEGATIVE
NEISSERIA GONORRHEA: NEGATIVE

## 2016-03-05 LAB — FLUORESCENT TREPONEMAL AB(FTA)-IGG-BLD: Fluorescent Treponemal ABS: REACTIVE — AB

## 2016-03-05 LAB — RPR: RPR: REACTIVE — AB

## 2016-03-05 LAB — HIV-1 RNA QUANT-NO REFLEX-BLD: HIV-1 RNA Quant, Log: <1.3 Log copies/mL (ref <1.30)

## 2016-03-18 ENCOUNTER — Ambulatory Visit: Payer: Managed Care, Other (non HMO) | Admitting: Internal Medicine

## 2016-03-27 ENCOUNTER — Ambulatory Visit (INDEPENDENT_AMBULATORY_CARE_PROVIDER_SITE_OTHER): Payer: Managed Care, Other (non HMO) | Admitting: Internal Medicine

## 2016-03-27 ENCOUNTER — Encounter: Payer: Self-pay | Admitting: Internal Medicine

## 2016-03-27 VITALS — BP 107/73 | HR 69 | Temp 98.4°F | Ht 71.0 in | Wt 176.0 lb

## 2016-03-27 DIAGNOSIS — Z21 Asymptomatic human immunodeficiency virus [HIV] infection status: Secondary | ICD-10-CM | POA: Diagnosis not present

## 2016-03-27 DIAGNOSIS — K6282 Dysplasia of anus: Secondary | ICD-10-CM

## 2016-03-27 DIAGNOSIS — B20 Human immunodeficiency virus [HIV] disease: Secondary | ICD-10-CM

## 2016-03-27 DIAGNOSIS — Q439 Congenital malformation of intestine, unspecified: Secondary | ICD-10-CM | POA: Diagnosis not present

## 2016-03-27 NOTE — Progress Notes (Signed)
  Subjective:    Patient ID: Travis Wheeler, male    DOB: December 01, 1965, 50 y.o.   MRN: [id number]  HPI He comes in for routine followup of HIV.   Previously on Stribild, changed to [place] and denies any missed doses.   He is pleased with the regimen with no nausea or vomiting. No weight loss or diarrhea.  CD4 remains at 380 and viral load is again undetectable.  Feels great.     Has had grade 1 AIN and saw surgery and decided on serial exams.  Has not been able to return due to financial constraints.  No warts noted at the time.    Review of Systems  Constitutional: Negative for fatigue.  HENT: Negative for trouble swallowing.   Gastrointestinal: Negative for diarrhea.  Skin: Negative for rash.  Neurological: Negative for dizziness, light-headedness and headaches.  Psychiatric/Behavioral: Negative for dysphoric mood.       Objective:   Physical Exam  Constitutional: He appears well-developed and well-nourished. No distress.  HENT:  Mouth/Throat: Oropharynx is clear and moist. No oropharyngeal exudate.  Eyes: Right eye exhibits no discharge. Left eye exhibits no discharge. No scleral icterus.  Cardiovascular: Normal rate, regular rhythm and normal heart sounds.   No murmur heard. Pulmonary/Chest: Effort normal and breath sounds normal. No respiratory distress. He has no wheezes.  Lymphadenopathy:    He has no cervical adenopathy.  Skin: No rash noted.          Assessment & Plan:

## 2016-03-27 NOTE — Assessment & Plan Note (Signed)
I again encouraged him to follow up with Dr Marcello Moores.

## 2016-03-27 NOTE — Assessment & Plan Note (Signed)
Doing great.  rtc 6 months.  

## 2016-05-12 ENCOUNTER — Emergency Department: Payer: Commercial Managed Care - POS

## 2016-05-12 ENCOUNTER — Emergency Department
Admission: EM | Admit: 2016-05-12 | Discharge: 2016-05-12 | Disposition: A | Discharge status: Home / Self Care | Payer: Commercial Managed Care - POS | Attending: Emergency Medicine | Admitting: Emergency Medicine

## 2016-05-12 DIAGNOSIS — S86911A Strain of unspecified muscle(s) and tendon(s) at lower leg level, right leg, initial encounter: Secondary | ICD-10-CM | POA: Insufficient documentation

## 2016-05-12 DIAGNOSIS — X58XXXA Exposure to other specified factors, initial encounter: Secondary | ICD-10-CM | POA: Insufficient documentation

## 2016-05-12 MED ORDER — HYDROCODONE-ACETAMINOPHEN 5-325 MG PO TABS
1.0000 | ORAL_TABLET | Freq: Once | ORAL | Status: AC
Start: 2016-05-12 — End: 2016-05-12
  Administered 2016-05-12: 1 via ORAL

## 2016-05-12 MED ORDER — HYDROCODONE-ACETAMINOPHEN 5-325 MG PO TABS
ORAL_TABLET | ORAL | Status: AC
Start: 2016-05-12 — End: ?
  Filled 2016-05-12: qty 1

## 2016-05-12 MED ORDER — TRAMADOL HCL 50 MG PO TABS
50.0000 mg | ORAL_TABLET | Freq: Four times a day (QID) | ORAL | Status: DC | PRN
Start: 2016-05-12 — End: 2017-04-08

## 2016-05-12 NOTE — ED Notes (Signed)
Pt presents to the ED with R. Knee pain. Pt stated that the R. Knee has been bothering him for about 3wks, but tonight it "gave out".

## 2016-05-12 NOTE — ED Provider Notes (Signed)
[facility]  EMERGENCY DEPARTMENT  History and Physical Exam     Patient Name: Allen Lane, Allen Lane  Encounter Date:  05/12/2016  Attending Physician: Elmon Else. Grace Isaac, M.D.   Room:  N9/N9-A  Patient DOB:  1966-03-05  Age: 50 y.o. male  MRN:  [id number]  PCP: Maryella Shivers, MD      MDM:  Diagnosis / Disposition:     This patient presents to the Emergency Department with extremity pain.   Evaluation and treatment for this patient was performed and revealed that there were no serious injuries identified in the ED, and no threat of loss of limb.  Differential diagnosis included sprain, fracture, arthritis, cellulitis, septic joint, dislocation. Any serious sequelae that would require admission and immediate surgical repair were thought unlikely, and the patient is stable for discharge home.  The diagnostic impression and plan and appropriate follow-up were discussed and agreed upon with the patient and/or family.  If performed the results of lab/radiology tests were reviewed and discussed, wrap was applied, and crutch training performed.  All questions were answered and concerns addressed.  Work/sports precautions have been given and the patient was warned to return immediately for worsening symptoms or any acute concerns and to follow up with an orthopaedic specialist within an appropriate time as discussed.    In addition to the above history, please see nursing notes. Allergies, meds, past medical, family, social hx, and the results of the diagnostic studies performed have been reviewed by myself.      Final Impression  1. Knee strain, right, initial encounter        Disposition  ED Disposition     Discharge Valarie Cones discharge to home/self care.    Condition at disposition: Stable            Follow up  Hendricks Milo, MD  [address]  [place] [address]  [phone]    Schedule an appointment as soon as possible for a visit        Prescriptions  Discharge Medication List as of  05/12/2016  4:05 AM      START taking these medications    Details   traMADol (ULTRAM) 50 MG tablet Take 1 tablet (50 mg total) by mouth every 6 (six) hours as needed for Pain.RX:  ___ out of ___ prescriptions, Starting 05/12/2016, Until Discontinued, Print               History of Presenting Illness:     Vitals: Blood pressure 161/81, pulse 73, temperature 98.1 F (36.7 C), temperature source Oral, resp. rate 18, height 1.727 m, weight 126.1 kg, SpO2 98 %.    Chief complaint: Knee Pain    HPI/ROS is limited by: none  HPI/ROS given by: patient    Location: knee  Quality: tenderness   Duration: weeks  Severity: mild    Allen Lane is a 50 y.o. male who presents to the ED with complaint of right knee pain.  Patient note right knee pain for around 3 weeks.  Patient notes intermittent episodes of pain.  Patient notes history of left knee issues.  Patient notes that he has been favoring it and believes that this cause more strain on his right knee and that caused the pain.  Patient notes that tonight, his knee buckled out from under him.  Patient denies any trauma, injury, or fall.  Patient denies other issues or complaints.  Patient has not see anyone for his knees before.  Review of Systems:  Physical Exam:     Review of Systems   Constitutional: Negative for fever, chills and diaphoresis.   HENT: Negative for congestion, ear pain, rhinorrhea and sore throat.    Eyes: Negative.    Respiratory: Negative for cough, chest tightness, shortness of breath and wheezing.    Cardiovascular: Negative for chest pain, palpitations and leg swelling.   Gastrointestinal: Negative for nausea, vomiting, abdominal pain, diarrhea and blood in stool.   Endocrine: Negative.    Genitourinary: Negative for dysuria, urgency, frequency, hematuria, flank pain, scrotal swelling, penile pain and testicular pain.   Musculoskeletal: Positive for arthralgias. Negative for myalgias, back pain and neck pain.   Skin: Negative for rash and  wound.   Allergic/Immunologic: Negative.    Neurological: Negative for dizziness, weakness, light-headedness and headaches.   Psychiatric/Behavioral: Negative for suicidal ideas, self-injury and dysphoric mood. The patient is not nervous/anxious.    All other systems reviewed and are negative.      Physical Exam   Constitutional: He is oriented to person, place, and time. He appears well-developed and well-nourished. No distress.   HENT:   Head: Normocephalic and atraumatic.   Nose: No rhinorrhea.   Eyes: Right eye exhibits no discharge. Left eye exhibits no discharge. Right conjunctiva is not injected. Left conjunctiva is not injected. No scleral icterus. Right pupil is round. Left pupil is round. Pupils are equal.   Pulmonary/Chest: Effort normal.   Abdominal: He exhibits no distension.   Musculoskeletal:        Right knee: He exhibits swelling. Tenderness found.        Left knee: He exhibits decreased range of motion. Tenderness found.   Neurological: He is alert and oriented to person, place, and time.   Skin: Skin is warm and dry. No rash noted. He is not diaphoretic. No erythema. No pallor.   Psychiatric: He has a normal mood and affect. His behavior is normal. Judgment and thought content normal.   Nursing note and vitals reviewed.         Allergies / Medications:     Pt has No Known Allergies.    Discharge Medication List as of 05/12/2016  4:05 AM      CONTINUE these medications which have NOT CHANGED    Details   albuterol (PROVENTIL HFA;VENTOLIN HFA) 108 (90 Base) MCG/ACT inhaler Inhale 1-2 puffs into the lungs every 4 (four) hours as needed for Wheezing., Starting 10/03/2015, Until Discontinued, Print      aspirin 325 MG tablet Take 325 mg by mouth daily., Until Discontinued, Historical Med      atorvastatin (LIPITOR) 20 MG tablet Take 20 mg by mouth daily., Until Discontinued, Historical Med      LISINOPRIL PO Take by mouth., Until Discontinued, Historical Med      LOSARTAN POTASSIUM PO Take by mouth.,  Until Discontinued, Historical Med      meclizine (ANTIVERT) 25 MG tablet Take 1 tablet (25 mg total) by mouth every 6 (six) hours as needed for Dizziness., Starting 11/20/2014, Until Discontinued, Print      ranitidine (ZANTAC) 75 MG tablet Take 75 mg by mouth 2 (two) times daily., Until Discontinued, Historical Med      tadalafil (CIALIS) 5 MG tablet Take 5 mg by mouth daily as needed for Erectile Dysfunction., Until Discontinued, Historical Med      !! UNABLE TO FIND Med Name: "endarcyclor" to replace lisinopril, dose unknown by pt, Until Discontinued, Historical Med      !!  UNABLE TO FIND Med Name: "myrebetriq" 25mg , Until Discontinued, Historical Med       !! - Potential duplicate medications found. Please discuss with provider.            Past History:     Medical: Pt has a past medical history of Hypertension; Hyperlipidemia; and Gastroesophageal reflux disease.    Surgical: Pt  has no past surgical history on file.    Family: The family history is not on file.    Social: Pt reports that he has never smoked. He has never used smokeless tobacco. He reports that he drinks alcohol. He reports that he does not use illicit drugs.        Diagnostic Results:     The results of the diagnostic studies have been reviewed by myself:    Radiologic Studies  No results found.    Lab Studies  Labs Reviewed - No data to display        Procedure / EKG / Image:     none        ED Course:     3:05 AM -- Advised of assessment. Advised of X-ray results. Advised of discharge plan, treatment plan, and follow up instructions. Was given a list of primary care doctors in the area who are accepting new patients if the patient does not have a PMD or would like a new one; in addition to any specialist care, if appropriate/needed.  Advised to return to the ER should symptoms persist or get worse. Answered all questions.        ATTESTATIONS     This chart was generated by an EMR and may contain errors or additions/omissions not intended by  the user.    The documentation recorded by my scribe, Jafet del Arcadia, which reflects the services I personally performed and the decisions made by me, Elmon Else. Grace Isaac, M.D.  I understand and acknowledge that I am responsible for the accuracy of the documentation    Elmon Else. Grace Isaac, M.D.         Etter Sjogren, MD  05/12/16 [phone]

## 2016-05-12 NOTE — Discharge Instructions (Signed)
Self-Care for Strains and Sprains  Most minor strains and sprains can be treated with self-care. Recovering from a strain or sprain may take 6 to 8 weeks. Your self-care goal is to reduce pain and immobilize the injury to speed healing.     A sprain injures ligaments (tissue that connects bones to bones).        A strain injures muscles or tendons (tissue that connects muscles to bones).   Support the injured area  Wrapping the injured area provides support for short, necessary activities. Be careful not to wrap the area too tightly. This could cut off the blood supply.   Support a wrist, elbow, or shoulder with a sling.   Wrap an ankle or knee with an elastic bandage.   Tape a finger or toe to the one next to it.  Use cold and heat  Cold reduces swelling. Both cold and heat reduce pain. Heat should not be used in the initial treatment of the injury. When using cold or heat, always place a towel between the pack and your skin.   Apply ice or a cold pack 10 to 15 minutes every hour you're awake for the first 2 days.   After the swelling goes down, use cold or heat to control pain. Don't use heat late in the day, since it can cause swelling when you're not active.  Rest and elevate  Rest and elevation help your injury heal faster.   Raise the injured area above your heart level.   Keep the injured area from moving.   Limit the use of the joint or limb.  Use medicine   Aspirin reduces pain and swelling. (Note: Don't give aspirin to a child 18 or younger unless prescribed by the doctor.)   Aspirin substitutes, such as ibuprofen, can reduce pain. Some substitutes reduce swelling, too. Ask your pharmacist which substitutes you can use.  Call your doctor if:   The injured joint won't move, or bones make a grating sound when they move.   You can't put weight on the injured area, even after 24 hours.   The injured body part is cold, blue, or numb.   The joint or limb appears bent or crooked.   Pain increases  or doesn't improve in 4 days.   When pressing along the injured area, you notice a spot that is especially painful.   Date Last Reviewed: 06/27/2014   2000-2016 [employer] [address], [place] [address]. All rights reserved. This information is not intended as a substitute for professional medical care. Always follow your healthcare professional's instructions.          Treating Strains and Sprains  Strains and sprains happen when muscles or other soft tissues near your bones stretch or tear. These injuries can cause bruising, swelling, and pain. To ease your discomfort and speed the healing of your strain or sprain, follow the tips below. Remember, a strain or sprain can take 6 to 8 weeks to heal.    Important Note: Do not give aspirin to children or teens without discussing it with your healthcare provider first.       Ice first, heat later   Use ice for the first 24 to 48 hours after injury. Ice helps prevent swelling and reduce pain. Ice the injury for no more than 20 minutes at a time and allow at least 20 minutes between icing sessions.   Apply heat after the first72 hours, once the swelling   has gone down. Heat relaxes muscles and increases blood flow. Soak the injured area in warm water or use a heating pad set on low for no more than 15 minutes at a time.  Wrap and elevate   Wrap an injured limb firmly with an elastic bandage. This provides support and helps prevent swelling. Don't wear an elastic bandage overnight. Watch for tingling, numbness, or increased pain, and remove the bandage immediately if any of these occurs.   Elevate the injured area to help reduce swelling and throbbing. It's best to raise an injured limb above the level of your heart.    Medicines   Over-the-counter medicines such as acetaminophen or ibuprofen can help reduce pain. Some also help reduce swelling.   Take medicine only as directed.   Rest the area even if medicines are controlling the  pain.  Rest   Rest the injured area by not using it for 24 hours.   When you're ready, return slowly to your normal activities. Rest the injured area often.   Don't use or walk on an injured limb if it hurts.  Date Last Reviewed: 06/01/2014   2000-2016 [employer] [address], [place] [address]. All rights reserved. This information is not intended as a substitute for professional medical care. Always follow your healthcare professional's instructions.

## 2016-05-13 NOTE — ED Notes (Signed)
Records faxed to Dr. Malen Gauze in [place] for orthopedic follow-up per patient request.

## 2016-07-01 ENCOUNTER — Emergency Department: Payer: Commercial Managed Care - POS

## 2016-07-01 ENCOUNTER — Emergency Department
Admission: EM | Admit: 2016-07-01 | Discharge: 2016-07-01 | Disposition: A | Discharge status: Home / Self Care | Payer: Commercial Managed Care - POS | Attending: Emergency Medicine | Admitting: Emergency Medicine

## 2016-07-01 DIAGNOSIS — I10 Essential (primary) hypertension: Secondary | ICD-10-CM | POA: Insufficient documentation

## 2016-07-01 LAB — I-STAT CHEM 8 CARTRIDGE
Anion Gap I-Stat: 18 — ABNORMAL HIGH (ref 7–16)
BUN I-Stat: 12 mg/dL (ref 6–20)
Calcium Ionized I-Stat: 4.5 mg/dL (ref 4.35–5.10)
Chloride I-Stat: 102 mMol/L (ref 98–112)
Creatinine I-Stat: 0.8 mg/dL — ABNORMAL LOW (ref 0.90–1.30)
EGFR: 104 mL/min/{1.73_m2} (ref 60–150)
Glucose I-Stat: 112 mg/dL — ABNORMAL HIGH (ref 70–99)
Hematocrit I-Stat: 45 % (ref 39.0–52.5)
Hemoglobin I-Stat: 15.3 gm/dL (ref 13.0–17.5)
Potassium I-Stat: 4 mMol/L (ref 3.5–5.3)
Sodium I-Stat: 140 mMol/L (ref 135–145)
TCO2 I-Stat: 25 mMol/L (ref 24–29)

## 2016-07-01 LAB — CBC AND DIFFERENTIAL
Basophils %: 0.5 % (ref 0.0–3.0)
Basophils Absolute: 0 10*3/uL (ref 0.0–0.3)
Eosinophils %: 4.4 % (ref 0.0–7.0)
Eosinophils Absolute: 0.2 10*3/uL (ref 0.0–0.8)
Hematocrit: 45.5 % (ref 39.0–52.5)
Hemoglobin: 15.2 gm/dL (ref 13.0–17.5)
Lymphocytes Absolute: 1.5 10*3/uL (ref 0.6–5.1)
Lymphocytes: 30.1 % (ref 15.0–46.0)
MCH: 31 pg (ref 28–35)
MCHC: 33 gm/dL (ref 32–36)
MCV: 93 fL (ref 80–100)
MPV: 7.1 fL (ref 6.0–10.0)
Monocytes Absolute: 0.4 10*3/uL (ref 0.1–1.7)
Monocytes: 7.6 % (ref 3.0–15.0)
Neutrophils %: 57.4 % (ref 42.0–78.0)
Neutrophils Absolute: 2.8 10*3/uL (ref 1.7–8.6)
PLT CT: 208 10*3/uL (ref 130–440)
RBC: 4.89 10*6/uL (ref 4.00–5.70)
RDW: 12.6 % (ref 11.0–14.0)
WBC: 4.9 10*3/uL (ref 4.0–11.0)

## 2016-07-01 LAB — VH I-STAT CHEM 8 NOTIFICATION

## 2016-07-01 NOTE — Discharge Instructions (Signed)
Diabetes and Heart Disease     Take your medicines as directed each day, even if you feel fine.   If you have diabetes, you aretwo to four times more likely to have heart disease than someone without diabetes. This higher risk is due to diabetes, but it is also due to other risk factors for heart disease that happen in people with diabetes. But there's good news. You can helpcontrol your health risks by making some changes in your life. You can take steps to reduce your risk of heart disease by half--similar to the risk in people who don't have diabetes.  Your main risk factors  Three major risk factors for heart disease are high blood sugar, high blood pressure, and high levels of lipids. By keeping risk factors under control, you can help keep your heart and arteries healthy. This may reduce your chances of a heart attack.   Blood sugar.High blood sugar can make artery walls tough and rough. Plaque (waxy material in the blood) can then build up along the artery walls, making it harder for blood to flow through the arteries. Having high blood sugar increases the chances of having high blood pressure and high cholesterol.   Blood pressure. When blood pressure is high all the time it causes your heart to work harder to pump blood. Artery walls become damaged. This increases the risk for plaque build up.   Lipids.The body needs some lipids in the blood to stay healthy. But lipid levels that are too high can damage the artery walls. Lipids include cholesterol and triglycerides. There are two kinds of cholesterol. LDL ("bad") cholesterol can damage the arteries. But HDL ("good") cholesterol helps clear LDL cholesterol from the blood vessels. This helps keep the arteries healthy. When blood sugar is high, the level of triglycerides in the blood may also be high. High blood triglyceride levelscan cause plaque to form.  Other risk factors  Certain lifestyle factors can increase levels of your blood sugar, blood  pressure, and lipids. Such increases raise your risk of heart disease:   Smokingdamages the lining of your arteries. This allows plaque to build up in the artery walls. Smoking also constricts (narrows) the arteries. This can raise blood pressure and cause chest pain or angina. Smoking also increases your risk of getting type 2 diabetes.   Not being activemakes it harder for your heart to do its work. Inactivity is linked to many other risk factors, such as high blood pressure and poor cholesterol levels. Inactivity also increases your risk of getting type 2 diabetes.   Being overweightmakes it harder for your body to use insulin. It also makes your heart work too hard. Being overweight is also the main contributor to the development of type 2 diabetes,  Changes you can make  Following a few simple steps can help keep your risk factors under control. Work with your healthcare team to reach your goals.   Quitting smokingcould save your life. Smoking damages the lining of the blood vessels and raises blood pressure. Smoking also affects how your body uses insulin. This makes it harder to keep blood sugar under control. If you smoke and need help quitting, talk to your healthcare team.   Testing your blood sugar is the only way to know whether it is under control. Be sure to test your blood sugar yourself. Also get your blood tested in the lab, as directed.   Monitoring your blood pressure and lipid levels can help you achieve safe levels.   Visit your healthcare team as scheduled.   Taking medicines as directed can help control blood sugar, blood pressure, blood clotting, and/or cholesterol levels.   Eating rightcan reduce your risk factors and help you lose weight. Try to limit the amount of processed or refined carbohydrates you eat at one time. Cut back on your total calorie intake. Eat foods low in saturated fat and cholesterol. Eat fiber, including vegetables and whole grains, and cut down on salt. A  dietitian or diabetes educator can help form a meal plan that works for you--even if you are on a low budget.   Being activecan help reduce your weight, strengthen your heart, and lower your lipid levels and blood pressure. Exercise and activity are good for your whole body. Talk to your healthcare team about increasing your activity safely over time.   Keeping your appointmentswith your healthcare provider helps you stay healthy. Go in for checkups and lab tests as scheduled.  Date Last Reviewed: 02/15/2015   2000-2016 [employer] [address], [place] [address]. All rights reserved. This information is not intended as a substitute for professional medical care. Always follow your healthcare professional's instructions.

## 2016-07-01 NOTE — ED Provider Notes (Signed)
[facility]  EMERGENCY DEPARTMENT  History and Physical Exam       Patient Name: HORST, OSTERMILLER  Encounter Date:  07/01/2016  Physician Assistant: Sherran Needs, PA-C  Attending Physician: Myna Bright, MD  PCP: Maryella Shivers, MD  Patient DOB:  [birth year]  MRN:  [id number]  Room:  S27/S27-A    History of Presenting Illness     Chief complaint: Hypertension    HPI/ROS given by: Patient    Sufyaan Palma is a 50 y.o. male who presents with hypertension. The patient stated that 2 days prior to his arrival at the ED he was in his doctors office for prescreening for a total knee replacement and they recorded a high Bp. The patient stated that the next day he had a high reading (180/110) at home. He stated that his doctor wanted him to come to the ER to make sure nothing is happening. The patient stated that when he took his BP at home he stated that was prior to taking his BP medication.He stated that he thinks his BP has been high due to increased stress in his life and the added stress of surgery. He denied any Cp, Tb, SOB. He also denied any bowel or bladder issues, nausea, vomiting, diarrhea. He also denied any diploplia, or vision changes.     Review of Systems     Review of Systems   Constitutional: Negative for fatigue and fever.   Respiratory: Negative for apnea, chest tightness, shortness of breath and wheezing.    Cardiovascular: Negative for chest pain and palpitations.   Gastrointestinal: Negative for abdominal pain, diarrhea, nausea and vomiting.   Musculoskeletal: Negative for arthralgias, back pain and neck pain.   Neurological: Negative for dizziness, syncope, weakness, light-headedness, numbness and headaches.   Hematological: Negative for adenopathy.   Psychiatric/Behavioral: Negative for agitation.        Allergies & Medications     Pt has No Known Allergies.    Current/Home Medications    ALBUTEROL (PROVENTIL HFA;VENTOLIN HFA) 108 (90 BASE) MCG/ACT INHALER    Inhale 1-2  puffs into the lungs every 4 (four) hours as needed for Wheezing.    ASPIRIN 325 MG TABLET    Take 325 mg by mouth daily.    ATORVASTATIN (LIPITOR) 20 MG TABLET    Take 20 mg by mouth daily.    LISINOPRIL PO    Take by mouth.    LOSARTAN POTASSIUM PO    Take by mouth.    MECLIZINE (ANTIVERT) 25 MG TABLET    Take 1 tablet (25 mg total) by mouth every 6 (six) hours as needed for Dizziness.    RANITIDINE (ZANTAC) 75 MG TABLET    Take 75 mg by mouth 2 (two) times daily.    TRAMADOL (ULTRAM) 50 MG TABLET    Take 1 tablet (50 mg total) by mouth every 6 (six) hours as needed for Pain.RX:  ___ out of ___ prescriptions        Past Medical History     Pt has a past medical history of Gastroesophageal reflux disease; Hyperlipidemia; and Hypertension.     Past Surgical History     Pt  has no past surgical history on file.     Family History     The family history is not on file.     Social History     Pt reports that he has never smoked. He has never used smokeless tobacco. He reports that he drinks  alcohol. He reports that he does not use drugs.     Physical Exam     Blood pressure 112/49, pulse 63, temperature 97.9 F (36.6 C), temperature source Tympanic, resp. rate 16, weight 126.6 kg, SpO2 98 %.    Constitutional: Well developed, well nourished, active, in no apparent distress.  HENT:   Head: Normocephalic, atraumatic  Ears: No external lesions. TMS clear. No discharge or drainage present. No foreign body present.  Nose: No external lesions. No epistaxis or drainage.  Eyes: PERRL. No scleral icterus. No conjunctival injection. EOMI. No papilledema present.  Neck: Trachea is midline. No JVD. Normal range of motion. No apparent masses. No posterior/anterior cervical adenopathy. No Midline tenderness, crepitus and no step off noted.  Cardiovascular: Regular rhythm and rate, S1 normal and S2 normal. No murmur, gallops or rubs heard  Pulmonary/Chest: Effort normal. Lungs clear to auscultation bilaterally. No chest wall  tenderness. Even chest rise and fall upon respiration.  Abdominal: Soft, non-tender, non-distended. No masses. No hepatosplenomegaly. No ecchymosis noted.  Genitourinay/Anorectal: Defferred  Musculoskeletal: Normal range of motion. No deformity or apparent injury. Capillary refill less than 2 seconds.  Neurological: Pt is alert. Cranial nerves are grossly intact. Moving all extremities without apparent deficit. Pulse motor and sensory intact in all extremities  Psychiatric: Affect is appropriate. There is no agitation.   Skin: Skin is warm, dry, well perfused. No rash noted. No cyanosis. No pallor.     Diagnostic Results     The results of the diagnostic studies below have been reviewed by myself:    Labs  Results     Procedure Component Value Units Date/Time    CBC [[id number]] Collected:  07/01/16 1528    Specimen:  Blood from Blood Updated:  07/01/16 1600     WBC 4.9 K/cmm      RBC 4.89 M/cmm      Hemoglobin 15.2 gm/dL      Hematocrit 19.1 %      MCV 93 fL      MCH 31 pg      MCHC 33 gm/dL      RDW 47.8 %      PLT CT 208 K/cmm      MPV 7.1 fL      NEUTROPHIL % 57.4 %      Lymphocytes 30.1 %      Monocytes 7.6 %      Eosinophils % 4.4 %      Basophils % 0.5 %      Neutrophils Absolute 2.8 K/cmm      Lymphocytes Absolute 1.5 K/cmm      Monocytes Absolute 0.4 K/cmm      Eosinophils Absolute 0.2 K/cmm      BASO Absolute 0.0 K/cmm     i-Stat Chem 8 CartrIDge [[id number]]  (Abnormal) Collected:  07/01/16 1539    Specimen:  Blood Updated:  07/01/16 1543     i-STAT Sodium 140 mMol/L      i-STAT Potassium 4.0 mMol/L      i-STAT Chloride 102 mMol/L      TCO2, ISTAT 25 mMol/L      Ionized Ca, ISTAT 4.50 mg/dL      i-STAT Glucose 657 (H) mg/dL      i-STAT Creatinine 0.80 (L) mg/dL      i-STAT BUN 12 mg/dL      Anion Gap, ISTAT 84.6 (H)     EGFR 104 mL/min/1.77m2      i-STAT Hematocrit 45.0 %  i-STAT Hemoglobin 15.3 gm/dL     I-Stat Chem 8 [[id number]] Collected:  07/01/16 1532    Specimen:  ISTAT Updated:  07/01/16 1538      I-STAT Notification Istat Notification          Radiologic Studies  No results found.    EKG:      ED Course and Medical Decision Making     ED Medication Orders     None        Differential diagnosis to include, uncontrolled hypertension, end stage renal disease, Mi.    The patient is a 50 year old male who presents with hypertension. The patient denied SOB, Tb, and Cp. The patient also stated that he has had two high BP readings and his doctor wanted him to go to the Er. The patient stated that due to the added stress in his personal life and the stress of getting ready to have surgery, he thinks that is why is BP was high. Blood work was unremarkable and patient had a normal EKG. Patient was advised to follow up with his PCP to talk about his blood pressure medication.    In addition to the above history, please see nursing notes. Allergies, meds, past medical, family, social hx, and the results of the diagnostic studies performed have been reviewed by myself.    This chart was generated by an EMR and may contain errors or omissions not intended by the user.     Procedures / Critical Care     None     Diagnosis / Disposition     Clinical Impression  1. Essential hypertension        Disposition  ED Disposition     ED Disposition Condition Date/Time Comment    Discharge  Tue Jul 01, 2016  4:17 PM Valarie Cones discharge to home/self care.    Condition at disposition: Stable          Follow up for Discharged Patients  Maryella Shivers, MD  [address]  [place] [address]  [phone]    In 1 week  As needed      Prescriptions for Discharged Patients  New Prescriptions    No medications on file                 Thalia Bloodgood, [place]  07/01/16 2130

## 2016-07-01 NOTE — ED Notes (Signed)
Patient given d/c instructions. Questions answered.

## 2016-07-03 LAB — ECG 12-LEAD
P Wave Axis: 15 deg
P-R Interval: 162 ms
Patient Age: 50 years
Q-T Interval(Corrected): 413 ms
Q-T Interval: 394 ms
QRS Axis: -12 deg
QRS Duration: 89 ms
T Axis: 22 years
Ventricular Rate: 66 //min

## 2016-08-05 ENCOUNTER — Other Ambulatory Visit
Admission: RE | Admit: 2016-08-05 | Discharge: 2016-08-05 | Disposition: A | Discharge status: Home / Self Care | Payer: Commercial Managed Care - POS | Source: Ambulatory Visit | Attending: General Practice | Admitting: General Practice

## 2016-08-05 ENCOUNTER — Other Ambulatory Visit: Payer: Self-pay | Admitting: General Practice

## 2016-08-05 DIAGNOSIS — L918 Other hypertrophic disorders of the skin: Secondary | ICD-10-CM

## 2016-09-03 ENCOUNTER — Other Ambulatory Visit: Payer: Managed Care, Other (non HMO)

## 2016-09-03 DIAGNOSIS — B20 Human immunodeficiency virus [HIV] disease: Secondary | ICD-10-CM

## 2016-09-04 LAB — T-HELPER CELL (CD4) - (RCID CLINIC ONLY)
CD4 % Helper T Cell: 22 % — ABNORMAL LOW (ref 33–55)
CD4 T Cell Abs: 300 /uL — ABNORMAL LOW (ref 400–2700)

## 2016-09-05 LAB — HIV-1 RNA QUANT-NO REFLEX-BLD
HIV 1 RNA QUANT: 93 {copies}/mL — AB (ref <20)
HIV-1 RNA QUANT, LOG: 1.97 {Log_copies}/mL — AB (ref <1.30)

## 2016-09-16 ENCOUNTER — Ambulatory Visit (INDEPENDENT_AMBULATORY_CARE_PROVIDER_SITE_OTHER): Payer: Managed Care, Other (non HMO) | Admitting: Internal Medicine

## 2016-09-16 ENCOUNTER — Encounter: Payer: Self-pay | Admitting: Internal Medicine

## 2016-09-16 VITALS — BP 126/81 | HR 85 | Temp 98.8°F | Ht 70.0 in | Wt 188.0 lb

## 2016-09-16 DIAGNOSIS — B20 Human immunodeficiency virus [HIV] disease: Secondary | ICD-10-CM | POA: Diagnosis not present

## 2016-09-16 DIAGNOSIS — Z113 Encounter for screening for infections with a predominantly sexual mode of transmission: Secondary | ICD-10-CM

## 2016-09-16 DIAGNOSIS — K6282 Dysplasia of anus: Secondary | ICD-10-CM

## 2016-09-16 NOTE — Progress Notes (Signed)
  Subjective:    Patient ID: Travis Wheeler, male    DOB: 1966/05/31, 50 y.o.   MRN: [id number]  HPI He comes in for routine followup of HIV.   Continues on Genvoya and denies any missed doses.   He is pleased with the regimen with no nausea or vomiting. No weight loss or diarrhea.  CD4 remains at 300 and viral load 93 copies.  Feels great.     Recently had a colonoscopy.    Review of Systems  Constitutional: Negative for fatigue.  HENT: Negative for trouble swallowing.   Gastrointestinal: Negative for diarrhea.  Skin: Negative for rash.  Neurological: Negative for dizziness, light-headedness and headaches.  Psychiatric/Behavioral: Negative for dysphoric mood.       Objective:   Physical Exam  Constitutional: He appears well-developed and well-nourished. No distress.  HENT:  Mouth/Throat: Oropharynx is clear and moist. No oropharyngeal exudate.  Eyes: Right eye exhibits no discharge. Left eye exhibits no discharge. No scleral icterus.  Cardiovascular: Normal rate, regular rhythm and normal heart sounds.   No murmur heard. Pulmonary/Chest: Effort normal and breath sounds normal. No respiratory distress. He has no wheezes.  Lymphadenopathy:    He has no cervical adenopathy.  Skin: No rash noted.          Assessment & Plan:

## 2016-09-16 NOTE — Assessment & Plan Note (Signed)
Stable CD4.  rtc 6 months.

## 2016-09-16 NOTE — Assessment & Plan Note (Signed)
Followed by surgery 

## 2016-09-25 ENCOUNTER — Other Ambulatory Visit: Payer: Self-pay | Admitting: *Deleted

## 2016-09-25 DIAGNOSIS — B20 Human immunodeficiency virus [HIV] disease: Secondary | ICD-10-CM

## 2016-09-25 MED ORDER — ELVITEG-COBIC-EMTRICIT-TENOFAF 150-150-200-10 MG PO TABS: 1.0000 | ORAL_TABLET | Freq: Every day | ORAL | 3 refills | Status: DC

## 2016-10-01 ENCOUNTER — Emergency Department (HOSPITAL_COMMUNITY): Payer: BLUE CROSS/BLUE SHIELD

## 2016-10-01 ENCOUNTER — Encounter (HOSPITAL_COMMUNITY): Payer: Self-pay | Admitting: Emergency Medicine

## 2016-10-01 ENCOUNTER — Observation Stay (HOSPITAL_COMMUNITY)
Admission: EM | Admit: 2016-10-01 | Discharge: 2016-10-02 | Disposition: A | Discharge status: Home / Self Care | Payer: BLUE CROSS/BLUE SHIELD | Attending: Family Medicine | Admitting: Family Medicine

## 2016-10-01 DIAGNOSIS — Z86711 Personal history of pulmonary embolism: Secondary | ICD-10-CM | POA: Diagnosis not present

## 2016-10-01 DIAGNOSIS — Z79899 Other long term (current) drug therapy: Secondary | ICD-10-CM | POA: Insufficient documentation

## 2016-10-01 DIAGNOSIS — R05 Cough: Secondary | ICD-10-CM | POA: Diagnosis not present

## 2016-10-01 DIAGNOSIS — B20 Human immunodeficiency virus [HIV] disease: Secondary | ICD-10-CM | POA: Insufficient documentation

## 2016-10-01 DIAGNOSIS — I2699 Other pulmonary embolism without acute cor pulmonale: Principal | ICD-10-CM | POA: Diagnosis present

## 2016-10-01 DIAGNOSIS — R000 Tachycardia, unspecified: Secondary | ICD-10-CM | POA: Diagnosis not present

## 2016-10-01 LAB — CBC WITH DIFFERENTIAL/PLATELET
BASOS ABS: 0 10*3/uL (ref 0.0–0.1)
Basophils Relative: 0 %
Eosinophils Absolute: 0 10*3/uL (ref 0.0–0.7)
Eosinophils Relative: 0 %
HEMATOCRIT: 40.3 % (ref 39.0–52.0)
HEMOGLOBIN: 14.3 g/dL (ref 13.0–17.0)
Lymphocytes Relative: 12 %
Lymphs Abs: 1.3 10*3/uL (ref 0.7–4.0)
MCH: 31.3 pg (ref 26.0–34.0)
MCHC: 35.5 g/dL (ref 30.0–36.0)
MCV: 88.2 fL (ref 78.0–100.0)
Monocytes Absolute: 1 10*3/uL (ref 0.1–1.0)
Monocytes Relative: 9 %
NEUTROS ABS: 8.6 10*3/uL — AB (ref 1.7–7.7)
NEUTROS PCT: 79 %
Platelets: 250 10*3/uL (ref 150–400)
RBC: 4.57 MIL/uL (ref 4.22–5.81)
RDW: 12.2 % (ref 11.5–15.5)
WBC: 11 10*3/uL — AB (ref 4.0–10.5)

## 2016-10-01 LAB — BASIC METABOLIC PANEL
ANION GAP: 7 (ref 5–15)
BUN: 7 mg/dL (ref 6–20)
CALCIUM: 9.3 mg/dL (ref 8.9–10.3)
CHLORIDE: 104 mmol/L (ref 101–111)
CO2: 26 mmol/L (ref 22–32)
Creatinine, Ser: 0.99 mg/dL (ref 0.61–1.24)
GFR calc non Af Amer: >60 mL/min (ref >60)
Glucose, Bld: 108 mg/dL — ABNORMAL HIGH (ref 65–99)
Potassium: 3.8 mmol/L (ref 3.5–5.1)
Sodium: 137 mmol/L (ref 135–145)

## 2016-10-01 LAB — I-STAT TROPONIN, ED: TROPONIN I, POC: 0 ng/mL (ref 0.00–0.08)

## 2016-10-01 LAB — D-DIMER, QUANTITATIVE (NOT AT ARMC): D DIMER QUANT: 2.63 ug{FEU}/mL — AB (ref 0.00–0.50)

## 2016-10-01 MED ORDER — IOPAMIDOL (ISOVUE-370) INJECTION 76%
100.0000 mL | Freq: Once | INTRAVENOUS | Status: AC | PRN
  Administered 2016-10-01: 75 mL via INTRAVENOUS

## 2016-10-01 MED ORDER — HEPARIN BOLUS VIA INFUSION
5000.0000 [IU] | Freq: Once | INTRAVENOUS | Status: AC
  Administered 2016-10-01: 5000 [IU] via INTRAVENOUS
  Filled 2016-10-01: qty 5000

## 2016-10-01 MED ORDER — ELVITEG-COBIC-EMTRICIT-TENOFAF 150-150-200-10 MG PO TABS
1.0000 | ORAL_TABLET | Freq: Every day | ORAL | Status: DC
Start: 2016-10-02 — End: 2016-10-02
  Administered 2016-10-02: 1 via ORAL
  Filled 2016-10-01: qty 1

## 2016-10-01 MED ORDER — HEPARIN (PORCINE) IN NACL 100-0.45 UNIT/ML-% IJ SOLN
1400.0000 [IU]/h | INTRAMUSCULAR | Status: DC
  Administered 2016-10-01: 1400 [IU]/h via INTRAVENOUS
  Filled 2016-10-01: qty 250

## 2016-10-01 MED ORDER — MORPHINE SULFATE (PF) 4 MG/ML IV SOLN
4.0000 mg | Freq: Once | INTRAVENOUS | Status: AC
  Administered 2016-10-01: 4 mg via INTRAVENOUS
  Filled 2016-10-01: qty 1

## 2016-10-01 MED ORDER — OLOPATADINE HCL 0.1 % OP SOLN: 1.0000 [drp] | Freq: Two times a day (BID) | OPHTHALMIC | Status: DC | PRN

## 2016-10-01 MED ORDER — APIXABAN 2.5 MG PO TABS: 2.5000 mg | ORAL_TABLET | Freq: Two times a day (BID) | ORAL | Status: DC

## 2016-10-01 MED ORDER — MORPHINE SULFATE (PF) 2 MG/ML IV SOLN
2.0000 mg | INTRAVENOUS | Status: DC | PRN
  Administered 2016-10-01 – 2016-10-02 (×3): 2 mg via INTRAVENOUS
  Filled 2016-10-01 (×3): qty 1

## 2016-10-01 MED ORDER — APIXABAN 5 MG PO TABS
5.0000 mg | ORAL_TABLET | Freq: Two times a day (BID) | ORAL | Status: DC
  Administered 2016-10-01 – 2016-10-02 (×2): 5 mg via ORAL
  Filled 2016-10-01 (×2): qty 1

## 2016-10-01 MED ORDER — OXYCODONE HCL 5 MG PO TABS
5.0000 mg | ORAL_TABLET | Freq: Four times a day (QID) | ORAL | Status: DC | PRN
  Administered 2016-10-01 – 2016-10-02 (×3): 5 mg via ORAL
  Filled 2016-10-01 (×3): qty 1

## 2016-10-01 MED ORDER — SODIUM CHLORIDE 0.9 % IV BOLUS (SEPSIS)
1000.0000 mL | Freq: Once | INTRAVENOUS | Status: AC
  Administered 2016-10-01: 1000 mL via INTRAVENOUS

## 2016-10-01 MED ORDER — LORATADINE 10 MG PO TABS
10.0000 mg | ORAL_TABLET | Freq: Every day | ORAL | Status: DC | PRN
Start: 2016-10-01 — End: 2016-10-02

## 2016-10-01 MED ORDER — IOPAMIDOL (ISOVUE-370) INJECTION 76%
INTRAVENOUS | Status: AC
  Filled 2016-10-01: qty 100

## 2016-10-01 MED ORDER — SODIUM CHLORIDE 0.9% FLUSH
3.0000 mL | Freq: Two times a day (BID) | INTRAVENOUS | Status: DC
  Administered 2016-10-01 – 2016-10-02 (×2): 3 mL via INTRAVENOUS

## 2016-10-01 NOTE — Progress Notes (Signed)
ANTICOAGULATION CONSULT NOTE - Initial Consult  Pharmacy Consult for Eliquis Indication: pulmonary embolus  No Known Allergies  Patient Measurements: Height: 5\' 10"  (177.8 cm) Weight: 175 lb (79.4 kg) IBW/kg (Calculated) : 73  Vital Signs: Temp: 99.6 F (37.6 C) (01/03 1942) Temp Source: Oral (01/03 1942) BP: 146/84 (01/03 1942) Pulse Rate: 110 (01/03 1942)  Labs:  Recent Labs  10/01/16 1421  HGB 14.3  HCT 40.3  PLT 250  CREATININE 0.99    Estimated Creatinine Clearance: 92.2 mL/min (by C-G formula based on SCr of 0.99 mg/dL).   Medical History: Past Medical History:  Diagnosis Date  . HIV infection ([place])   . Kidney stones     Medications:  Infusions:   Assessment: 51yo M with hx HIV presents with cough & rib pain x 3 days.  Chest CT + bilateral PE.   CBC wnl.  No bleeding noted.  Not on any anticoagulants PTA.   Patient was given heparin bolus.  Now requesting patient start on DOAC.   Xarelto has absolute contraindication with Genvoya.  Spoke with Dr Loleta Books and will start patient on Eliquis.   Note Eliquis dosing is 50% of usual treatment dose due to cobicistat drug interaction.   Plan:  Eliquis 5mg  po BID x 7days then 2.5mg  po BID  Monitor CBC & s/sx of bleeding Educate patient on medication  Netta Cedars, PharmD, BCPS Pager: [phone] 10/01/2016,8:00 PM

## 2016-10-01 NOTE — ED Notes (Signed)
Bed: [id number] Expected date:  Expected time:  Means of arrival:  Comments: Triage 7

## 2016-10-01 NOTE — ED Notes (Signed)
Called 4W spoke with Judson Roch, RN. Will return call when ready to receive pt.

## 2016-10-01 NOTE — ED Triage Notes (Signed)
Patient reports productive cough, congestion, and right side rib pain with coughing and movement x3 days. Denies SOB, abdominal pain, N/V/D and fevers. Patient reports taking tylenol with no relief.

## 2016-10-01 NOTE — H&P (Signed)
History and Physical   Travis Wheeler [id number] DOB: March 30, 1966 DOA: 10/01/2016  Referring MD/NP/PA: Charline Bills, PA, EDP PCP: Kandice Hams, MD Outpatient Specialists: Scharlene Gloss, ID  Patient coming from: Home  Chief Complaint: Cough  HPI: Travis Wheeler is a 51 y.o. male with a history of HIV who presented to the ED 01/03 for dry cough. He endorsed 3 days of stable nonproductive cough causing right lower chest pain with deep breathing/coughing. He took antihistamines without relief and presented to the ED for progressive dyspnea on exertion. No fevers or palpitations.   ED Course: Was triaged to fast track with HR 115bpm, afebrile, BP 135/79, 96% on room air. WBC 11, D-dimer elevated at 2.64. CXR suggestive of LLL pneumonia. CT chest showed multiple PE's without focal consolidation. [facility] was called for admission.  Review of Systems: No abd pain, N/V/D, leg swelling and otherwise per HPI. All others reviewed and are negative.   Past Medical History:  Diagnosis Date  . HIV infection ([place])   . Kidney stones     Past Surgical History:  Procedure Laterality Date  . EXAMINATION UNDER ANESTHESIA N/A 03/17/2014   Procedure: EXAM UNDER ANESTHESIA AND EXCISION OF ANAL MASS;  Surgeon: Ralene Ok, MD;  Location: [place];  Service: General;  Laterality: N/A;   - Lives with mother in [place], employed full-time, nonsmoker, drinks EtOH approximately monthly. No illicit substances.  No Known Allergies  - Family history otherwise reviewed and not pertinent. Father had bilateral LE DVTs which led to Dx colon CA in 2011. Prior to Admission medications   Medication Sig Start Date End Date Taking? Authorizing Provider  cetirizine (ZYRTEC) 10 MG tablet Take 10 mg by mouth daily.    Historical Provider, MD  elvitegravir-cobicistat-emtricitabine-tenofovir (GENVOYA) 150-150-200-10 MG TABS tablet Take 1 tablet by mouth daily. 09/25/16   Thayer Headings, MD  PATADAY 0.2 % SOLN INT 1 GTT INTO  [facility] EYE ONCE D IF NEEDED 03/07/16   Historical Provider, MD   Physical Exam: Vitals:   10/01/16 1319 10/01/16 1452 10/01/16 1641  BP: 135/79 116/71 140/84  Pulse: 115 105 106  Resp: 18 18 16   Temp: 98.3 F (36.8 C) 99.1 F (37.3 C)   TempSrc: Oral Oral   SpO2: 96%  99%  Weight: 79.4 kg (175 lb)    Height: 5\' 10"  (1.778 m)     Constitutional: 51 y.o. male in no distress, calm demeanor Eyes: Lids and conjunctivae normal, PERRL ENMT: Mucous membranes are moist. Posterior pharynx clear of any exudate or lesions. Good dentition.  Neck: normal, supple, no masses, no thyromegaly Respiratory: Non-labored breathing without accessory muscle use. Clear breath sounds to auscultation bilaterally Cardiovascular: Tachycardic, regular, no murmurs, rubs, or gallops. No carotid bruits. No JVD. No LE edema. 2+ pedal pulses. Abdomen: Normoactive bowel sounds. No tenderness, non-distended, and no masses palpated. No hepatosplenomegaly. GU: No indwelling catheter Musculoskeletal: No clubbing / cyanosis. No joint deformity upper and lower extremities. Good ROM, no contractures. Normal muscle tone. Negative homan's bilaterally. Skin: Warm, dry. No rashes, wounds, no ulcers. No significant lesions noted.  Neurologic: CN II-XII grossly intact. Gait normal, narrow-based. Speech normal. No focal deficits in motor strength or sensation in all extremities.  Psychiatric: Alert and oriented x3. Normal judgment and insight. Mood euthymic with congruent affect.   Labs on Admission: I have personally reviewed following labs and imaging studies  CBC:  Recent Labs Lab 10/01/16 1421  WBC 11.0*  NEUTROABS 8.6*  HGB 14.3  HCT 40.3  MCV  88.2  PLT [id number]   Basic Metabolic Panel:  Recent Labs Lab 10/01/16 1421  NA 137  K 3.8  CL 104  CO2 26  GLUCOSE 108*  BUN 7  CREATININE 0.99  CALCIUM 9.3   Urine analysis:    Component Value Date/Time   COLORURINE YELLOW 03/23/2013 0235   APPEARANCEUR CLEAR  03/23/2013 0235   LABSPEC 1.022 03/23/2013 0235   PHURINE 5.0 03/23/2013 0235   GLUCOSEU NEGATIVE 03/23/2013 0235   HGBUR TRACE (A) 03/23/2013 0235   BILIRUBINUR NEGATIVE 03/23/2013 0235   KETONESUR NEGATIVE 03/23/2013 0235   PROTEINUR 100 (A) 03/23/2013 0235   UROBILINOGEN 2.0 (H) 03/23/2013 0235   NITRITE NEGATIVE 03/23/2013 0235   LEUKOCYTESUR NEGATIVE 03/23/2013 0235   Radiological Exams on Admission: Dg Chest 2 View  Result Date: 10/01/2016 CLINICAL DATA:  Productive cough, congestion EXAM: CHEST  2 VIEW COMPARISON:  Radiograph 03/23/2013 FINDINGS: On the lateral projection there is lower lobe consolidation with air bronchograms. On frontal projection this appears to localize to the LEFT lower lobe. The is bibasilar atelectasis. Upper lungs are clear. No lymphadenopathy. No aggressive osseous lesion. IMPRESSION: LEFT lower lobe pneumonia. Electronically Signed   By: Suzy Bouchard M.D.   On: 10/01/2016 14:49   Ct Angio Chest Pe W/cm &/or Wo Cm  Result Date: 10/01/2016 CLINICAL DATA:  Right-sided pleuritic chest pain and elevated D-dimer. EXAM: CT ANGIOGRAPHY CHEST WITH CONTRAST TECHNIQUE: Multidetector CT imaging of the chest was performed using the standard protocol during bolus administration of intravenous contrast. Multiplanar CT image reconstructions and MIPs were obtained to evaluate the vascular anatomy. CONTRAST:  75 cc Isovue 370 intravenous COMPARISON:  Chest x-ray from earlier today FINDINGS: Cardiovascular: Satisfactory opacification of the pulmonary artery showing bilateral segmental filling defects consistent with acute pulmonary emboli. These are seen in anterior segment right upper lobe, anterior/lateral basal right lower lobe, and superior segment lingula. Normal RV to LV ratio of 0.85. Normal heart size. No pericardial effusion. Mediastinum/Nodes: No adenopathy or mass. Lungs/Pleura: Bilateral lung opacities are streaky and basilar consistent with atelectasis. There is also  ground-glass opacity in in a wedge-shaped distribution in the right lower lobe where there is occlusive basal segmental clot, likely infarct. High-density material in the lung bases which is likely calcified. No oral contrast provided to suggest acute aspiration. No pulmonary edema or pleural effusion. Upper Abdomen: No acute finding. Musculoskeletal: No acute or aggressive finding Critical Value/emergent results were called by telephone at the time of interpretation on 10/01/2016 at 3:59 pm to Dr. Lonn Georgia ROSE , who verbally acknowledged these results. Review of the MIP images confirms the above findings. IMPRESSION: Acute pulmonary embolism to multiple segmental branches bilaterally. Right lower lobe lung infarct and bibasilar atelectasis/scar. No indication of right heart strain. Electronically Signed   By: Monte Fantasia M.D.   On: 10/01/2016 16:01   EKG: Independently reviewed. Sinus tachycardia without right axis deviation/BBB. No STEMI.  Assessment/Plan Active Problems:   Pulmonary embolus (HCC)   Pulmonary emboli: Unprovoked. No hypoxia or evidence of right heart strain. Symptoms started 3 days PTA.  - Systemic therapeutic heparin started in ED. After discussion of risks and benefits of coumadin and NOACs, will transition to xarelto. - Echocardiogram ordered - Monitor on telemetry overnight  HIV: On genvoya per Dr. Linus Salmons. CD4 has been stable at 300 and viral load last was 93 copies. - Continue genvoya  DVT prophylaxis: Heparin gtt > NOAC  Code Status: Full  Family Communication: None at bedside  Disposition Plan: Admit  to telemetry for observation in beginning anticoagulation. Consults called: None  Admission status: Observation.  Vance Gather, MD [facility] Pager [phone]  If 7PM-7AM, please contact night-coverage [email or web address] Password [facility] 10/01/2016, 4:47 PM

## 2016-10-01 NOTE — ED Provider Notes (Signed)
[place] DEPT Provider Note   CSN: [id number] Arrival date & time: 10/01/16  1309     History   Chief Complaint Chief Complaint  Patient presents with  . Cough    HPI Travis Wheeler is a 51 y.o. male.  HPI Travis Wheeler is a 51 y.o. male with PMH significant for HIV (last CD4 coutn 300 VL 93) who presents with 3 days of right-sided nonradiating sharp chest pain that is worse with deep inspiration and coughing. Associated symptoms include dry, nonproductive cough and shortness of breath. No fever, chills, myalgias, nasal congestion, rhinorrhea, sore throat, nausea, vomiting, or diarrhea. He has been taking Tylenol for his symptoms with the last dose this morning. No sick contacts. He states this feels similar to when he had pneumonia in the past and is concerned. No recent surgery, prolonged immobilization, history of DVT/PE, or unilateral leg edema.  Past Medical History:  Diagnosis Date  . HIV infection ([place])   . Kidney stones     Patient Active Problem List   Diagnosis Date Noted  . Pulmonary embolus ([place]) 10/01/2016  . Screening examination for venereal disease 03/22/2015  . Encounter for long-term (current) use of medications 03/22/2015  . 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 HIV (human immunodeficiency virus infection) ([place]) 03/23/2013    Past Surgical History:  Procedure Laterality Date  . EXAMINATION UNDER ANESTHESIA N/A 03/17/2014   Procedure: EXAM UNDER ANESTHESIA AND EXCISION OF ANAL MASS;  Surgeon: Ralene Ok, MD;  Location: [place];  Service: General;  Laterality: N/A;       Home Medications    Prior to Admission medications   Medication Sig Start Date End Date Taking? Authorizing Provider  acetaminophen (TYLENOL) 500 MG tablet Take 1,000 mg by mouth every 6 (six) hours as needed for moderate pain.   Yes Historical Provider, MD  cetirizine (ZYRTEC) 10 MG tablet Take 10 mg by mouth daily.   Yes Historical Provider, MD    elvitegravir-cobicistat-emtricitabine-tenofovir (GENVOYA) 150-150-200-10 MG TABS tablet Take 1 tablet by mouth daily. 09/25/16  Yes Thayer Headings, MD  PATADAY 0.2 % SOLN Instill 1 drop into both eyes twice daily as needed for allergies 03/07/16  Yes Historical Provider, MD    Family History History reviewed. No pertinent family history.  Social History Social History  Substance Use Topics  . Smoking status: Never Smoker  . Smokeless tobacco: Never Used  . Alcohol use 0.0 oz/week     Comment: Socially     Allergies   Patient has no known allergies.   Review of Systems Review of Systems All other systems negative unless otherwise stated in HPI   Physical Exam Updated Vital Signs BP 140/84   Pulse 106   Temp 99.1 F (37.3 C) (Oral)   Resp 16   Ht 5\' 10"  (1.778 m)   Wt 79.4 kg   SpO2 99%   BMI 25.11 kg/m   Physical Exam  Constitutional: He is oriented to person, place, and time. He appears well-developed and well-nourished. He is active.  Non-toxic appearance. He does not have a sickly appearance. He does not appear ill.  HENT:  Head: Normocephalic and atraumatic.  Right Ear: Tympanic membrane and external ear normal. Tympanic membrane is not erythematous and not bulging.  Left Ear: Tympanic membrane and external ear normal. Tympanic membrane is not erythematous and not bulging.  Nose: Nose normal.  Mouth/Throat: Uvula is midline, oropharynx is clear and moist and mucous membranes are normal. No trismus in the jaw. No  uvula swelling. No oropharyngeal exudate, posterior oropharyngeal edema, posterior oropharyngeal erythema or tonsillar abscesses.  Neck: Normal range of motion. Neck supple.  No nuchal rigidity.   Cardiovascular: Regular rhythm.  Tachycardia present.   Lower extremity symmetric and without edema. Heart rate 118.  Pulmonary/Chest: Effort normal. No respiratory distress. He has decreased breath sounds in the left upper field. He has no wheezes. He has no  rales.  Abdominal: Soft. Bowel sounds are normal. He exhibits no distension. There is no tenderness.  Musculoskeletal: Normal range of motion.  Lymphadenopathy:    He has no cervical adenopathy.  Neurological: He is alert and oriented to person, place, and time.  Skin: Skin is warm and dry.  Psychiatric: He has a normal mood and affect. His behavior is normal.     ED Treatments / Results  Labs (all labs ordered are listed, but only abnormal results are displayed) Labs Reviewed  CBC WITH DIFFERENTIAL/PLATELET - Abnormal; Notable for the following:       Result Value   WBC 11.0 (*)    Neutro Abs 8.6 (*)    All other components within normal limits  BASIC METABOLIC PANEL - Abnormal; Notable for the following:    Glucose, Bld 108 (*)    All other components within normal limits  D-DIMER, QUANTITATIVE (NOT AT [facility]) - Abnormal; Notable for the following:    D-Dimer, Quant 2.63 (*)    All other components within normal limits  I-STAT TROPOININ, ED    EKG  EKG Interpretation  Date/Time:  Wednesday October 01 2016 14:40:26 EST Ventricular Rate:  108 PR Interval:    QRS Duration: 70 QT Interval:  318 QTC Calculation: 427 R Axis:   46 Text Interpretation:  Sinus tachycardia No STEMI.  Confirmed by LONG MD, JOSHUA [phone]) on 10/01/2016 2:49:10 PM       Radiology Dg Chest 2 View  Result Date: 10/01/2016 CLINICAL DATA:  Productive cough, congestion EXAM: CHEST  2 VIEW COMPARISON:  Radiograph 03/23/2013 FINDINGS: On the lateral projection there is lower lobe consolidation with air bronchograms. On frontal projection this appears to localize to the LEFT lower lobe. The is bibasilar atelectasis. Upper lungs are clear. No lymphadenopathy. No aggressive osseous lesion. IMPRESSION: LEFT lower lobe pneumonia. Electronically Signed   By: Suzy Bouchard M.D.   On: 10/01/2016 14:49   Ct Angio Chest Pe W/cm &/or Wo Cm  Result Date: 10/01/2016 CLINICAL DATA:  Right-sided pleuritic chest pain  and elevated D-dimer. EXAM: CT ANGIOGRAPHY CHEST WITH CONTRAST TECHNIQUE: Multidetector CT imaging of the chest was performed using the standard protocol during bolus administration of intravenous contrast. Multiplanar CT image reconstructions and MIPs were obtained to evaluate the vascular anatomy. CONTRAST:  75 cc Isovue 370 intravenous COMPARISON:  Chest x-ray from earlier today FINDINGS: Cardiovascular: Satisfactory opacification of the pulmonary artery showing bilateral segmental filling defects consistent with acute pulmonary emboli. These are seen in anterior segment right upper lobe, anterior/lateral basal right lower lobe, and superior segment lingula. Normal RV to LV ratio of 0.85. Normal heart size. No pericardial effusion. Mediastinum/Nodes: No adenopathy or mass. Lungs/Pleura: Bilateral lung opacities are streaky and basilar consistent with atelectasis. There is also ground-glass opacity in in a wedge-shaped distribution in the right lower lobe where there is occlusive basal segmental clot, likely infarct. High-density material in the lung bases which is likely calcified. No oral contrast provided to suggest acute aspiration. No pulmonary edema or pleural effusion. Upper Abdomen: No acute finding. Musculoskeletal: No acute or aggressive  finding Critical Value/emergent results were called by telephone at the time of interpretation on 10/01/2016 at 3:59 pm to Dr. Lonn Georgia Anna Livers , who verbally acknowledged these results. Review of the MIP images confirms the above findings. IMPRESSION: Acute pulmonary embolism to multiple segmental branches bilaterally. Right lower lobe lung infarct and bibasilar atelectasis/scar. No indication of right heart strain. Electronically Signed   By: Monte Fantasia M.D.   On: 10/01/2016 16:01    Procedures Procedures (including critical care time)  Medications Ordered in ED Medications  iopamidol (ISOVUE-370) 76 % injection (not administered)  heparin bolus via infusion  5,000 Units (not administered)  heparin ADULT infusion 100 units/mL (25000 units/276mL sodium chloride 0.45%) (not administered)  morphine 4 MG/ML injection 4 mg (not administered)  sodium chloride 0.9 % bolus 1,000 mL (1,000 mLs Intravenous New Bag/Given 10/01/16 1512)  iopamidol (ISOVUE-370) 76 % injection 100 mL (75 mLs Intravenous Contrast Given 10/01/16 1545)     Initial Impression / Assessment and Plan / ED Course  I have reviewed the triage vital signs and the nursing notes.  Pertinent labs & imaging results that were available during my care of the patient were reviewed by me and considered in my medical decision making (see chart for details).  Clinical Course as of Oct 01 1698  Wed Oct 01, 2016  1601 CT Angio Chest PE W/Cm &/Or Wo Cm [SP]    Clinical Course User Index [SP] Sonum Patel   Patient presents with right sided chest pain, shortness of breath, nonproductive cough. No fevers, chills.  On arrival, he was tachycardic with a heart rate of 115. Normotensive and without hypoxia. On exam, patient appears well, nontoxic or ill. Lungs slightly decreased on the left. Chest pain was not reproducible. Low suspicion for PNA.  Concern for pulmonary embolus. Chest x-ray read as left lower pneumonia. However given patient's right-sided chest pain and elevated d-dimer, CTA chest was obtained and remarkable for acute pulmonary embolism to multiple segmental branches bilaterally and right lower lobe infarct without evidence of right heart strain. Patient started on heparin and admitted for further workup and management. Appreciate TRH.  Case has been discussed with and seen by Dr. Venora Maples who agrees with the above plan for admission.  Final Clinical Impressions(s) / ED Diagnoses   Final diagnoses:  Other acute pulmonary embolism without acute cor pulmonale [facility])    New Prescriptions New Prescriptions   No medications on file     Gloriann Loan, PA-C 10/01/16 [place],  MD 10/02/16 0120

## 2016-10-01 NOTE — Progress Notes (Signed)
ANTICOAGULATION CONSULT NOTE - Initial Consult  Pharmacy Consult for Heparin Indication: pulmonary embolus  No Known Allergies  Patient Measurements: Height: 5\' 10"  (177.8 cm) Weight: 175 lb (79.4 kg) IBW/kg (Calculated) : 73  Vital Signs: Temp: 99.1 F (37.3 C) (01/03 1452) Temp Source: Oral (01/03 1452) BP: 116/71 (01/03 1452) Pulse Rate: 105 (01/03 1452)  Labs:  Recent Labs  10/01/16 1421  HGB 14.3  HCT 40.3  PLT 250  CREATININE 0.99    Estimated Creatinine Clearance: 92.2 mL/min (by C-G formula based on SCr of 0.99 mg/dL).   Medical History: Past Medical History:  Diagnosis Date  . HIV infection ([place])   . Kidney stones     Medications:  Infusions:   Assessment: 51yo M with hx HIV presents with cough & rib pain x 3 days.  Chest CT + bilateral PE.   CBC wnl.  No bleeding noted.  Not on any anticoagulants PTA.   Goal of Therapy:  Heparin level 0.3-0.7 Monitor platelets by anticoagulation protocol: Yes   Plan:  Give 5000 units bolus x 1 Start heparin infusion at 1400 units/hr Check anti-Xa level in 6 hours and daily while on heparin Continue to monitor H&H and platelets  Netta Cedars, PharmD, BCPS Pager: [phone] 10/01/2016,4:21 PM

## 2016-10-02 ENCOUNTER — Observation Stay (HOSPITAL_BASED_OUTPATIENT_CLINIC_OR_DEPARTMENT_OTHER): Payer: BLUE CROSS/BLUE SHIELD

## 2016-10-02 DIAGNOSIS — I2699 Other pulmonary embolism without acute cor pulmonale: Secondary | ICD-10-CM

## 2016-10-02 DIAGNOSIS — B20 Human immunodeficiency virus [HIV] disease: Secondary | ICD-10-CM | POA: Diagnosis not present

## 2016-10-02 LAB — ECHOCARDIOGRAM COMPLETE
HEIGHTINCHES: 70 in
WEIGHTICAEL: 2800 [oz_av]

## 2016-10-02 MED ORDER — APIXABAN 2.5 MG PO TABS: ORAL_TABLET | ORAL | 1 refills | Status: DC

## 2016-10-02 MED ORDER — OXYCODONE HCL 5 MG PO TABS: 5.0000 mg | ORAL_TABLET | Freq: Four times a day (QID) | ORAL | 0 refills | Status: DC | PRN

## 2016-10-02 NOTE — Care Management Note (Signed)
Case Management Note  Patient Details  Name: Travis Wheeler MRN: [id number] Date of Birth: Feb 13, 1966  Subjective/Objective:   51 y/o m admitted w/PE. From home.                 Action/Plan:d/c plan home.   Expected Discharge Date:   (unknown)               Expected Discharge Plan:  Home/Self Care  In-House Referral:     Discharge planning Services  CM Consult  Post Acute Care Choice:    Choice offered to:     DME Arranged:    DME Agency:     HH Arranged:    HH Agency:     Status of Service:  In process, will continue to follow  If discussed at Long Length of Stay Meetings, dates discussed:    Additional Comments:  Dessa Phi, RN 10/02/2016, 11:51 AM

## 2016-10-02 NOTE — Care Management Note (Signed)
Case Management Note  Patient Details  Name: Travis Wheeler MRN: [id number] Date of Birth: 06-18-1966  Subjective/Objective:                    Action/Plan:d/c home.   Expected Discharge Date:   (unknown)               Expected Discharge Plan:  Home/Self Care  In-House Referral:     Discharge planning Services  CM Consult  Post Acute Care Choice:    Choice offered to:     DME Arranged:    DME Agency:     HH Arranged:    [place] Agency:     Status of Service:  Completed, signed off  If discussed at [employer] of Stay Meetings, dates discussed:    Additional Comments:  Dessa Phi, RN 10/02/2016, 3:29 PM

## 2016-10-02 NOTE — Progress Notes (Signed)
  Echocardiogram 2D Echocardiogram has been performed.  Travis Wheeler 10/02/2016, 2:26 PM

## 2016-10-02 NOTE — Discharge Instructions (Signed)
Information on my medicine - ELIQUIS (apixaban)  This medication education was reviewed with me or my healthcare representative as part of my discharge preparation.  The pharmacist that spoke with me during my hospital stay was:  Rhona Leavens.  Why was Eliquis prescribed for you? Eliquis was prescribed to treat blood clots that may have been found in the veins of your legs (deep vein thrombosis) or in your lungs (pulmonary embolism) and to reduce the risk of them occurring again.  What do You need to know about Eliquis ? The starting dose is 5 mg (two 2.5 mg tablets) taken TWICE daily for the FIRST SEVEN (7) DAYS, then on 10/09/2016  the dose is reduced to ONE 2.5 mg tablet taken TWICE daily.  Eliquis may be taken with or without food.   Try to take the dose about the same time in the morning and in the evening. If you have difficulty swallowing the tablet whole please discuss with your pharmacist how to take the medication safely.  Take Eliquis exactly as prescribed and DO NOT stop taking Eliquis without talking to the doctor who prescribed the medication.  Stopping may increase your risk of developing a new blood clot.  Refill your prescription before you run out.  After discharge, you should have regular check-up appointments with your healthcare provider that is prescribing your Eliquis.    What do you do if you miss a dose? If a dose of ELIQUIS is not taken at the scheduled time, take it as soon as possible on the same day and twice-daily administration should be resumed. The dose should not be doubled to make up for a missed dose.  Important Safety Information A possible side effect of Eliquis is bleeding. You should call your healthcare provider right away if you experience any of the following: ? Bleeding from an injury or your nose that does not stop. ? Unusual colored urine (red or dark brown) or unusual colored stools (red or black). ? Unusual bruising for unknown  reasons. ? A serious fall or if you hit your head (even if there is no bleeding).  Some medicines may interact with Eliquis and might increase your risk of bleeding or clotting while on Eliquis. To help avoid this, consult your healthcare provider or pharmacist prior to using any new prescription or non-prescription medications, including herbals, vitamins, non-steroidal anti-inflammatory drugs (NSAIDs) and supplements.  This website has more information on Eliquis (apixaban): http://www.eliquis.com/eliquis/home

## 2016-10-02 NOTE — Discharge Summary (Signed)
Physician Discharge Summary  Travis Wheeler [id number] DOB: 1966-07-30 DOA: 10/01/2016  PCP: Kandice Hams, MD  Admit date: 10/01/2016 Discharge date: 10/02/2016  Admitted From: Home Disposition: Home   Recommendations for Outpatient Follow-up:  1. Follow up with PCP in 1-2 weeks.  2. Discharged on eliquis 5mg  BID x 7 days, then 2.5mg  BID for 3-6 months to treat PE 3. Please obtain BMP to monitor renal function  Home Health: None Equipment/Devices: None Discharge Condition: Stable CODE STATUS: Full Diet recommendation: Regular  Brief/Interim Summary: Travis Wheeler is a 51 y.o. male with a history of HIV who presented to the ED 01/03 for dry cough. He endorsed 3 days of stable nonproductive cough causing right lower chest pain with deep breathing/coughing without fever or palpitations. He took antihistamines without relief and presented to the ED for progressive dyspnea on exertion. On arrival he was in no distress, triaged to fast track with HR 115bpm, afebrile, BP 135/79, 96% on room air. WBC 11, D-dimer elevated at 2.64. CXR suggestive of LLL pneumonia. CT chest showed multiple PE's without focal consolidation and no evidence of right heart strain. Heparin bolus was given and eliquis was started for anticoagulation. Pleuritic chest pain has been managed with oral pain medications. He continues to have some dyspnea on exertion which is stable/improving and not associated with significant hypoxemia.    Discharge Diagnoses:  Principal Problem:   Pulmonary embolus ([place]) Active Problems:   HIV (human immunodeficiency virus infection) ([place])  Pulmonary emboli: Unprovoked. No hypoxia or evidence of right heart strain. Symptoms started 3 days PTA.  - Systemic therapeutic heparin started in ED. After discussion of risks and benefits of coumadin and NOACs, we started and will continue eliquis on discharge. - Echocardiogram without significant right heart strain as below.   HIV: On genvoya  per Dr. Linus Salmons. CD4 has been stable at 300 and viral load last was 93 copies. - Continue genvoya  Discharge Instructions Discharge Instructions    Discharge instructions    Complete by:  As directed    You were admitted with pulmonary embolism - clots in the lungs. The treatment for this is anticoagulation (blood thinner) that you will take as follows:  - ELIQUIS 2.5mg  tablet - you'll take 5mg  (two pills) twice daily for 14 days, then take 2.5mg  (1 pill) twice daily thereafter.  - It is very important that you continue to have follow up with your primary care physician. - You are expected to have some chest pain that will slowly improve. If this gets worse or you develop worsening trouble breathing please seek medical care.   Increase activity slowly    Complete by:  As directed      Allergies as of 10/02/2016   No Known Allergies     Medication List    TAKE these medications   acetaminophen 500 MG tablet Commonly known as:  TYLENOL Take 1,000 mg by mouth every 6 (six) hours as needed for moderate pain.   apixaban 2.5 MG Tabs tablet Commonly known as:  ELIQUIS take 2 tabs by mouth twice daily for 14 days then take 1 tab by mouth twice daily   cetirizine 10 MG tablet Commonly known as:  ZYRTEC Take 10 mg by mouth daily.   elvitegravir-cobicistat-emtricitabine-tenofovir 150-150-200-10 MG Tabs tablet Commonly known as:  GENVOYA Take 1 tablet by mouth daily.   oxyCODONE 5 MG immediate release tablet Commonly known as:  Oxy IR/ROXICODONE Take 1 tablet (5 mg total) by mouth every 6 (six) hours as  needed for severe pain.   PATADAY 0.2 % Soln Generic drug:  Olopatadine HCl Instill 1 drop into both eyes twice daily as needed for allergies      Follow-up Information    Kandice Hams, MD.   Specialty:  Internal Medicine Contact information: [address] [place] [address] [phone]          No Known  Allergies  Consultations:  None  Procedures/Studies: Dg Chest 2 View  Result Date: 10/01/2016 CLINICAL DATA:  Productive cough, congestion EXAM: CHEST  2 VIEW COMPARISON:  Radiograph 03/23/2013 FINDINGS: On the lateral projection there is lower lobe consolidation with air bronchograms. On frontal projection this appears to localize to the LEFT lower lobe. The is bibasilar atelectasis. Upper lungs are clear. No lymphadenopathy. No aggressive osseous lesion. IMPRESSION: LEFT lower lobe pneumonia. Electronically Signed   By: Suzy Bouchard M.D.   On: 10/01/2016 14:49   Ct Angio Chest Pe W/cm &/or Wo Cm  Result Date: 10/01/2016 CLINICAL DATA:  Right-sided pleuritic chest pain and elevated D-dimer. EXAM: CT ANGIOGRAPHY CHEST WITH CONTRAST TECHNIQUE: Multidetector CT imaging of the chest was performed using the standard protocol during bolus administration of intravenous contrast. Multiplanar CT image reconstructions and MIPs were obtained to evaluate the vascular anatomy. CONTRAST:  75 cc Isovue 370 intravenous COMPARISON:  Chest x-ray from earlier today FINDINGS: Cardiovascular: Satisfactory opacification of the pulmonary artery showing bilateral segmental filling defects consistent with acute pulmonary emboli. These are seen in anterior segment right upper lobe, anterior/lateral basal right lower lobe, and superior segment lingula. Normal RV to LV ratio of 0.85. Normal heart size. No pericardial effusion. Mediastinum/Nodes: No adenopathy or mass. Lungs/Pleura: Bilateral lung opacities are streaky and basilar consistent with atelectasis. There is also ground-glass opacity in in a wedge-shaped distribution in the right lower lobe where there is occlusive basal segmental clot, likely infarct. High-density material in the lung bases which is likely calcified. No oral contrast provided to suggest acute aspiration. No pulmonary edema or pleural effusion. Upper Abdomen: No acute finding. Musculoskeletal: No acute  or aggressive finding Critical Value/emergent results were called by telephone at the time of interpretation on 10/01/2016 at 3:59 pm to Dr. Lonn Georgia ROSE , who verbally acknowledged these results. Review of the MIP images confirms the above findings. IMPRESSION: Acute pulmonary embolism to multiple segmental branches bilaterally. Right lower lobe lung infarct and bibasilar atelectasis/scar. No indication of right heart strain. Electronically Signed   By: Monte Fantasia M.D.   On: 10/01/2016 16:01    Echocardiogram 10/02/2016: Study Conclusions  - Left ventricle: The cavity size was normal. Wall thickness was   normal. Systolic function was normal. The estimated ejection   fraction was in the range of 60% to 65%. Wall motion was normal;   there were no regional wall motion abnormalities. Doppler   parameters are consistent with abnormal left ventricular   relaxation (grade 1 diastolic dysfunction). The E/e&' ratio is   between 8-15, suggesting indeterminate LV filling pressure. - Mitral valve: Mildly thickened leaflets . There was trivial   regurgitation. - Left atrium: The atrium was normal in size. - Right atrium: The atrium was normal in size. - Tricuspid valve: There was trivial regurgitation. - Pulmonary arteries: PA peak pressure: 53 mm Hg (S). - Inferior vena cava: The vessel was normal in size. The   respirophasic diameter changes were in the normal range (>= 50%),   consistent with normal central venous pressure.  Impressions:  - LVEF 60-65%,  normal wall thickness, normal wall motion, diastolic   dysfunction, indeterminate LV filling presure, trivial MR, normal   biatrial size, trivial TR, RVSP 53 mmHg, normal IVC. Findings   suggest moderate pulmonary hypertension, but no signficant RV   strain.  Subjective: Pt with stable/slightly improved dyspnea on walking in the halls. No fevers, cough improving. Right pleuritic chest pain resolved, now experiencing similar pain on the  left upper chest requiring opioid analgesics overnight.  Discharge Exam: Vitals:   10/01/16 2103 10/02/16 0655  BP: (!) 148/77 139/75  Pulse: (!) 111 (!) 110  Resp: 18 18  Temp: 98.9 F (37.2 C) 98.7 F (37.1 C)   Vitals:   10/01/16 1641 10/01/16 1942 10/01/16 2103 10/02/16 0655  BP: 140/84 146/84 (!) 148/77 139/75  Pulse: 106 110 (!) 111 (!) 110  Resp: 16 18 18 18   Temp:  99.6 F (37.6 C) 98.9 F (37.2 C) 98.7 F (37.1 C)  TempSrc:  Oral Oral Oral  SpO2: 99% 97% 100% 94%  Weight:      Height:       General: Pt is alert, awake, not in acute distress Cardiovascular: Tachycardic rate, S1/S2 +, no rubs, no gallops Respiratory: Nonlabored breathing room air, clear Abdominal: Soft, NT, ND, bowel sounds + Extremities: no edema, no cyanosis  The results of significant diagnostics from this hospitalization (including imaging, microbiology, ancillary and laboratory) are listed below for reference.    Microbiology: No results found for this or any previous visit (from the past 240 hour(s)).   Labs: BNP (last 3 results) No results for input(s): BNP in the last 8760 hours. Basic Metabolic Panel:  Recent Labs Lab 10/01/16 1421  NA 137  K 3.8  CL 104  CO2 26  GLUCOSE 108*  BUN 7  CREATININE 0.99  CALCIUM 9.3   Liver Function Tests: No results for input(s): AST, ALT, ALKPHOS, BILITOT, PROT, ALBUMIN in the last 168 hours. No results for input(s): LIPASE, AMYLASE in the last 168 hours. No results for input(s): AMMONIA in the last 168 hours. CBC:  Recent Labs Lab 10/01/16 1421  WBC 11.0*  NEUTROABS 8.6*  HGB 14.3  HCT 40.3  MCV 88.2  PLT 250   Cardiac Enzymes: No results for input(s): CKTOTAL, CKMB, CKMBINDEX, TROPONINI in the last 168 hours. BNP: Invalid input(s): POCBNP CBG: No results for input(s): GLUCAP in the last 168 hours. D-Dimer  Recent Labs  10/01/16 1421  DDIMER 2.63*   Hgb A1c No results for input(s): HGBA1C in the last 72 hours. Lipid  Profile No results for input(s): CHOL, HDL, LDLCALC, TRIG, CHOLHDL, LDLDIRECT in the last 72 hours. Thyroid function studies No results for input(s): TSH, T4TOTAL, T3FREE, THYROIDAB in the last 72 hours.  Invalid input(s): FREET3 Anemia work up No results for input(s): VITAMINB12, FOLATE, FERRITIN, TIBC, IRON, RETICCTPCT in the last 72 hours. Urinalysis    Component Value Date/Time   COLORURINE YELLOW 03/23/2013 0235   APPEARANCEUR CLEAR 03/23/2013 0235   LABSPEC 1.022 03/23/2013 0235   PHURINE 5.0 03/23/2013 0235   GLUCOSEU NEGATIVE 03/23/2013 0235   HGBUR TRACE (A) 03/23/2013 0235   BILIRUBINUR NEGATIVE 03/23/2013 0235   KETONESUR NEGATIVE 03/23/2013 0235   PROTEINUR 100 (A) 03/23/2013 0235   UROBILINOGEN 2.0 (H) 03/23/2013 0235   NITRITE NEGATIVE 03/23/2013 0235   LEUKOCYTESUR NEGATIVE 03/23/2013 0235   Sepsis Labs Invalid input(s): PROCALCITONIN,  WBC,  LACTICIDVEN Microbiology No results found for this or any previous visit (from the past 240 hour(s)).  Time coordinating  discharge: Over 30 minutes  Vance Gather, MD  [facility] 10/02/2016, 3:41 PM Pager [phone]

## 2016-10-06 ENCOUNTER — Other Ambulatory Visit: Payer: Self-pay | Admitting: *Deleted

## 2016-10-06 DIAGNOSIS — B20 Human immunodeficiency virus [HIV] disease: Secondary | ICD-10-CM

## 2016-10-06 MED ORDER — ELVITEG-COBIC-EMTRICIT-TENOFAF 150-150-200-10 MG PO TABS: 1.0000 | ORAL_TABLET | Freq: Every day | ORAL | 3 refills | Status: DC

## 2016-10-10 ENCOUNTER — Other Ambulatory Visit: Payer: Self-pay | Admitting: Internal Medicine

## 2016-10-10 ENCOUNTER — Other Ambulatory Visit: Payer: Self-pay | Admitting: Family Medicine

## 2016-10-10 DIAGNOSIS — M79605 Pain in left leg: Principal | ICD-10-CM

## 2016-10-10 DIAGNOSIS — M79604 Pain in right leg: Secondary | ICD-10-CM

## 2016-10-10 DIAGNOSIS — I2699 Other pulmonary embolism without acute cor pulmonale: Secondary | ICD-10-CM | POA: Diagnosis not present

## 2016-10-15 ENCOUNTER — Other Ambulatory Visit: Payer: Managed Care, Other (non HMO)

## 2016-10-16 ENCOUNTER — Other Ambulatory Visit: Payer: Managed Care, Other (non HMO)

## 2016-10-29 DIAGNOSIS — M25511 Pain in right shoulder: Secondary | ICD-10-CM | POA: Diagnosis not present

## 2016-10-31 ENCOUNTER — Ambulatory Visit
Admission: RE | Admit: 2016-10-31 | Discharge: 2016-10-31 | Disposition: A | Discharge status: Home / Self Care | Payer: BLUE CROSS/BLUE SHIELD | Source: Ambulatory Visit | Attending: Internal Medicine | Admitting: Internal Medicine

## 2016-10-31 DIAGNOSIS — M79604 Pain in right leg: Secondary | ICD-10-CM

## 2016-10-31 DIAGNOSIS — M79661 Pain in right lower leg: Secondary | ICD-10-CM | POA: Diagnosis not present

## 2016-10-31 DIAGNOSIS — M79662 Pain in left lower leg: Secondary | ICD-10-CM | POA: Diagnosis not present

## 2016-10-31 DIAGNOSIS — M79605 Pain in left leg: Principal | ICD-10-CM

## 2017-02-28 IMAGING — CT CT ANGIO CHEST
3 of 7 series · 18 of 36 positions shown · IV contrast (ISOVUE 370)
Comparison: Chest x-ray from earlier today

CLINICAL DATA: Right-sided pleuritic chest pain and elevated
D-dimer.

EXAM:
CT ANGIOGRAPHY CHEST WITH CONTRAST
TECHNIQUE: Multidetector CT imaging of the chest was performed using the
standard protocol during bolus administration of intravenous
contrast. Multiplanar CT image reconstructions and MIPs were
obtained to evaluate the vascular anatomy.
CONTRAST:  75 cc Isovue 370 intravenous

[Series 5: coronal mpr · coronal · 0.54mm/px · 1 of 110 slices shown]
[im 55/110  mediastinal]
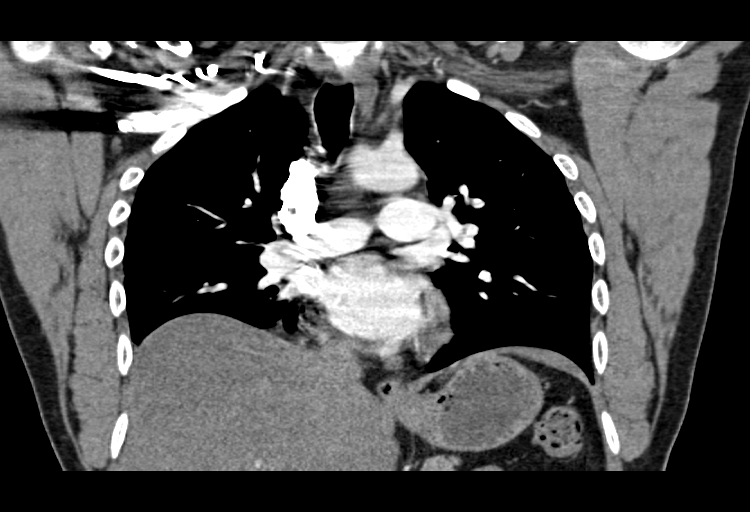

[Series 10: thins for pacs · axial · 0.71mm/px · z∈[-30,+174]mm · 15 of 234 slices shown]
[im 15/234  lung]
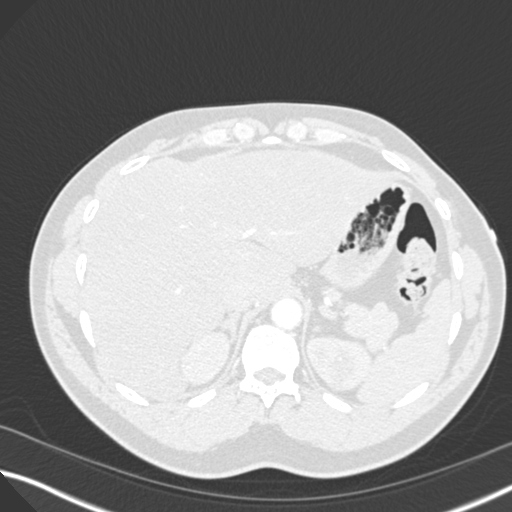
[im 30/234  mediastinal]
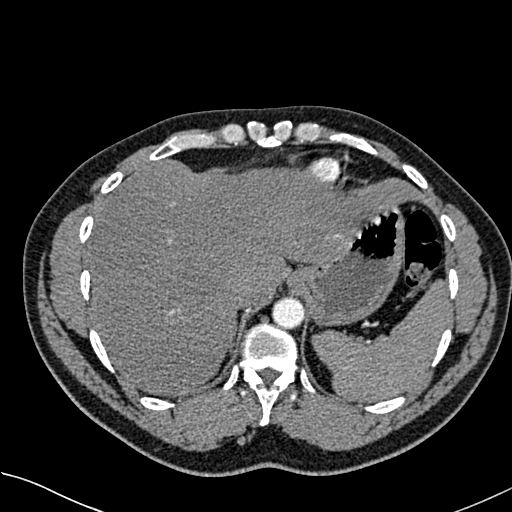
[im 44/234  lung]
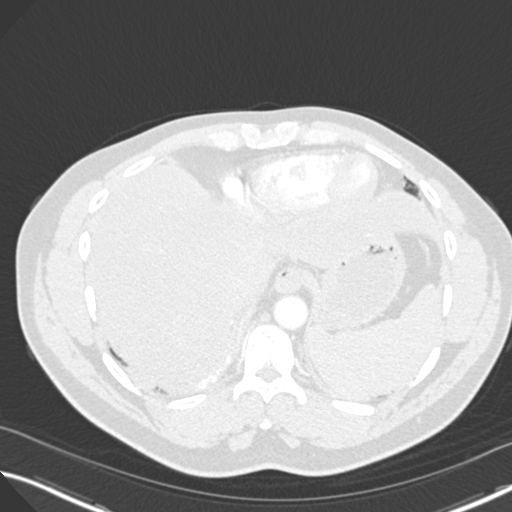
[im 59/234  mediastinal]
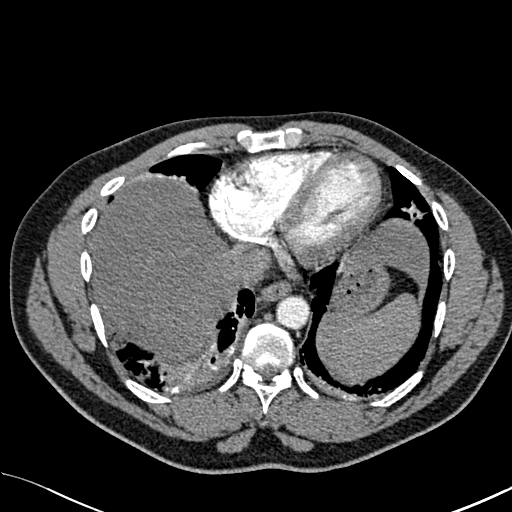
[im 73/234  lung]
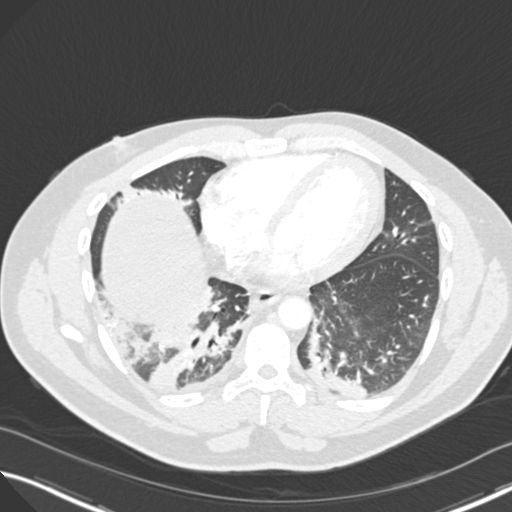
[im 88/234  mediastinal]
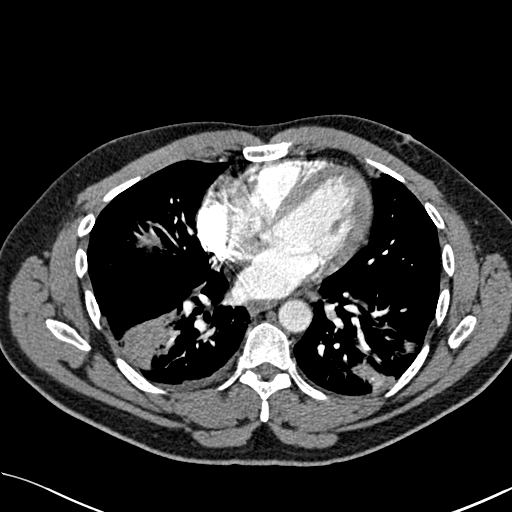
[im 102/234  lung]
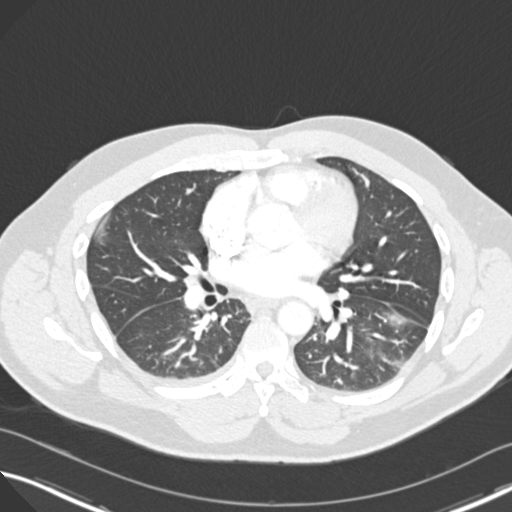
[im 117/234  mediastinal]
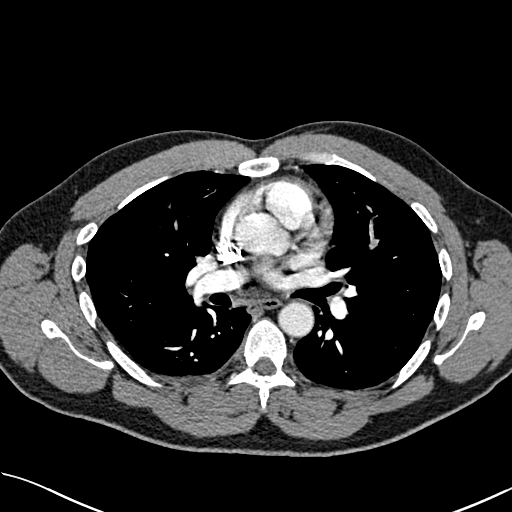
[im 132/234  lung]
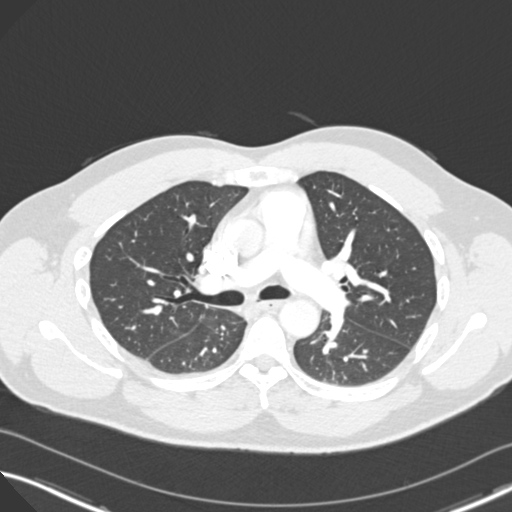
[im 146/234  mediastinal]
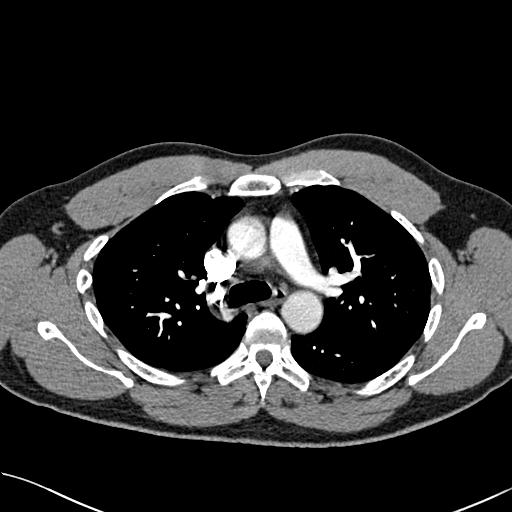
[im 161/234  lung]
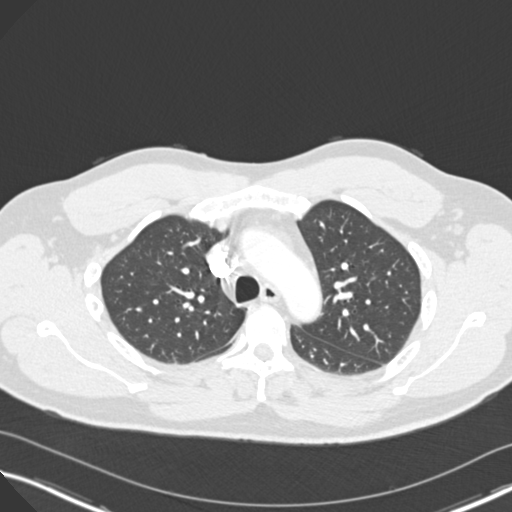
[im 175/234  mediastinal]
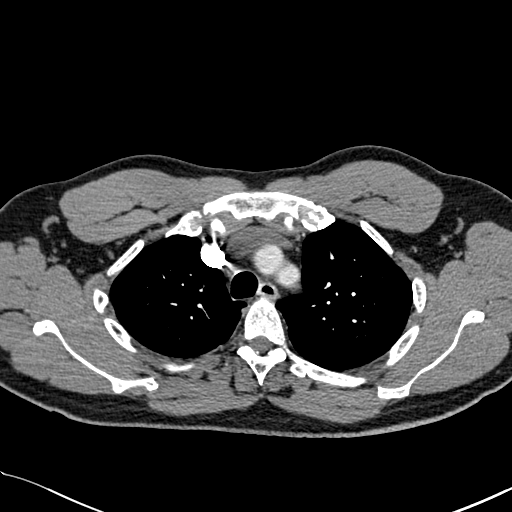
[im 190/234  lung]
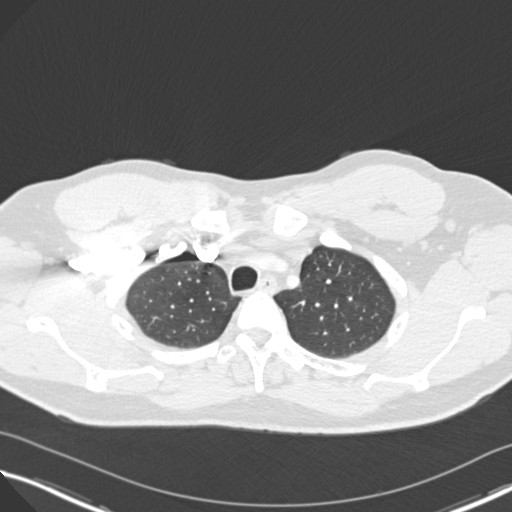
[im 204/234  mediastinal]
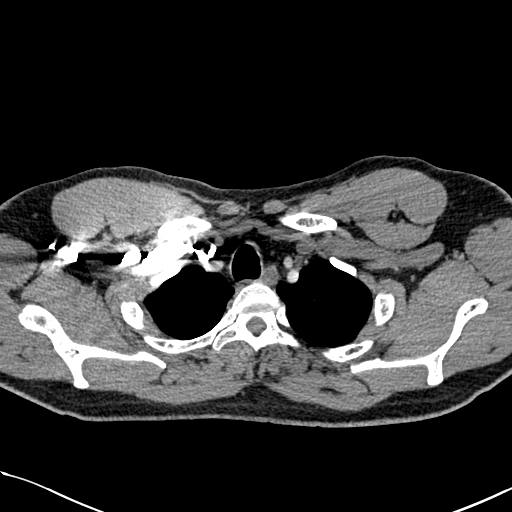
[im 219/234  lung]
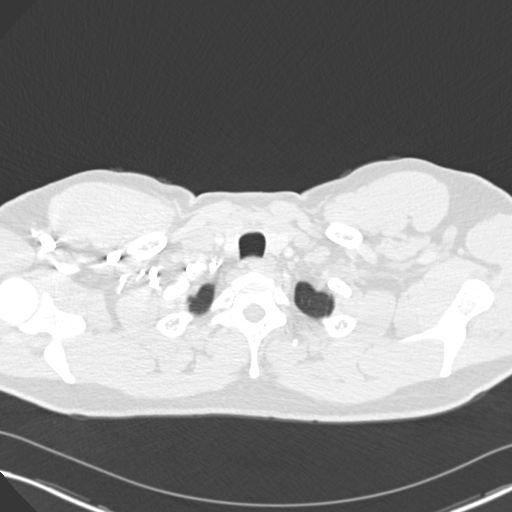

[Series 11: lung windows · axial · 0.71mm/px · z∈[+20,+74]mm · 2 of 74 slices shown]
[im 19/74  mediastinal]
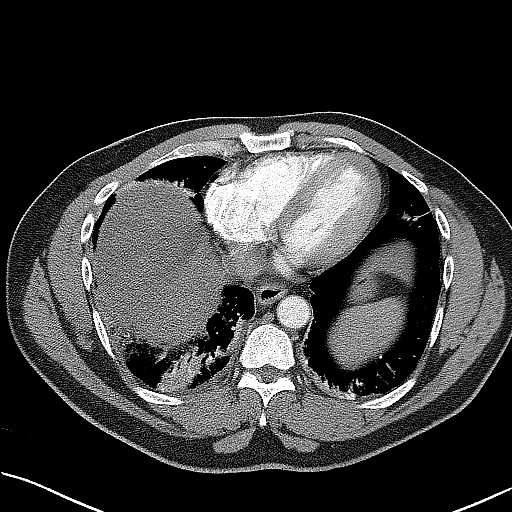
[im 37/74  mediastinal]
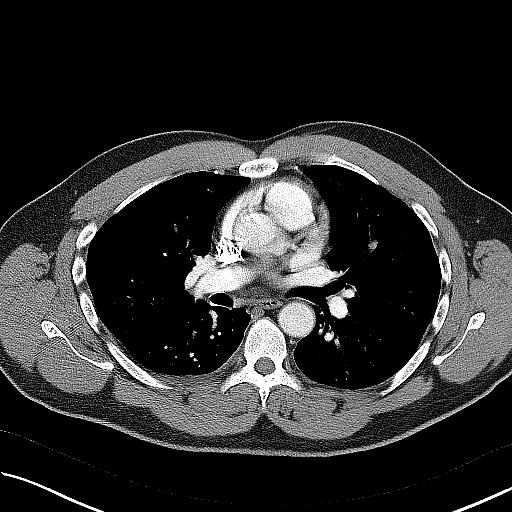

[18 of 36 positions shown; findings below may reference images not displayed]

FINDINGS: Cardiovascular: Satisfactory opacification of the pulmonary artery
showing bilateral segmental filling defects consistent with acute
pulmonary emboli. These are seen in anterior segment right upper
lobe, anterior/lateral basal right lower lobe, and superior segment
lingula. Normal RV to LV ratio of 0.85. Normal heart size. No
pericardial effusion.

Mediastinum/Nodes: No adenopathy or mass.

Lungs/Pleura: Bilateral lung opacities are streaky and basilar
consistent with atelectasis. There is also ground-glass opacity in
in a wedge-shaped distribution in the right lower lobe where there
is occlusive basal segmental clot, likely infarct. High-density
material in the lung bases which is likely calcified. No oral
contrast provided to suggest acute aspiration. No pulmonary edema or
pleural effusion.

Upper Abdomen: No acute finding.

Musculoskeletal: No acute or aggressive finding

Critical Value/emergent results were called by telephone at the time
of interpretation on 10/01/2016 at [DATE] to Dr. DAYANARA BRIAN , who
verbally acknowledged these results.

Review of the MIP images confirms the above findings.
IMPRESSION: Acute pulmonary embolism to multiple segmental branches bilaterally.
Right lower lobe lung infarct and bibasilar atelectasis/scar. No
indication of right heart strain.

## 2017-03-05 ENCOUNTER — Other Ambulatory Visit: Payer: BLUE CROSS/BLUE SHIELD

## 2017-03-05 ENCOUNTER — Other Ambulatory Visit (HOSPITAL_COMMUNITY)
Admission: RE | Admit: 2017-03-05 | Discharge: 2017-03-05 | Disposition: A | Discharge status: Home / Self Care | Payer: BLUE CROSS/BLUE SHIELD | Source: Ambulatory Visit | Attending: Internal Medicine | Admitting: Internal Medicine

## 2017-03-05 DIAGNOSIS — B20 Human immunodeficiency virus [HIV] disease: Secondary | ICD-10-CM | POA: Diagnosis not present

## 2017-03-05 DIAGNOSIS — Z113 Encounter for screening for infections with a predominantly sexual mode of transmission: Secondary | ICD-10-CM | POA: Insufficient documentation

## 2017-03-05 LAB — COMPLETE METABOLIC PANEL WITH GFR
ALBUMIN: 4.2 g/dL (ref 3.6–5.1)
ALK PHOS: 97 U/L (ref 40–115)
ALT: 45 U/L (ref 9–46)
AST: 26 U/L (ref 10–35)
BUN: 10 mg/dL (ref 7–25)
CALCIUM: 9.4 mg/dL (ref 8.6–10.3)
CHLORIDE: 103 mmol/L (ref 98–110)
CO2: 25 mmol/L (ref 20–31)
CREATININE: 1.16 mg/dL (ref 0.70–1.33)
GFR, EST AFRICAN AMERICAN: 84 mL/min (ref >=60)
GFR, Est Non African American: 73 mL/min (ref >=60)
Glucose, Bld: 124 mg/dL — ABNORMAL HIGH (ref 65–99)
POTASSIUM: 3.7 mmol/L (ref 3.5–5.3)
Sodium: 139 mmol/L (ref 135–146)
Total Bilirubin: 0.5 mg/dL (ref 0.2–1.2)
Total Protein: 7.3 g/dL (ref 6.1–8.1)

## 2017-03-05 LAB — CBC WITH DIFFERENTIAL/PLATELET
BASOS ABS: 40 {cells}/uL (ref 0–200)
Basophils Relative: 1 %
EOS ABS: 80 {cells}/uL (ref 15–500)
Eosinophils Relative: 2 %
HCT: 42 % (ref 38.5–50.0)
HEMOGLOBIN: 14.2 g/dL (ref 13.2–17.1)
Lymphocytes Relative: 38 %
Lymphs Abs: 1520 cells/uL (ref 850–3900)
MCH: 31.3 pg (ref 27.0–33.0)
MCHC: 33.8 g/dL (ref 32.0–36.0)
MCV: 92.5 fL (ref 80.0–100.0)
MONOS PCT: 6 %
MPV: 9.9 fL (ref 7.5–12.5)
Monocytes Absolute: 240 cells/uL (ref 200–950)
Neutro Abs: 2120 cells/uL (ref 1500–7800)
Neutrophils Relative %: 53 %
PLATELETS: 241 10*3/uL (ref 140–400)
RBC: 4.54 MIL/uL (ref 4.20–5.80)
RDW: 13.8 % (ref 11.0–15.0)
WBC: 4 10*3/uL (ref 3.8–10.8)

## 2017-03-06 LAB — URINE CYTOLOGY ANCILLARY ONLY
Chlamydia: NEGATIVE
Neisseria Gonorrhea: NEGATIVE

## 2017-03-06 LAB — T-HELPER CELL (CD4) - (RCID CLINIC ONLY)
CD4 T CELL HELPER: 25 % — AB (ref 33–55)
CD4 T Cell Abs: 390 /uL — ABNORMAL LOW (ref 400–2700)

## 2017-03-06 LAB — RPR

## 2017-03-10 LAB — HIV-1 RNA QUANT-NO REFLEX-BLD
HIV 1 RNA QUANT: NOT DETECTED {copies}/mL
HIV-1 RNA Quant, Log: <1.3 Log copies/mL

## 2017-03-19 ENCOUNTER — Ambulatory Visit (INDEPENDENT_AMBULATORY_CARE_PROVIDER_SITE_OTHER): Payer: BLUE CROSS/BLUE SHIELD | Admitting: Internal Medicine

## 2017-03-19 ENCOUNTER — Other Ambulatory Visit: Payer: Self-pay | Admitting: Pharmacist

## 2017-03-19 ENCOUNTER — Encounter: Payer: Self-pay | Admitting: Internal Medicine

## 2017-03-19 VITALS — Ht 71.0 in | Wt 193.0 lb

## 2017-03-19 DIAGNOSIS — I2699 Other pulmonary embolism without acute cor pulmonale: Secondary | ICD-10-CM

## 2017-03-19 DIAGNOSIS — B20 Human immunodeficiency virus [HIV] disease: Secondary | ICD-10-CM

## 2017-03-19 DIAGNOSIS — Z21 Asymptomatic human immunodeficiency virus [HIV] infection status: Secondary | ICD-10-CM

## 2017-03-19 NOTE — Assessment & Plan Note (Addendum)
In review of the interactions, there is an interaction with cobi. I will check with pharmacy if this should be changed and use Biktarvy.

## 2017-03-19 NOTE — Assessment & Plan Note (Signed)
On Eliquis and will check for interactions.

## 2017-03-19 NOTE — Progress Notes (Signed)
   Subjective:    Patient ID: Travis Wheeler, male    DOB: 1966/05/05, 51 y.o.   MRN: [id number]  HPI Here for follow up of HIV Since last visit he had a pulmonary embolus and is on Eliquis. Unprovoked.   He has continued on Genvoya with no missed doses.  CD4 is 390, viral load < 20.  No associated n/v/d.  No weight loss.  No current sob.    Review of Systems  Constitutional: Negative for fatigue.  Gastrointestinal: Negative for diarrhea.  Skin: Negative for rash.       Objective:   Physical Exam  Constitutional: He appears well-developed and well-nourished. No distress.  Eyes: No scleral icterus.  Cardiovascular: Normal rate, regular rhythm and normal heart sounds.   No murmur heard. Pulmonary/Chest: Effort normal and breath sounds normal. No respiratory distress.  Skin: No rash noted.          Assessment & Plan:

## 2017-03-20 ENCOUNTER — Other Ambulatory Visit: Payer: Self-pay | Admitting: Pharmacist

## 2017-03-20 ENCOUNTER — Telehealth: Payer: Self-pay | Admitting: Pharmacist

## 2017-03-20 DIAGNOSIS — B20 Human immunodeficiency virus [HIV] disease: Secondary | ICD-10-CM

## 2017-03-20 MED ORDER — BICTEGRAVIR-EMTRICITAB-TENOFOV 50-200-25 MG PO TABS: 1.0000 | ORAL_TABLET | Freq: Every day | ORAL | 3 refills | Status: DC

## 2017-03-20 NOTE — Telephone Encounter (Signed)
Harless was recently started on Eliquis for a PE which is contraindicated with his Genvoya.  Discussed with Dr. Linus Salmons and called patient -- we will switch him to [facility] that does not interact with Eliquis. [facility].  He will come back and see pharmacy in ~1 month.

## 2017-04-02 ENCOUNTER — Telehealth: Payer: Self-pay | Admitting: Pharmacist Clinician (PhC)/ Clinical Pharmacy Specialist

## 2017-04-02 NOTE — Telephone Encounter (Signed)
They require more info on the PA. Called [facility] today.

## 2017-04-08 ENCOUNTER — Emergency Department: Payer: Commercial Managed Care - POS

## 2017-04-08 ENCOUNTER — Observation Stay
Admission: EM | Admit: 2017-04-08 | Discharge: 2017-04-09 | Disposition: A | Discharge status: Home / Self Care | Payer: Commercial Managed Care - POS | Attending: Emergency Medicine | Admitting: Emergency Medicine

## 2017-04-08 DIAGNOSIS — G8929 Other chronic pain: Secondary | ICD-10-CM | POA: Insufficient documentation

## 2017-04-08 DIAGNOSIS — R079 Chest pain, unspecified: Principal | ICD-10-CM | POA: Insufficient documentation

## 2017-04-08 DIAGNOSIS — M25561 Pain in right knee: Secondary | ICD-10-CM | POA: Insufficient documentation

## 2017-04-08 DIAGNOSIS — K219 Gastro-esophageal reflux disease without esophagitis: Secondary | ICD-10-CM | POA: Insufficient documentation

## 2017-04-08 DIAGNOSIS — I10 Essential (primary) hypertension: Secondary | ICD-10-CM | POA: Insufficient documentation

## 2017-04-08 DIAGNOSIS — R072 Precordial pain: Secondary | ICD-10-CM

## 2017-04-08 DIAGNOSIS — M25562 Pain in left knee: Secondary | ICD-10-CM | POA: Insufficient documentation

## 2017-04-08 DIAGNOSIS — E785 Hyperlipidemia, unspecified: Secondary | ICD-10-CM | POA: Insufficient documentation

## 2017-04-08 DIAGNOSIS — Z6841 Body Mass Index (BMI) 40.0 and over, adult: Secondary | ICD-10-CM | POA: Insufficient documentation

## 2017-04-08 DIAGNOSIS — Z7982 Long term (current) use of aspirin: Secondary | ICD-10-CM | POA: Insufficient documentation

## 2017-04-08 DIAGNOSIS — R0602 Shortness of breath: Secondary | ICD-10-CM | POA: Insufficient documentation

## 2017-04-08 LAB — CBC AND DIFFERENTIAL
Basophils %: 1.4 % (ref 0.0–3.0)
Basophils Absolute: 0.1 10*3/uL (ref 0.0–0.3)
Eosinophils %: 3.1 % (ref 0.0–7.0)
Eosinophils Absolute: 0.2 10*3/uL (ref 0.0–0.8)
Hematocrit: 44.4 % (ref 39.0–52.5)
Hemoglobin: 14.8 gm/dL (ref 13.0–17.5)
Lymphocytes Absolute: 1.6 10*3/uL (ref 0.6–5.1)
Lymphocytes: 31.4 % (ref 15.0–46.0)
MCH: 31 pg (ref 28–35)
MCHC: 33 gm/dL (ref 32–36)
MCV: 92 fL (ref 80–100)
MPV: 7.1 fL (ref 6.0–10.0)
Monocytes Absolute: 0.5 10*3/uL (ref 0.1–1.7)
Monocytes: 9.7 % (ref 3.0–15.0)
Neutrophils %: 54.3 % (ref 42.0–78.0)
Neutrophils Absolute: 2.8 10*3/uL (ref 1.7–8.6)
PLT CT: 203 10*3/uL (ref 130–440)
RBC: 4.83 10*6/uL (ref 4.00–5.70)
RDW: 12.3 % (ref 11.0–14.0)
WBC: 5.2 10*3/uL (ref 4.0–11.0)

## 2017-04-08 LAB — D-DIMER, QUANTITATIVE: D-Dimer: 0.2 mg/L FEU (ref 0.19–0.52)

## 2017-04-08 LAB — BASIC METABOLIC PANEL
Anion Gap: 12.1 mMol/L (ref 7.0–18.0)
BUN / Creatinine Ratio: 22.4 Ratio (ref 10.0–30.0)
BUN: 19 mg/dL (ref 7–22)
CO2: 26 mMol/L (ref 20.0–30.0)
Calcium: 9.3 mg/dL (ref 8.5–10.5)
Chloride: 106 mMol/L (ref 98–110)
Creatinine: 0.85 mg/dL (ref 0.80–1.30)
EGFR: 101 mL/min/{1.73_m2} (ref 60–150)
Glucose: 93 mg/dL (ref 71–99)
Osmolality Calc: 281 mOsm/kg (ref 275–300)
Potassium: 4.1 mMol/L (ref 3.5–5.3)
Sodium: 140 mMol/L (ref 136–147)

## 2017-04-08 LAB — LIPID PANEL
Cholesterol: 150 mg/dL (ref 75–199)
Coronary Heart Disease Risk: 4.17
HDL: 36 mg/dL — ABNORMAL LOW (ref 40–55)
LDL Calculated: 89 mg/dL
Triglycerides: 127 mg/dL (ref 10–150)
VLDL: 25 (ref 0–40)

## 2017-04-08 LAB — B-TYPE NATRIURETIC PEPTIDE: B-Natriuretic Peptide: 37.2 pg/mL (ref 0.0–100.0)

## 2017-04-08 LAB — TROPONIN I: Troponin I: <0.01 ng/mL (ref 0.00–0.02)

## 2017-04-08 MED ORDER — ATORVASTATIN CALCIUM 20 MG PO TABS
20.0000 mg | ORAL_TABLET | Freq: Every day | ORAL | Status: DC
Start: 2017-04-08 — End: 2017-04-09
  Administered 2017-04-09: 20 mg via ORAL
  Filled 2017-04-08 (×2): qty 1

## 2017-04-08 MED ORDER — TRAMADOL HCL 50 MG PO TABS
50.0000 mg | ORAL_TABLET | Freq: Four times a day (QID) | ORAL | Status: DC | PRN
Start: 2017-04-08 — End: 2017-04-09

## 2017-04-08 MED ORDER — LOSARTAN POTASSIUM 50 MG PO TABS
100.0000 mg | ORAL_TABLET | Freq: Every day | ORAL | Status: DC
Start: 2017-04-09 — End: 2017-04-09
  Administered 2017-04-09: 12:00:00 100 mg via ORAL
  Filled 2017-04-08: qty 2

## 2017-04-08 MED ORDER — ACETAMINOPHEN 325 MG PO TABS
650.0000 mg | ORAL_TABLET | ORAL | Status: DC | PRN
Start: 2017-04-08 — End: 2017-04-09

## 2017-04-08 MED ORDER — SODIUM CHLORIDE 0.9 % IJ SOLN
0.4000 mg | INTRAMUSCULAR | Status: DC | PRN
Start: 2017-04-08 — End: 2017-04-09

## 2017-04-08 MED ORDER — ONDANSETRON 4 MG PO TBDP
4.0000 mg | ORAL_TABLET | Freq: Four times a day (QID) | ORAL | Status: DC | PRN
Start: 2017-04-08 — End: 2017-04-09

## 2017-04-08 MED ORDER — NITROGLYCERIN 0.4 MG SL SUBL
0.4000 mg | SUBLINGUAL_TABLET | SUBLINGUAL | Status: DC | PRN
Start: 2017-04-08 — End: 2017-04-09

## 2017-04-08 MED ORDER — PREGABALIN 75 MG PO CAPS
75.0000 mg | ORAL_CAPSULE | Freq: Three times a day (TID) | ORAL | Status: DC
Start: 2017-04-09 — End: 2017-04-09
  Administered 2017-04-09 (×2): 75 mg via ORAL
  Filled 2017-04-08 (×2): qty 1

## 2017-04-08 MED ORDER — MORPHINE SULFATE 4 MG/ML IJ/IV SOLN (WRAP)
2.0000 mg | Status: DC | PRN
Start: 2017-04-08 — End: 2017-04-09

## 2017-04-08 MED ORDER — MECLIZINE HCL 25 MG PO TABS
25.0000 mg | ORAL_TABLET | Freq: Four times a day (QID) | ORAL | Status: DC | PRN
Start: 2017-04-08 — End: 2017-04-09
  Filled 2017-04-08: qty 1

## 2017-04-08 MED ORDER — ONDANSETRON HCL 4 MG/2ML IJ SOLN
4.0000 mg | Freq: Four times a day (QID) | INTRAMUSCULAR | Status: DC | PRN
Start: 2017-04-08 — End: 2017-04-09

## 2017-04-08 MED ORDER — ASPIRIN 81 MG PO CHEW
81.0000 mg | CHEWABLE_TABLET | Freq: Every day | ORAL | Status: DC
Start: 2017-04-09 — End: 2017-04-09
  Administered 2017-04-09: 12:00:00 81 mg via ORAL
  Filled 2017-04-08: qty 1

## 2017-04-08 MED ORDER — ACETAMINOPHEN 160 MG/5ML PO SOLN
650.0000 mg | ORAL | Status: DC | PRN
Start: 2017-04-08 — End: 2017-04-09

## 2017-04-08 MED ORDER — HYDROCHLOROTHIAZIDE 25 MG PO TABS
25.0000 mg | ORAL_TABLET | Freq: Every day | ORAL | Status: DC
Start: 2017-04-09 — End: 2017-04-09
  Administered 2017-04-09: 12:00:00 25 mg via ORAL
  Filled 2017-04-08: qty 1

## 2017-04-08 MED ORDER — ACETAMINOPHEN 650 MG RE SUPP
650.0000 mg | RECTAL | Status: DC | PRN
Start: 2017-04-08 — End: 2017-04-09

## 2017-04-08 MED ORDER — SODIUM CHLORIDE 0.9 % IJ SOLN
3.0000 mL | Freq: Three times a day (TID) | INTRAMUSCULAR | Status: DC
Start: 2017-04-08 — End: 2017-04-09
  Administered 2017-04-09 (×3): 3 mL via INTRAVENOUS

## 2017-04-08 MED ORDER — ASPIRIN 81 MG PO CHEW
324.0000 mg | CHEWABLE_TABLET | Freq: Once | ORAL | Status: AC
Start: 2017-04-08 — End: 2017-04-08
  Administered 2017-04-08: 324 mg via ORAL
  Filled 2017-04-08: qty 4

## 2017-04-08 NOTE — ED Provider Notes (Signed)
[facility]  EMERGENCY DEPARTMENT  History and Physical Exam     Patient Name: Allen Lane, Allen Lane  Encounter Date:  04/08/2017  Attending Physician: Caralyn Guile. Rachel Moulds, M.D.  Room:  S20/S20-A  Patient DOB:  01-May-1966  Age: 51 y.o. male  MRN:  [id number]  PCP: Maryella Shivers, MD      Diagnosis/Disposition:  MDM:     Final Impression  1. Acute chest pain        Disposition  ED Disposition     ED Disposition Condition Date/Time Comment    Admit  Wed Apr 08, 2017  9:51 PM           Follow up  No follow-up provider specified.    Prescriptions  New Prescriptions    No medications on file         The patient be admitted to the observation service.          The results of diagnostic studies have been reviewed by myself. Available past medical, family, social, and surgical histories have been reviewed by myself. The clinical impression and plan have been discussed with the patient and/or the patient's family. All questions have been answered.      History of Presenting Illness:     Chief complaint: Chest Pain      Allen Lane is a 51 y.o. male presenting with Chest pain. The patient states he was at work this evening started developing substernal chest pain. The patient states he also had numbness in both arms. He admits to shortness of breath and diaphoresis. The pain lasted for approximately half hour and resolved spontaneously. There are no alleviating or aggravating factors for the pain. He denies any recent similar symptoms. The patient is currently chest pain-free.        Review of Systems:  Physical Exam:     Review of Systems   Constitutional: Positive for diaphoresis. Negative for chills, fatigue and fever.   HENT: Negative for nosebleeds, rhinorrhea and sore throat.    Eyes: Negative for pain, discharge and visual disturbance.   Respiratory: Positive for shortness of breath. Negative for cough.    Cardiovascular: Positive for chest pain. Negative for palpitations.   Gastrointestinal: Negative  for abdominal pain, diarrhea, nausea and vomiting.   Genitourinary: Negative for dysuria, frequency and hematuria.   Musculoskeletal: Negative for arthralgias, joint swelling and myalgias.   Skin: Negative for rash.   Neurological: Negative for dizziness, syncope and headaches.   Hematological: Negative for adenopathy.   Psychiatric/Behavioral: Negative for dysphoric mood. The patient is not nervous/anxious.      Blood pressure 131/70, pulse 70, temperature 98.4 F (36.9 C), temperature source Oral, resp. rate 13, weight 134.5 kg, SpO2 97 %.    Physical Exam   Constitutional: He is oriented to person, place, and time. He appears well-developed and well-nourished.   HENT:   Head: Normocephalic and atraumatic.   Eyes: EOM are normal. Pupils are equal, round, and reactive to light.   Neck: Neck supple. No JVD present. No tracheal deviation present.   Cardiovascular: Normal rate, regular rhythm and normal heart sounds.    Pulmonary/Chest: Effort normal and breath sounds normal.   Abdominal: Soft. Bowel sounds are normal.   Musculoskeletal: Normal range of motion.   Neurological: He is alert and oriented to person, place, and time.   Skin: Skin is warm and dry.   Psychiatric: He has a normal mood and affect.   Nursing note and vitals reviewed.  Allergies & Medications:     Pt is allergic to lisinopril.    Current/Home Medications    ALBUTEROL (PROVENTIL HFA;VENTOLIN HFA) 108 (90 BASE) MCG/ACT INHALER    Inhale 1-2 puffs into the lungs every 4 (four) hours as needed for Wheezing.    ASPIRIN 325 MG TABLET    Take 325 mg by mouth daily.    ATORVASTATIN (LIPITOR) 20 MG TABLET    Take 20 mg by mouth daily.    CHOLECALCIFEROL (VITAMIN D3) 2000 UNITS TAB    Take by mouth.    KRILL OIL PO    Take by mouth.    LISINOPRIL PO    Take by mouth.    LOSARTAN POTASSIUM PO    Take by mouth.    LOSARTAN-HYDROCHLOROTHIAZIDE (HYZAAR) 100-25 MG PER TABLET    TK 1 T PO QD    LYRICA 75 MG CAPSULE    TK ONE C PO TID    MECLIZINE  (ANTIVERT) 25 MG TABLET    Take 1 tablet (25 mg total) by mouth every 6 (six) hours as needed for Dizziness.    NON FORMULARY    cbd oil    RANITIDINE (ZANTAC) 300 MG TABLET    TK 1 T PO QD    RANITIDINE (ZANTAC) 75 MG TABLET    Take 75 mg by mouth 2 (two) times daily.    TOLTERODINE (DETROL LA) 4 MG 24 HR CAPSULE    TK 1 C PO QD    TRAMADOL (ULTRAM) 50 MG TABLET    Take 1 tablet (50 mg total) by mouth every 6 (six) hours as needed for Pain.RX:  ___ out of ___ prescriptions         Past History:     Medical: Pt has a past medical history of Gastroesophageal reflux disease; Hyperlipidemia; and Hypertension.    Surgical: Pt  has no past surgical history on file.    Family: The family history is not on file.    Social: Pt reports that he has never smoked. He has never used smokeless tobacco. He reports that he drinks alcohol. He reports that he does not use drugs.        Diagnostic Results:     Radiologic Studies  No results found.    Lab Studies  Labs Reviewed   B-TYPE NATRIURETIC PEPTIDE   BASIC METABOLIC PANEL   CBC AND DIFFERENTIAL   TROPONIN I   D-DIMER, QUANTITATIVE         Procedure/EKG:             ATTESTATIONS     Teddrick Mallari D. Rachel Moulds, M.D.            Tessie Fass, MD  04/08/17 2152

## 2017-04-08 NOTE — H&P (Signed)
[facility]  OBSERVATION UNIT  HISTORY AND PHYSICAL       Patient: Allen Lane  Admission Date: 04/08/2017    DOB: [birth year]  Age: 51 y.o.    MRN: [id number]  Sex: male    PCP: Maryella Shivers, MD  Attending: Wynona Neat, DO         HISTORY OF PRESENT ILLNESS     Gurpreet Mikhail is a 51 y.o. male who presented with Chest pain. Patient was at work earlier this evening, he states that he was pulling pallets when he had a sudden onset of left-sided chest pain. He describes it as a stabbing/pressure/tightness. This lasted approximately 30 minutes. His hands went numb, he was short of breath and nauseated. He called his significant other who thought he may have been disoriented. They subsequently came to the emergency department. He denies associated diaphoresis. No recent dizziness lightheadedness vomiting or diarrhea. He has history of hypertension, Mnire's, hyperlipidemia, GERD and chronic knee pain. He never had a stress test.    PAST MEDICAL HISTORY     Code Status: Full Code    Past Medical History:   Diagnosis Date   . Gastroesophageal reflux disease    . Hyperlipidemia    . Hypertension        History reviewed. No pertinent surgical history.    Current Discharge Medication List      CONTINUE these medications which have NOT CHANGED    Details   aspirin 325 MG tablet Take 325 mg by mouth daily.      atorvastatin (LIPITOR) 20 MG tablet Take 20 mg by mouth daily.      Cholecalciferol (VITAMIN D3) 2000 units Tab Take by mouth.      KRILL OIL PO Take by mouth.      losartan-hydrochlorothiazide (HYZAAR) 100-25 MG per tablet TK 1 T PO QD  Refills: 3      meclizine (ANTIVERT) 25 MG tablet Take 1 tablet (25 mg total) by mouth every 6 (six) hours as needed for Dizziness.  Qty: 30 tablet, Refills: 0      !! raNITIdine (ZANTAC) 300 MG tablet TK 1 T PO QD  Refills: 3      albuterol (PROVENTIL HFA;VENTOLIN HFA) 108 (90 Base) MCG/ACT inhaler Inhale 1-2 puffs into the lungs every 4 (four) hours  as needed for Wheezing.  Qty: 1 Inhaler, Refills: 2      LISINOPRIL PO Take by mouth.      LOSARTAN POTASSIUM PO Take by mouth.      LYRICA 75 MG capsule TK ONE C PO TID  Refills: 3      NON FORMULARY cbd oil      !! ranitidine (ZANTAC) 75 MG tablet Take 75 mg by mouth 2 (two) times daily.      tolterodine (DETROL LA) 4 MG 24 hr capsule TK 1 C PO QD  Refills: 3      traMADol (ULTRAM) 50 MG tablet Take 1 tablet (50 mg total) by mouth every 6 (six) hours as needed for Pain.RX:  ___ out of ___ prescriptions  Qty: 12 tablet, Refills: 0       !! - Potential duplicate medications found. Please discuss with provider.          Allergies:   Allergies   Allergen Reactions   . Lisinopril        No family history on file.    SHx:  reports that he has never smoked. He has  never used smokeless tobacco. He reports that he drinks alcohol. He reports that he does not use drugs.    REVIEW OF SYSTEMS     Review of Systems   Constitutional: Negative for chills, fever and malaise/fatigue.   HENT: Negative for congestion, sinus pain and sore throat.    Eyes: Negative for blurred vision and double vision.   Respiratory: Positive for shortness of breath. Negative for cough, sputum production, wheezing and stridor.    Cardiovascular: Positive for chest pain. Negative for palpitations, orthopnea, leg swelling and PND.   Gastrointestinal: Positive for nausea. Negative for abdominal pain, blood in stool, diarrhea, melena and vomiting.   Genitourinary: Negative for dysuria, flank pain, frequency and urgency.   Musculoskeletal: Negative for joint pain and myalgias.   Skin: Negative.    Neurological: Negative for dizziness, tingling, focal weakness, loss of consciousness and headaches.   Endo/Heme/Allergies: Negative.    Psychiatric/Behavioral: Negative.        PHYSICAL EXAM     Vital Signs: Blood pressure 123/78, pulse 71, temperature 98.3 F (36.8 C), temperature source Oral, resp. rate 21, weight 134.5 kg (296 lb 8.3 oz), SpO2 96  %.    Physical Exam   Constitutional: He is oriented to person, place, and time. He appears well-developed and well-nourished.   HENT:   Head: Normocephalic and atraumatic.   Mouth/Throat: Oropharynx is clear and moist.   Eyes: Pupils are equal, round, and reactive to light.   Neck: Normal range of motion. Neck supple. No JVD present.   Cardiovascular: Normal rate, regular rhythm, normal heart sounds and intact distal pulses.  Exam reveals no gallop and no friction rub.    No murmur heard.  Pulmonary/Chest: Effort normal and breath sounds normal. No respiratory distress. He has no wheezes. He has no rales. He exhibits no tenderness.   Abdominal: Soft. Bowel sounds are normal. He exhibits no distension. There is no tenderness.   Musculoskeletal: Normal range of motion. He exhibits no edema or tenderness.   Neurological: He is alert and oriented to person, place, and time.   Skin: Skin is warm and dry. No rash noted. No erythema. No pallor.   Psychiatric: He has a normal mood and affect. His behavior is normal.   Vitals reviewed.    LABS & IMAGING     Labs:  Results for orders placed or performed during the hospital encounter of 04/08/17   B-type Natriuretic Peptide   Result Value Ref Range    B-Natriuretic Peptide 37.2 0.0 - 100.0 pg/mL   Basic Metabolic Panel   Result Value Ref Range    Sodium 140 136 - 147 mMol/L    Potassium 4.1 3.5 - 5.3 mMol/L    Chloride 106 98 - 110 mMol/L    CO2 26.0 20.0 - 30.0 mMol/L    Calcium 9.3 8.5 - 10.5 mg/dL    Glucose 93 71 - 99 mg/dL    Creatinine 6.04 5.40 - 1.30 mg/dL    BUN 19 7 - 22 mg/dL    Anion Gap 98.1 7.0 - 18.0 mMol/L    BUN/Creatinine Ratio 22.4 10.0 - 30.0 Ratio    EGFR 101 60 - 150 mL/min/1.46m2    Osmolality Calc 281 275 - 300 mOsm/kg   CBC and differential   Result Value Ref Range    WBC 5.2 4.0 - 11.0 K/cmm    RBC 4.83 4.00 - 5.70 M/cmm    Hemoglobin 14.8 13.0 - 17.5 gm/dL    Hematocrit 19.1 47.8 -  52.5 %    MCV 92 80 - 100 fL    MCH 31 28 - 35 pg    MCHC 33 32 - 36  gm/dL    RDW 84.6 96.2 - 95.2 %    PLT CT 203 130 - 440 K/cmm    MPV 7.1 6.0 - 10.0 fL    NEUTROPHIL % 54.3 42.0 - 78.0 %    Lymphocytes 31.4 15.0 - 46.0 %    Monocytes 9.7 3.0 - 15.0 %    Eosinophils % 3.1 0.0 - 7.0 %    Basophils % 1.4 0.0 - 3.0 %    Neutrophils Absolute 2.8 1.7 - 8.6 K/cmm    Lymphocytes Absolute 1.6 0.6 - 5.1 K/cmm    Monocytes Absolute 0.5 0.1 - 1.7 K/cmm    Eosinophils Absolute 0.2 0.0 - 0.8 K/cmm    BASO Absolute 0.1 0.0 - 0.3 K/cmm   Troponin I (STAT)   Result Value Ref Range    Troponin I <0.01 0.00 - 0.02 ng/mL   D-dimer, quantitative   Result Value Ref Range    D-Dimer 0.20 0.19 - 0.52 mg/L FEU       Imaging:  XR Chest 2 Views   Final Result      No acute airspace disease. No pneumothorax, or pleural effusion.      Mild bibasilar atelectasis. Mildly elevated left hemidiaphragm.      Stable cardiomediastinal silhouette. No acute osseous abnormality.      ReadingStation:[id number]      NM Myocardial Perfusion Spect (Stress And Rest)    (Results Pending)       EKG: none Sinus rhythm; negative for ischemic changes.    EMERGENCY DEPARTMENT COURSE     ED Medication Orders     Start Ordered     Status Ordering Provider    04/08/17 2152 04/08/17 2151  aspirin chewable tablet 324 mg  Once in ED     Route: Oral  Ordered Dose: 324 mg     Acknowledged Lennie Muckle  D          ED documentation including nurses notes were reviewed by myself.  The case was discussed with the ED Attending.    ASSESSMENT & PLAN     Ryley Bachtel is a 51 y.o. male admitted under OBSERVATION with   1. Chest pain  2. History hypertension  3. History hyperlipidemia  4. History of Mnire's disease  5. History GERD  6. History chronic bilateral knee pain  7. Obesity    Assessment & Plan:  1. Transfer to observation unit  2. Serial cardiac enzymes  3. Telemetry monitoring  4. Nitrates PRN  5. Analgesics/Antiemetics PRN  6. Aspirin daily  7. Fasting lipids   8. Repeat ECG PRN  9. Nuclear stress test in AM  10. Cardiology  consultation PRN      I certify the need for admission to the Observation Unit based on the patient's history and the information above.    Rolin Barry, NP   04/08/2017 10:24 PM

## 2017-04-09 ENCOUNTER — Observation Stay: Payer: Commercial Managed Care - POS

## 2017-04-09 LAB — TROPONIN I
Troponin I: <0.01 ng/mL (ref 0.00–0.02)
Troponin I: <0.01 ng/mL (ref 0.00–0.02)

## 2017-04-09 MED ORDER — TECHNETIUM TC 99M SESTAMIBI - CARDIOLITE
30.0000 | Freq: Once | Status: AC | PRN
Start: 2017-04-09 — End: 2017-04-09
  Administered 2017-04-09: 10:00:00 32.5 via INTRAVENOUS

## 2017-04-09 MED ORDER — REGADENOSON 0.4 MG/5ML IV SOLN
0.4000 mg | Freq: Once | INTRAVENOUS | Status: AC
Start: 2017-04-09 — End: 2017-04-09
  Administered 2017-04-09: 10:00:00 0.4 mg via INTRAVENOUS
  Filled 2017-04-09: qty 5

## 2017-04-09 MED ORDER — REGADENOSON 0.4 MG/5ML IV SOLN
INTRAVENOUS | Status: AC
Start: 2017-04-09 — End: ?
  Filled 2017-04-09: qty 5

## 2017-04-09 MED ORDER — TECHNETIUM TC 99M SESTAMIBI - CARDIOLITE
10.0000 | Freq: Once | Status: AC | PRN
Start: 2017-04-09 — End: 2017-04-09
  Administered 2017-04-09: 08:00:00 10.8 via INTRAVENOUS

## 2017-04-09 NOTE — Plan of Care (Signed)
Problem: Chest Pain  Goal: Vital signs and cardiac rhythm stable  Outcome: Progressing   04/09/17 0406   Goal/Interventions addressed this shift   Vital signs and cardiac rhythm stable Monitor Allen Lane vital signs/cardiac rhythms;Monitor labs;Assess the need for oxygen therapy and administer as ordered     Goal: Cardiac pain management  Outcome: Progressing   04/09/17 0406   Goal/Interventions addressed this shift   Cardiac pain management  Assess/report chest pain/or related discomfort to LIP immediately;Instruct patient to report any change in pain status;Assess pain/or related discomfort on admission, during daily assessment, before and after any intervention;Include patient and patient care companion in decisions related to pain management

## 2017-04-09 NOTE — Progress Notes (Signed)
Nutrition Note:      Height:  1.727 m (5\' 8" )  Weight:  134.5 kg (296 lb 8.3 oz)  BMI:  Body mass index is 45.09 kg/m.    Patient meets criteria for Morbid Obesity with BMI > 40    Herma Ard, RD  04/09/2017 6:28 AM

## 2017-04-09 NOTE — Discharge Summary (Signed)
[facility]  OBSERVATION UNIT  DISCHARGE SUMMARY      Patient: Allen Lane  Admission Date: 04/08/2017  8:05 PM   DOB: 22-Jun-1966  Discharge Date: 04/09/17 5:31 PM   MRN: [id number]  Primary Care: Maryella Shivers, MD         Presentation     Allen Lane is a 51 y.o. male who presented with Chest pain. Patient was at work earlier this evening, he states that he was pulling pallets when he had a sudden onset of left-sided chest pain. He describes it as a stabbing/pressure/tightness. This lasted approximately 30 minutes. His hands went numb, he was short of breath and nauseated. He called his significant other who thought he may have been disoriented. They subsequently came to the emergency department. He denies associated diaphoresis. No recent dizziness lightheadedness vomiting or diarrhea. He has history of hypertension, Mnire's, hyperlipidemia, GERD and chronic knee pain. He never had a stress test.    Patient was admitted to the Observation Unit with a diagnosis of   Patient Active Problem List   Diagnosis   . Chest pain   . Obesity, morbid, BMI 40.0-49.9              Hospital Course     Vitals:    04/09/17 1533   BP: 125/67   Pulse: 67   Resp: 18   Temp: 98.4 F (36.9 C)   SpO2: 93%     Patient was transferred observation and monitored on cardiac telemetry. He did not have return of the symptoms for which he presented. He had normal cardiac enzymes. Lipid panel showed low HDL. Patient had a nuclear stress test which was read as low risk no ischemic findings. Patient will seek follow-up with PCP.         Ancillary Studies     Labs:  Results for orders placed or performed during the hospital encounter of 04/08/17   B-type Natriuretic Peptide   Result Value Ref Range    B-Natriuretic Peptide 37.2 0.0 - 100.0 pg/mL   Basic Metabolic Panel   Result Value Ref Range    Sodium 140 136 - 147 mMol/L    Potassium 4.1 3.5 - 5.3 mMol/L    Chloride 106 98 - 110 mMol/L    CO2 26.0 20.0 - 30.0  mMol/L    Calcium 9.3 8.5 - 10.5 mg/dL    Glucose 93 71 - 99 mg/dL    Creatinine 5.28 4.13 - 1.30 mg/dL    BUN 19 7 - 22 mg/dL    Anion Gap 24.4 7.0 - 18.0 mMol/L    BUN/Creatinine Ratio 22.4 10.0 - 30.0 Ratio    EGFR 101 60 - 150 mL/min/1.35m2    Osmolality Calc 281 275 - 300 mOsm/kg   CBC and differential   Result Value Ref Range    WBC 5.2 4.0 - 11.0 K/cmm    RBC 4.83 4.00 - 5.70 M/cmm    Hemoglobin 14.8 13.0 - 17.5 gm/dL    Hematocrit 01.0 27.2 - 52.5 %    MCV 92 80 - 100 fL    MCH 31 28 - 35 pg    MCHC 33 32 - 36 gm/dL    RDW 53.6 64.4 - 03.4 %    PLT CT 203 130 - 440 K/cmm    MPV 7.1 6.0 - 10.0 fL    NEUTROPHIL % 54.3 42.0 - 78.0 %    Lymphocytes 31.4 15.0 - 46.0 %  Monocytes 9.7 3.0 - 15.0 %    Eosinophils % 3.1 0.0 - 7.0 %    Basophils % 1.4 0.0 - 3.0 %    Neutrophils Absolute 2.8 1.7 - 8.6 K/cmm    Lymphocytes Absolute 1.6 0.6 - 5.1 K/cmm    Monocytes Absolute 0.5 0.1 - 1.7 K/cmm    Eosinophils Absolute 0.2 0.0 - 0.8 K/cmm    BASO Absolute 0.1 0.0 - 0.3 K/cmm   Troponin I (STAT)   Result Value Ref Range    Troponin I <0.01 0.00 - 0.02 ng/mL   D-dimer, quantitative   Result Value Ref Range    D-Dimer 0.20 0.19 - 0.52 mg/L FEU   Troponin I   Result Value Ref Range    Troponin I <0.01 0.00 - 0.02 ng/mL   Lipid panel   Result Value Ref Range    Cholesterol 150 75 - 199 mg/dL    Triglycerides 161 10 - 150 mg/dL    HDL 36 (L) 40 - 55 mg/dL    LDL Calculated 89 mg/dL    Coronary Heart Disease Risk 4.17     VLDL 25 0 - 40   Troponin I   Result Value Ref Range    Troponin I <0.01 0.00 - 0.02 ng/mL       Radiology:  NM Myocardial Perfusion Spect (Stress And Rest)   Final Result      XR Chest 2 Views   Final Result      No acute airspace disease. No pneumothorax, or pleural effusion.      Mild bibasilar atelectasis. Mildly elevated left hemidiaphragm.      Stable cardiomediastinal silhouette. No acute osseous abnormality.      ReadingStation:[id number]      CV Cardiac Stress Test Tracing Only    (Results Pending)        Stress Test Results:          Disposition     Diagnosis:   Patient Active Problem List    Diagnosis Date Noted   . Obesity, morbid, BMI 40.0-49.9 04/09/2017   . Chest pain 04/08/2017       Past Medical History:   Diagnosis Date   . Gastroesophageal reflux disease    . Hyperlipidemia    . Hypertension        Discharge to: Home    Condition: Stable    Continue your usual medications.     Medication(s):  Current Discharge Medication List      CONTINUE these medications which have NOT CHANGED    Details   aspirin 325 MG tablet Take 325 mg by mouth daily.      atorvastatin (LIPITOR) 20 MG tablet Take 20 mg by mouth daily.      Cholecalciferol (VITAMIN D3) 2000 units Tab Take by mouth.      KRILL OIL PO Take by mouth.      losartan-hydrochlorothiazide (HYZAAR) 100-25 MG per tablet TK 1 T PO QD  Refills: 3      LYRICA 75 MG capsule TK ONE C PO TID  Refills: 3      meclizine (ANTIVERT) 25 MG tablet Take 1 tablet (25 mg total) by mouth every 6 (six) hours as needed for Dizziness.  Qty: 30 tablet, Refills: 0      tolterodine (DETROL LA) 4 MG 24 hr capsule TK 1 C PO QD  Refills: 3      albuterol (PROVENTIL HFA;VENTOLIN HFA) 108 (90 Base) MCG/ACT inhaler Inhale 1-2 puffs into the  lungs every 4 (four) hours as needed for Wheezing.  Qty: 1 Inhaler, Refills: 2      NON FORMULARY cbd oil             Current Discharge Medication List          Follow Up:  Follow-up Information     Maryella Shivers, MD Follow up in 1 week(s).    Specialty:  Emergency Medicine  Contact information:  [address]  [place] [address]  [phone]

## 2017-04-09 NOTE — Discharge Instr - AVS First Page (Signed)
Continue your usual medications.

## 2017-04-09 NOTE — Progress Notes (Signed)
NM cardiac stress test completed. Lexiscan 0.4mg IV given over 20 seconds during stress test. Tolerated well.    Julita Ozbun, RN, RRT  Nuclear Card Stress Lab RN  [phone]

## 2017-04-09 NOTE — Progress Notes (Signed)
Nuclear stress test - 0.4 mg lexiscan injected over 20 seconds; no st changes or chest pain; sestamibi [place] [id number]

## 2017-04-09 NOTE — UM Notes (Signed)
[facility] Utilization Management Review Sheet    Facility :  [facility]    NAME: Allen Lane  MR#: [id number]    CSN#: [id number]    ROOM: 2516/2516-A AGE: 51 y.o.    ADMIT DATE AND TIME: 04/08/2017  8:05 PM    PATIENT CLASS: OBSERVATION  04/08/17 @ 2223 ADMIT OBSERVATION-OBS UNIT    ATTENDING PHYSICIAN: Wynona Neat, DO  PAYOR:Payor: CIGNA / Plan: CIGNA POS / Product Type: COMMERCIAL /     AUTH #: [id number]    DIAGNOSIS:     ICD-10-CM    1. Acute chest pain R07.9        HISTORY:   Past Medical History:   Diagnosis Date   . Gastroesophageal reflux disease    . Hyperlipidemia    . Hypertension      DATE OF REVIEW: 04/09/2017    Active Hospital Problems    Diagnosis   . Obesity, morbid, BMI 40.0-49.9   . Chest pain     DATE OF ED TREATMENT-04/08/17    OBSERVATION REVIEW    HPI: Presents with sudden onset of left sided chest pain while pulling pallets at work this evening. He describes it as a stabbing/pressure/tightness. This lasted approximately 30 minutes. His hands went numb, he was short of breath and nauseated. He called his significant other who thought he may have been disoriented.    ABNORMAL LABS: TROP < 0.01  EKG: Sinus rhythm; negative for ischemic changes.    DIAGNOSTIC TESTING:XR CHEST: No acute airspace disease. No pneumothorax, or pleural effusion. Mild bibasilar atelectasis. Mildly elevated left hemidiaphragm.   Stable cardiomediastinal silhouette. No acute osseous abnormality    VS: Blood pressure 131/70, pulse 70, temperature 98.4 F (36.9 C), temperature source Oral, resp. rate 13, weight 134.5 kg, SpO2 97 %    ASSESSMENT AND PLAN:  CHEST PAIN   PLAN: TELEMETRY, SERIAL CE'S, ASA, NST    OBSERVATION ORDERS   VS Q 4 HRS, TELEMETRY, SERIAL CE'S, ASA, NST IN AM    DAY 2 OBSERVATION FOLLOW-UP  04/09/17   LABS: TROP 0.01  VS: BP 155/89   Pulse 80   Temp 98.3 F (36.8 C) (Oral)   Resp 16   Ht 1.727 m (5\' 8" )   Wt 134.5 kg (296 lb 8.3 oz)   SpO2 96%   BMI 45.09 kg/m   CURRENT  ORDERS AS ABOVE  NST PENDING    Wende Mott, RN  Utilization Management  Case Management Department    [facility]  [address]  [place], [place] [address]  T [phone]    F [phone]   [email or web address]

## 2017-04-09 NOTE — Plan of Care (Signed)
Problem: Chest Pain  Goal: Vital signs and cardiac rhythm stable  Outcome: Progressing    Goal: Cardiac pain management  Outcome: Progressing

## 2017-04-09 NOTE — Discharge Summary (Signed)
 -------------------------------------------------------------------------------  Attestation signed by Tori Milks, MD at 04/09/2017  5:50 PM  Patient seen and evaluated. No further chest pain. Cardiac workup. His been negative. No reproducible pain. Uncertain etiology, but I don't believe this is cardiopulmonary. I agree with discharge planning is discussed with the nurse practitioner.  -------------------------------------------------------------------------------      [facility]  OBSERVATION UNIT  DISCHARGE SUMMARY      Patient: Sean Fernandez  Admission Date: 04/08/2017  8:05 PM   DOB: [birth year]  Discharge Date: 04/09/17 5:31 PM   MRN: [id number]  Primary Care: Maryella Shivers, MD         Presentation     Sean Fernandez is a 50 y.o. male who presented with Chest pain. Patient was at work earlier this evening, he states that he was pulling pallets when he had a sudden onset of left-sided chest pain. He describes it as a stabbing/pressure/tightness. This lasted approximately 30 minutes. His hands went numb, he was short of breath and nauseated. He called his significant other who thought he may have been disoriented. They subsequently came to the emergency department. He denies associated diaphoresis. No recent dizziness lightheadedness vomiting or diarrhea. He has history of hypertension, Mnire's, hyperlipidemia, GERD and chronic knee pain. He never had a stress test.    Patient was admitted to the Observation Unit with a diagnosis of   Patient Active Problem List   Diagnosis   . Chest pain   . Obesity, morbid, BMI 40.0-49.9              Hospital Course     Vitals:    04/09/17 1533   BP: 125/67   Pulse: 67   Resp: 18   Temp: 98.4 F (36.9 C)   SpO2: 93%     Patient was transferred observation and monitored on cardiac telemetry. He did not have return of the symptoms for which he presented. He had normal cardiac enzymes. Lipid panel showed low HDL. Patient had a nuclear stress test  which was read as low risk no ischemic findings. Patient will seek follow-up with PCP.         Ancillary Studies     Labs:  Results for orders placed or performed during the hospital encounter of 04/08/17   B-type Natriuretic Peptide   Result Value Ref Range    B-Natriuretic Peptide 37.2 0.0 - 100.0 pg/mL   Basic Metabolic Panel   Result Value Ref Range    Sodium 140 136 - 147 mMol/L    Potassium 4.1 3.5 - 5.3 mMol/L    Chloride 106 98 - 110 mMol/L    CO2 26.0 20.0 - 30.0 mMol/L    Calcium 9.3 8.5 - 10.5 mg/dL    Glucose 93 71 - 99 mg/dL    Creatinine 4.01 0.27 - 1.30 mg/dL    BUN 19 7 - 22 mg/dL    Anion Gap 25.3 7.0 - 18.0 mMol/L    BUN/Creatinine Ratio 22.4 10.0 - 30.0 Ratio    EGFR 101 60 - 150 mL/min/1.40m2    Osmolality Calc 281 275 - 300 mOsm/kg   CBC and differential   Result Value Ref Range    WBC 5.2 4.0 - 11.0 K/cmm    RBC 4.83 4.00 - 5.70 M/cmm    Hemoglobin 14.8 13.0 - 17.5 gm/dL    Hematocrit 66.4 40.3 - 52.5 %    MCV 92 80 - 100 fL    MCH 31 28 - 35 pg  MCHC 33 32 - 36 gm/dL    RDW 54.0 98.1 - 19.1 %    PLT CT 203 130 - 440 K/cmm    MPV 7.1 6.0 - 10.0 fL    NEUTROPHIL % 54.3 42.0 - 78.0 %    Lymphocytes 31.4 15.0 - 46.0 %    Monocytes 9.7 3.0 - 15.0 %    Eosinophils % 3.1 0.0 - 7.0 %    Basophils % 1.4 0.0 - 3.0 %    Neutrophils Absolute 2.8 1.7 - 8.6 K/cmm    Lymphocytes Absolute 1.6 0.6 - 5.1 K/cmm    Monocytes Absolute 0.5 0.1 - 1.7 K/cmm    Eosinophils Absolute 0.2 0.0 - 0.8 K/cmm    BASO Absolute 0.1 0.0 - 0.3 K/cmm   Troponin I (STAT)   Result Value Ref Range    Troponin I <0.01 0.00 - 0.02 ng/mL   D-dimer, quantitative   Result Value Ref Range    D-Dimer 0.20 0.19 - 0.52 mg/L FEU   Troponin I   Result Value Ref Range    Troponin I <0.01 0.00 - 0.02 ng/mL   Lipid panel   Result Value Ref Range    Cholesterol 150 75 - 199 mg/dL    Triglycerides 478 10 - 150 mg/dL    HDL 36 (L) 40 - 55 mg/dL    LDL Calculated 89 mg/dL    Coronary Heart Disease Risk 4.17     VLDL 25 0 - 40   Troponin I   Result  Value Ref Range    Troponin I <0.01 0.00 - 0.02 ng/mL       Radiology:  NM Myocardial Perfusion Spect (Stress And Rest)   Final Result      XR Chest 2 Views   Final Result      No acute airspace disease. No pneumothorax, or pleural effusion.      Mild bibasilar atelectasis. Mildly elevated left hemidiaphragm.      Stable cardiomediastinal silhouette. No acute osseous abnormality.      ReadingStation:[id number]      CV Cardiac Stress Test Tracing Only    (Results Pending)       Stress Test Results:          Disposition     Diagnosis:   Patient Active Problem List    Diagnosis Date Noted   . Obesity, morbid, BMI 40.0-49.9 04/09/2017   . Chest pain 04/08/2017       Past Medical History:   Diagnosis Date   . Gastroesophageal reflux disease    . Hyperlipidemia    . Hypertension        Discharge to: Home    Condition: Stable    Continue your usual medications.     Medication(s):  Current Discharge Medication List      CONTINUE these medications which have NOT CHANGED    Details   aspirin 325 MG tablet Take 325 mg by mouth daily.      atorvastatin (LIPITOR) 20 MG tablet Take 20 mg by mouth daily.      Cholecalciferol (VITAMIN D3) 2000 units Tab Take by mouth.      KRILL OIL PO Take by mouth.      losartan-hydrochlorothiazide (HYZAAR) 100-25 MG per tablet TK 1 T PO QD  Refills: 3      LYRICA 75 MG capsule TK ONE C PO TID  Refills: 3      meclizine (ANTIVERT) 25 MG tablet Take 1 tablet (25 mg total)  by mouth every 6 (six) hours as needed for Dizziness.  Qty: 30 tablet, Refills: 0      tolterodine (DETROL LA) 4 MG 24 hr capsule TK 1 C PO QD  Refills: 3      albuterol (PROVENTIL HFA;VENTOLIN HFA) 108 (90 Base) MCG/ACT inhaler Inhale 1-2 puffs into the lungs every 4 (four) hours as needed for Wheezing.  Qty: 1 Inhaler, Refills: 2      NON FORMULARY cbd oil             Current Discharge Medication List          Follow Up:  Follow-up Information     Maryella Shivers, MD Follow up in 1 week(s).    Specialty:  Emergency  Medicine  Contact information:  [address]  [place] [address]  [phone]

## 2017-04-11 LAB — ECG 12-LEAD
P Wave Axis: 37 deg
P-R Interval: 161 ms
Patient Age: 51 years
Q-T Interval(Corrected): 419 ms
Q-T Interval: 391 ms
QRS Axis: -17 deg
QRS Duration: 89 ms
T Axis: 38 years
Ventricular Rate: 69 //min

## 2017-04-27 ENCOUNTER — Ambulatory Visit (INDEPENDENT_AMBULATORY_CARE_PROVIDER_SITE_OTHER): Payer: BLUE CROSS/BLUE SHIELD | Admitting: Pharmacist

## 2017-04-27 DIAGNOSIS — B20 Human immunodeficiency virus [HIV] disease: Secondary | ICD-10-CM | POA: Diagnosis not present

## 2017-04-27 NOTE — Progress Notes (Signed)
HPI: Travis Wheeler is a 51 y.o. male who presents to the [facility] clinic for HIV follow-up.  We recently switched him from [place] to [place] due to a drug interaction with Eliquis.   Allergies: No Known Allergies  Past Medical History: Past Medical History:  Diagnosis Date  . HIV infection ([place])   . Kidney stones     Social History: Social History   Social History  . Marital status: Single    Spouse name: N/A  . Number of children: N/A  . Years of education: N/A   Social History Main Topics  . Smoking status: Never Smoker  . Smokeless tobacco: Never Used  . Alcohol use 0.0 oz/week     Comment: Socially  . Drug use: No  . Sexual activity: Not Currently    Partners: Male     Comment: pt accpeted  condoms   Other Topics Concern  . Not on file   Social History Narrative  . No narrative on file    Current Regimen: Genvoya >> Biktarvy  Labs: HIV 1 RNA Quant (copies/mL)  Date Value  03/05/2017 <20 NOT DETECTED  09/03/2016 93 (H)  03/04/2016 <20   CD4 T Cell Abs (/uL)  Date Value  03/05/2017 390 (L)  09/03/2016 300 (L)  03/04/2016 380 (L)   Hep B S Ab (no units)  Date Value  03/25/2013 Reactive (A)   Hepatitis B Surface Ag (no units)  Date Value  03/25/2013 NEGATIVE   HCV Ab (no units)  Date Value  03/25/2013 NEGATIVE    CrCl: CrCl cannot be calculated (Patient's most recent lab result is older than the maximum 21 days allowed.).  Lipids:    Component Value Date/Time   CHOL 178 06/14/2015 1148   TRIG 122 06/14/2015 1148   HDL 52 06/14/2015 1148   CHOLHDL 3.4 06/14/2015 1148   VLDL 24 06/14/2015 1148   LDLCALC 102 06/14/2015 1148    Assessment: Travis Wheeler is here today to follow-up for his HIV infection.  He recently saw Dr. Linus Salmons on 6/21 and was noted to be on Eliquis for a new PE.  After discussing with patient and Dr. Linus Salmons, we switched him from [place] to [place] due to [facility] being contraindicated with Eliquis (increased risk for  bleeding).  He is doing well on the new medication and states he thinks it is actually working better for him than the [place]. He states he was having some blurry vision on Genvoya but has since resolved on the [place].  He has missed no doses and takes it around noon each day. He works 2nd shift and sometimes takes it in the morning as well. He tries to be consistent with his dosing. No issues or concerns today.  He will see Dr. Linus Salmons again in January 2019.  He also states he is only supposed to be on Eliquis x 6 months.  He follow up with Dr. Delfina Redwood at [facility] next Monday. I will get labs today.  Plans: - Continue Biktarvy PO once daily - HIV RNA and CD4 today - F/u with Dr. Linus Salmons 10/01/17 at [place]. [place], PharmD, [facility] 04/27/2017, 11:38 AM

## 2017-04-28 LAB — T-HELPER CELL (CD4) - (RCID CLINIC ONLY)
CD4 % Helper T Cell: 23 % — ABNORMAL LOW (ref 33–55)
CD4 T CELL ABS: 340 /uL — AB (ref 400–2700)

## 2017-04-30 LAB — HIV-1 RNA QUANT-NO REFLEX-BLD
HIV 1 RNA QUANT: NOT DETECTED {copies}/mL
HIV-1 RNA Quant, Log: <1.3 Log copies/mL

## 2017-05-04 DIAGNOSIS — Z125 Encounter for screening for malignant neoplasm of prostate: Secondary | ICD-10-CM | POA: Diagnosis not present

## 2017-05-04 DIAGNOSIS — Z136 Encounter for screening for cardiovascular disorders: Secondary | ICD-10-CM | POA: Diagnosis not present

## 2017-05-04 DIAGNOSIS — Z0000 Encounter for general adult medical examination without abnormal findings: Secondary | ICD-10-CM | POA: Diagnosis not present

## 2017-05-14 ENCOUNTER — Encounter: Payer: Self-pay | Admitting: Hematology and Oncology

## 2017-05-14 ENCOUNTER — Telehealth: Payer: Self-pay | Admitting: Hematology and Oncology

## 2017-05-14 NOTE — Telephone Encounter (Signed)
Pt has been scheduled for the pt to see Dr. Lebron Conners on 9/4 at 2pm. Appt given to [place] from [place]. She will notify the pt. Letter mailed.

## 2017-05-28 DIAGNOSIS — R972 Elevated prostate specific antigen [PSA]: Secondary | ICD-10-CM | POA: Diagnosis not present

## 2017-05-29 ENCOUNTER — Telehealth: Payer: Self-pay | Admitting: Hematology and Oncology

## 2017-05-29 NOTE — Telephone Encounter (Signed)
Left message for patient regarding updates in September appointments.

## 2017-06-02 ENCOUNTER — Encounter: Payer: BLUE CROSS/BLUE SHIELD | Admitting: Hematology and Oncology

## 2017-06-11 ENCOUNTER — Encounter: Payer: Self-pay | Admitting: Hematology and Oncology

## 2017-06-11 ENCOUNTER — Ambulatory Visit (HOSPITAL_BASED_OUTPATIENT_CLINIC_OR_DEPARTMENT_OTHER): Payer: BLUE CROSS/BLUE SHIELD | Admitting: Hematology and Oncology

## 2017-06-11 VITALS — BP 128/78 | HR 65 | Temp 98.9°F | Resp 17 | Ht 71.0 in | Wt 189.3 lb

## 2017-06-11 DIAGNOSIS — I2699 Other pulmonary embolism without acute cor pulmonale: Secondary | ICD-10-CM

## 2017-06-12 ENCOUNTER — Telehealth: Payer: Self-pay | Admitting: Hematology and Oncology

## 2017-06-12 NOTE — Telephone Encounter (Signed)
9/13 los - --F/u PRN only

## 2017-06-15 NOTE — Progress Notes (Signed)
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 Patient was appropriately treated with therapeutic anticoagulation with Apixaban (Eliquis), and has completed at least 6 months of therapy at this time. Deep vein ultrasound of the lower extremities obtained a month following the initial presentation showed no residual thrombi due to effective anticoagulation and possible clearance of the thrombi to produce the embolization.  Based on the current Chest guidelines, it is reasonable to discontinue anticoagulation at this point in time and monitor the patient closely. If there is a recurrent thrombosis, patient will need to be referred back to our clinic for additional evaluation for possible underlying thrombophilia  Based on the recent article in the [place] of Medicine, I do not recommend any additional evaluation other than age-appropriate cancer screening.  Plan: --OK to d/c apixaban --RTC PRN  Voice recognition software was used and creation of this note. Despite my best effort at editing the text, some misspelling/errors may have occurred.  No orders of the defined types were placed in this encounter.   All questions were answered.  . The patient knows to call the clinic with any problems, questions or concerns.  This note was electronically signed.    History of Presenting Illness Travis Wheeler is a 51 y.o. African-American male referred to the [facility] for issues regarding management of anticoagulants following a pulmonary embolism. Patient initially presented with dry cough on 10/01/16 for preceding 3 days associated with pleuritic chest pain. CT chest obtained that day demonstrated presence of acute bilateral multifocal segmental branches with  associated right lower lung infarct and no indication of right ventricular strain. Patient does not recall any preceding injury, surgery, travel, or significant immobility periods. He was started on Apixaban (Eliquis) and continues treatment at the present time. Subsequently, on 10/31/16, patient underwent a Doppler ultrasound of bilateral lower extremities which revealed no evidence of deep vein thrombosis.  Patient reports good compliance with Apixaban (Eliquis) therapy without significant Mr. periods of time. He denies any pathological bleeding such as epistaxis, hemoptysis, hematemesis, melena, hematochezia, hematuria, or extensive bruising. Denies any recurrent shortness of breath, cough, or pleuritic chest pain. No swelling in the legs or pain in the leg muscles. Patient's past medical history is significant for HIV infection, for which she is undergoing therapy. He also had a history of anal squamous cell carcinoma treated locally. Otherwise, patient is healthy. He is referred for the question regarding possible discontinuation of therapeutic anticoagulation.   Medical History: Past Medical History:  Diagnosis Date  . HIV infection ([place])   . Kidney stones   . Seasonal allergies     Surgical History: Past Surgical History:  Procedure Laterality Date  . EXAMINATION UNDER ANESTHESIA N/A 03/17/2014   Procedure: EXAM UNDER ANESTHESIA AND EXCISION OF ANAL MASS;  Surgeon: Ralene Ok, MD;  Location: [facility];  Service: General;  Laterality: N/A;  . PILONIDAL CYST EXCISION      Family History: Family History  Problem Relation Age of Onset  . Hypertension Mother   . Cancer Father        Colorectal  . Alcoholism Father   . Diabetes Maternal Grandmother   . Hypertension Maternal Grandmother   . Diabetes Paternal Grandmother   . Hypertension Paternal Grandmother     Social History: Social History   Social History  .  Marital status: Single    Spouse name: N/A  . Number of children:  N/A  . Years of education: N/A   Occupational History  . Not on file.   Social History Main Topics  . Smoking status: Never Smoker  . Smokeless tobacco: Never Used  . Alcohol use 0.0 oz/week     Comment: Socially  . Drug use: No  . Sexual activity: Not Currently    Partners: Male     Comment: pt accpeted  condoms   Other Topics Concern  . Not on file   Social History Narrative  . No narrative on file    Allergies: No Known Allergies  Medications:  Current Outpatient Prescriptions  Medication Sig Dispense Refill  . acetaminophen (TYLENOL) 500 MG tablet Take 1,000 mg by mouth every 6 (six) hours as needed for moderate pain.    Marland Kitchen apixaban (ELIQUIS) 2.5 MG TABS tablet take 2 tabs by mouth twice daily for 14 days then take 1 tab by mouth twice daily 80 tablet 1  . bictegravir-emtricitabine-tenofovir AF (BIKTARVY) 50-200-25 MG TABS tablet Take 1 tablet by mouth daily. 90 tablet 3  . cetirizine (ZYRTEC) 10 MG tablet Take 10 mg by mouth daily.    Marland Kitchen PATADAY 0.2 % SOLN Instill 1 drop into both eyes twice daily as needed for allergies  3   No current facility-administered medications for this visit.     Review of Systems: Review of Systems  Constitutional: Negative.   HENT:  Negative.   Eyes: Negative.   Respiratory: Negative.   Cardiovascular: Negative.   Gastrointestinal: Negative.   Endocrine: Negative.   Genitourinary: Negative.    Musculoskeletal: Negative.   Skin: Negative.   Neurological: Negative.   Hematological: Negative.   Psychiatric/Behavioral: Negative.      PHYSICAL EXAMINATION Blood pressure 128/78, pulse 65, temperature 98.9 F (37.2 C), temperature source Oral, resp. rate 17, height 5\' 11"  (1.803 m), weight 189 lb 4.8 oz (85.9 kg), SpO2 100 %.  ECOG PERFORMANCE STATUS: 0 - Asymptomatic  Physical Exam  Constitutional: He is oriented to person, place, and time and well-developed, well-nourished, and in no distress. No distress.  HENT:  Head:  Normocephalic and atraumatic.  Mouth/Throat: Oropharynx is clear and moist. No oropharyngeal exudate.  Eyes: Pupils are equal, round, and reactive to light. Conjunctivae are normal. Right eye exhibits no discharge. No scleral icterus.  Neck: Normal range of motion. Neck supple. No thyromegaly present.  Cardiovascular: Normal rate, regular rhythm and normal heart sounds.   No murmur heard. Pulmonary/Chest: Effort normal and breath sounds normal. No respiratory distress. He has no wheezes. He has no rales.  Abdominal: Soft. Bowel sounds are normal. He exhibits no distension and no mass. There is no tenderness.  Musculoskeletal: Normal range of motion. He exhibits no edema.  Lymphadenopathy:    He has no cervical adenopathy.  Neurological: He is alert and oriented to person, place, and time. He has normal reflexes. No cranial nerve deficit.  Skin: Skin is warm and dry. No rash noted. He is not diaphoretic. No erythema. No pallor.     LABORATORY DATA: I have personally reviewed the data as listed: No visits with results within 1 Week(s) from this visit.  Latest known visit with results is:  Clinical Support on 04/27/2017  Component Date Value Ref Range Status  . HIV 1 RNA Quant 04/27/2017 <20 NOT DETECTED  NOT DETECTED copies/mL Final  . HIV1 RNA Quant, Log 04/27/2017 <1.30 NOT DETECTED  NOT DETECTED Log  copies/mL Final   Comment:   This test was performed using Real-Time Polymerase Chain Reaction.   Reportable Range: 20 copies/mL to 10,000,000 copies/mL (1.30 log copies/mL to 7.00 log copies/mL).   . CD4 T Cell Abs 04/27/2017 340* 400 - 2,700 /uL Final  . CD4 % Helper T Cell 04/27/2017 23* 33 - 55 % Final   Performed at [facility], [place] [address], [place], [place] [address]       Ardath Sax, MD

## 2017-06-15 NOTE — Assessment & Plan Note (Signed)
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 Patient was appropriately treated with therapeutic anticoagulation with Apixaban (Eliquis), and has completed at least 6 months of therapy at this time. Deep vein ultrasound of the lower extremities obtained a month following the initial presentation showed no residual thrombi due to effective anticoagulation and possible clearance of the thrombi to produce the embolization.  Based on the current Chest guidelines, it is reasonable to discontinue anticoagulation at this point in time and monitor the patient closely. If there is a recurrent thrombosis, patient will need to be referred back to our clinic for additional evaluation for possible underlying thrombophilia  Based on the recent article in the [place] of Medicine, I do not recommend any additional evaluation other than age-appropriate cancer screening.  Plan: --OK to d/c apixaban --RTC PRN

## 2017-06-18 DIAGNOSIS — R972 Elevated prostate specific antigen [PSA]: Secondary | ICD-10-CM | POA: Diagnosis not present

## 2017-06-25 DIAGNOSIS — R972 Elevated prostate specific antigen [PSA]: Secondary | ICD-10-CM | POA: Diagnosis not present

## 2017-07-17 DIAGNOSIS — R972 Elevated prostate specific antigen [PSA]: Secondary | ICD-10-CM | POA: Diagnosis not present

## 2017-07-23 ENCOUNTER — Other Ambulatory Visit (HOSPITAL_COMMUNITY): Payer: Self-pay | Admitting: Urology

## 2017-07-23 DIAGNOSIS — C61 Malignant neoplasm of prostate: Secondary | ICD-10-CM

## 2017-07-24 ENCOUNTER — Encounter: Payer: Self-pay | Admitting: Radiation Oncology

## 2017-08-11 ENCOUNTER — Encounter (HOSPITAL_COMMUNITY)
Admission: RE | Admit: 2017-08-11 | Discharge: 2017-08-11 | Disposition: A | Discharge status: Home / Self Care | Payer: BLUE CROSS/BLUE SHIELD | Source: Ambulatory Visit | Attending: Urology | Admitting: Urology

## 2017-08-11 ENCOUNTER — Encounter: Payer: Self-pay | Admitting: Radiation Oncology

## 2017-08-11 DIAGNOSIS — C61 Malignant neoplasm of prostate: Secondary | ICD-10-CM

## 2017-08-11 MED ORDER — TECHNETIUM TC 99M MEDRONATE IV KIT
20.9000 | PACK | Freq: Once | INTRAVENOUS | Status: AC | PRN
  Administered 2017-08-11: 20.9 via INTRAVENOUS

## 2017-08-11 NOTE — Progress Notes (Addendum)
GU Location of Tumor / Histology: prostatic adenocarcinoma  If Prostate Cancer, Gleason Score is (4 + 5) and PSA is (5.31). Prostate volume: 18.26 grams  Travis Wheeler was referred by Dr. Delfina Redwood to Dr. Lovena Neighbours after routine physical in August 2018 revealed a PSA of 6.87.  Biopsies of prostate (if applicable) revealed:    Past/Anticipated interventions by urology, if any: prostate biopsy, bone scan (negative), referral to radiation oncology  Patient DENIES receiving ADT up to this point.  Past/Anticipated interventions by medical oncology, if any: no  Weight changes, if any: no  Bowel/Bladder complaints, if any: Occasional difficulty emptying his bladder, intermittency, urgency, and nocturia x 1. IPSS 8. Denies dysuria or hematuria. Reports rare urinary leakage associated with urgency.    Nausea/Vomiting, if any: no  Pain issues, if any:  Hands ache when it rains  SAFETY ISSUES:  Prior radiation? no  Pacemaker/ICD? no  Possible current pregnancy? no  Is the patient on methotrexate? no  Current Complaints / other details:  51 year old male. Single. Father with hx of colon ca. Pt has HIV.

## 2017-08-12 ENCOUNTER — Ambulatory Visit
Admission: RE | Admit: 2017-08-12 | Discharge: 2017-08-12 | Disposition: A | Discharge status: Home / Self Care | Payer: BLUE CROSS/BLUE SHIELD | Source: Ambulatory Visit | Attending: Radiation Oncology | Admitting: Radiation Oncology

## 2017-08-12 ENCOUNTER — Encounter: Payer: Self-pay | Admitting: Radiation Oncology

## 2017-08-12 ENCOUNTER — Other Ambulatory Visit: Payer: Self-pay

## 2017-08-12 DIAGNOSIS — Z8249 Family history of ischemic heart disease and other diseases of the circulatory system: Secondary | ICD-10-CM | POA: Insufficient documentation

## 2017-08-12 DIAGNOSIS — Z21 Asymptomatic human immunodeficiency virus [HIV] infection status: Secondary | ICD-10-CM | POA: Diagnosis not present

## 2017-08-12 DIAGNOSIS — Z87442 Personal history of urinary calculi: Secondary | ICD-10-CM | POA: Diagnosis not present

## 2017-08-12 DIAGNOSIS — Z79899 Other long term (current) drug therapy: Secondary | ICD-10-CM | POA: Diagnosis not present

## 2017-08-12 DIAGNOSIS — C61 Malignant neoplasm of prostate: Secondary | ICD-10-CM

## 2017-08-12 DIAGNOSIS — Z86711 Personal history of pulmonary embolism: Secondary | ICD-10-CM | POA: Diagnosis not present

## 2017-08-12 DIAGNOSIS — R972 Elevated prostate specific antigen [PSA]: Secondary | ICD-10-CM | POA: Diagnosis not present

## 2017-08-12 HISTORY — DX: Malignant neoplasm of prostate: C61

## 2017-08-12 NOTE — Progress Notes (Signed)
See progress note under physician encounter. 

## 2017-08-12 NOTE — Progress Notes (Signed)
Radiation Oncology         [phone] ________________________________  Initial outpatient Consultation  Name: Travis Wheeler MRN: [id number]  Date: 08/12/2017  DOB: Mar 07, 1966  YH:[id number], Jori Moll, MD  [facility]*   REFERRING PHYSICIAN: [facility]*  DIAGNOSIS: 51 y.o. 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 Malignant neoplasm of prostate ([place]) [facility] referral to [facility] ILLNESS::Travis Wheeler is a 51 y.o. gentleman.  He was noted to have an elevated PSA of 6.87 by his primary care physician, Dr. Delfina Redwood.  Accordingly, he was referred for evaluation in urology by Dr. Lovena Neighbours on 05/28/17,  digital rectal examination was performed at that time revealing slight firmness in the right lobe but no definite nodule.  A repeat PSA was performed on 06/18/2017 and remained elevated at 5.31. The patient proceeded to transrectal ultrasound with 12 biopsies of the prostate on 07/17/2017.  The prostate volume measured 18.26 cc.  Out of 12 core biopsies, 12 were positive.  The maximum Gleason score was 4+5, and this was seen in the right apex, right base lateral and right apex lateral. Additionally, Gleason 4+3 disease was seen in the right mid lateral and right base, Gleason 3+4 disease in the left apex, right mid gland, left base and left mid gland as well as Gleason 3+3 in the left base lateral, left mid lateral and left apex lateral. There was evidence of perineural invasion.  He underwent imaging studies for disease staging with a CT A/P and Bone scan on 08/11/2017, both of which showed no evidence of metastatic disease.  Of note, he is HIV positive and has a h/o bilateral PE in 09/2016.  He was able to discontinue his anticoagulation in 04/2017 and has not had any further issues.    The patient reviewed the biopsy results with his urologist and he has kindly been  referred today for discussion of potential radiation treatment options.   PREVIOUS RADIATION THERAPY: No  PAST MEDICAL HISTORY:  has a past medical history of HIV infection ([place]), Kidney stones, Prostate cancer ([place]), and Seasonal allergies.    PAST SURGICAL HISTORY: Past Surgical History:  Procedure Laterality Date  . PILONIDAL CYST EXCISION    . PROSTATE BIOPSY      FAMILY HISTORY: family history includes Alcoholism in his father; Cancer in his cousin, father, and maternal aunt; Diabetes in his maternal grandmother and paternal grandmother; Hypertension in his maternal grandmother, mother, and paternal grandmother.  SOCIAL HISTORY:  reports that  has never smoked. he has never used smokeless tobacco. He reports that he drinks alcohol. He reports that he does not use drugs. He continues to work second shift.  ALLERGIES: Patient has no known allergies.  MEDICATIONS:  Current Outpatient Medications  Medication Sig Dispense Refill  . bictegravir-emtricitabine-tenofovir AF (BIKTARVY) 50-200-25 MG TABS tablet Take 1 tablet by mouth daily. 90 tablet 3  . cetirizine (ZYRTEC) 10 MG tablet Take 10 mg by mouth daily.    Marland Kitchen acetaminophen (TYLENOL) 500 MG tablet Take 1,000 mg by mouth every 6 (six) hours as needed for moderate pain.    Marland Kitchen PATADAY 0.2 % SOLN Instill 1 drop into both eyes twice daily as needed for allergies  3   No current facility-administered medications for this encounter.     REVIEW OF SYSTEMS:  On review of systems, the patient reports that he  is doing well overall. He denies any chest pain, shortness of breath, cough, fevers, chills, night sweats, unintended weight changes. He denies any bowel disturbances, and denies abdominal pain, nausea or vomiting. He eats a lot of fiber and typically moves his bowels 2-3 times daily. He denies trouble with swelling of the feet and ankles. He denies any new musculoskeletal or joint aches or pains, new skin lesions or concerns. The patient  completed an IPSS and IIEF questionnaire.  His IPSS score was 8 indicating moderate urinary outflow obstructive symptoms, including occasional difficulty emptying his bladder, intermittency, urgency, and nocturia x 1. He reports rare urinary leakage associated with urgency but denies dysuria or hematuria. He reports mild ED. A complete review of systems is obtained and is otherwise negative.   PHYSICAL EXAM:    height is 5\' 11"  (1.803 m) and weight is 193 lb 6.4 oz (87.7 kg). His oral temperature is 98 F (36.7 C). His blood pressure is 138/80 and his pulse is 95. His respiration is 18 and oxygen saturation is 100%.   Pain Assessment Pain Score: 0-No pain/10  In general this is a well appearing african-american male in no acute distress. He is unaccompanied today. He is alert and oriented x4 and appropriate throughout the examination. HEENT reveals that the patient is normocephalic, atraumatic. EOMs are intact. PERRLA. Skin is intact without any evidence of gross lesions. Cardiovascular exam reveals a regular rate and rhythm, no clicks rubs or murmurs are auscultated. Chest is clear to auscultation bilaterally. Lymphatic assessment is performed and does not reveal any adenopathy in the cervical, supraclavicular, axillary, or inguinal chains. Abdomen has active bowel sounds in all quadrants and is intact. The abdomen is soft, non tender, non distended. Lower extremities are negative for pretibial pitting edema, deep calf tenderness, cyanosis or clubbing.   KPS = 100  100 - Normal; no complaints; no evidence of disease. 90   - Able to carry on normal activity; minor signs or symptoms of disease. 80   - Normal activity with effort; some signs or symptoms of disease. 8   - Cares for self; unable to carry on normal activity or to do active work. 60   - Requires occasional assistance, but is able to care for most of his personal needs. 50   - Requires considerable assistance and frequent medical  care. 25   - Disabled; requires special care and assistance. 30   - Severely disabled; hospital admission is indicated although death not imminent. 62   - Very sick; hospital admission necessary; active supportive treatment necessary. 10   - Moribund; fatal processes progressing rapidly. 0     - Dead  Karnofsky DA, Abelmann [place], Craver LS and Burchenal [facility] [phone]) The use of the nitrogen mustards in the palliative treatment of carcinoma: with particular reference to bronchogenic carcinoma Cancer 1 [phone]   LABORATORY DATA:  Lab Results  Component Value Date   WBC 4.0 03/05/2017   HGB 14.2 03/05/2017   HCT 42.0 03/05/2017   MCV 92.5 03/05/2017   PLT 241 03/05/2017   Lab Results  Component Value Date   NA 139 03/05/2017   K 3.7 03/05/2017   CL 103 03/05/2017   CO2 25 03/05/2017   Lab Results  Component Value Date   ALT 45 03/05/2017   AST 26 03/05/2017   ALKPHOS 97 03/05/2017   BILITOT 0.5 03/05/2017     RADIOGRAPHY: Nm Bone Scan Whole Body  Result Date: 08/11/2017 CLINICAL DATA:  History of  prostate malignancy. No current skeletal complaints. EXAM: NUCLEAR MEDICINE WHOLE BODY BONE SCAN TECHNIQUE: Whole body anterior and posterior images were obtained approximately 3 hours after intravenous injection of radiopharmaceutical. RADIOPHARMACEUTICALS:  20.9 mCi Technetium-12m MDP IV COMPARISON:  None in PACs FINDINGS: There is adequate uptake of the radiopharmaceutical by the skeleton. There is adequate soft tissue clearance and renal activity. There is a moderate amount of increased uptake in the nasal passages. There is increased uptake in the thyroid bed. There is mildly increased uptake associated with the sternoclavicular joints greatest on the left. Uptake within the spine and pelvis and ribs is normal. Uptake within the upper and lower extremities is normal. IMPRESSION: There is no abnormal uptake pattern to suggest metastatic disease. Electronically Signed   By: David  Martinique M.D.    On: 08/11/2017 16:11      IMPRESSION: This is a 51 y.o. 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  His Gleason's Score puts him into the high risk group.  Accordingly he is eligible for a variety of potential treatment options including LT-ADT in combination with radiotherapy in the form of IMRT alone versus seed implant followed by 5 weeks of EBRT.  PLAN: Today we reviewed the findings and workup thus far.  We discussed the natural history of prostate cancer.  We reviewed the the implications of T-stage, Gleason's Score, and PSA on decision-making and outcomes in prostate cancer.  We discussed radiation treatment in the management of prostate cancer with regard to the logistics and delivery of external beam radiation treatment as well as the logistics and delivery of prostate brachytherapy.  We compared and contrasted each of these approaches and also compared these against prostatectomy.   At the end of our conversation, the patient is most interested in proceeding with prostatectomy which we support. We will share our findings with Dr. Lovena Neighbours.  We did briefly discuss the role of adjuvant radiotherapy following prostatectomy in the setting of unfavorable pathology and/or detectable/rising PSA.  He appears to have a good understanding of his disease and treatment options.  We enjoyed meeting with him today, and are happy to continue to participate in his care if clinically indicated in the future.   We spent 60 minutes face to face with the patient and more than 50% of that time was spent in counseling and/or coordination of care.     Nicholos Johns, PA-C    Tyler Pita, MD  [facility] Direct Dial: [phone]  Fax: [phone] [email or web address]  Skype  LinkedIn  This document serves as a record of services personally performed by Tyler Pita, MD and Freeman Caldron, PA-C. It was created on their  behalf by Arlyce Harman, a trained medical scribe. The creation of this record is based on the scribe's personal observations and the provider's statements to them. This document has been checked and approved by the attending provider.

## 2017-08-21 ENCOUNTER — Telehealth: Payer: Self-pay | Admitting: Medical Oncology

## 2017-08-21 NOTE — Telephone Encounter (Signed)
Left a message to introduce myself as the prostate nurse navigator. I was unable to meet him the day he consulted with Dr. Tammi Klippel. He was interested in the robotic prostatectomy after discussion with Dr. Tammi Klippel. I asked him to return my call with surgery date or if he helps getting this scheduled.

## 2017-08-21 NOTE — Telephone Encounter (Signed)
Travis Wheeler returned my call stating that he has a follow up appointment with Dr. Gilford Rile  09/05/17 to discuss robotic prostatectomy. He plans to have in January after the holidays. I wished him well and asked him to call me if I can help him in any way. He voiced understanding.

## 2017-08-27 ENCOUNTER — Encounter: Payer: Self-pay | Admitting: General Practice

## 2017-08-27 NOTE — Progress Notes (Signed)
[place] Psychosocial Distress Screening Clinical Social Work  Clinical Social Work was referred by distress screening protocol.  The patient scored a 8 on the Psychosocial Distress Thermometer which indicates severe distress. Clinical Social Worker Manufacturing systems engineer and patient discussed common feeling and emotions when being diagnosed with cancer, and the importance of support during treatment. CSW informed patient of the support team and support services at [facility]. CSW provided contact information and encouraged patient to call with any questions or concerns. to assess for distress and other psychosocial needs. Patient states his most pressing need is help w copays, states he has no issues w transportation.  Referral made to financial counseling via staff message.  Patient mailed information packet about [facility] services.  ONCBCN DISTRESS SCREENING 08/12/2017  Screening Type Initial Screening  Distress experienced in past week (1-10) 8  Practical problem type Insurance;Transportation  Family Problem type Other (comment)  Emotional problem type Depression;Nervousness/Anxiety;Adjusting to illness;Feeling hopeless  Spiritual/Religous concerns type Relating to God;Loss of Faith;Facing my mortality;Loss of sense of purpose  Information Concerns Type Lack of info about diagnosis;Lack of info about treatment;Lack of info about complementary therapy choices  Physical Problem type Nausea/vomiting;Changes in urination  Physician notified of physical symptoms Yes  Referral to clinical psychology No  Referral to clinical social work Yes  Referral to dietition No  Referral to financial advocate Yes  Referral to support programs No  Referral to palliative care No      Clinical Social Worker follow up needed: No.  If yes, follow up plan:  Edwyna Shell, LCSW Clinical Social Worker Phone:  [phone]

## 2017-09-04 DIAGNOSIS — C61 Malignant neoplasm of prostate: Secondary | ICD-10-CM | POA: Diagnosis not present

## 2017-09-17 ENCOUNTER — Other Ambulatory Visit: Payer: Self-pay | Admitting: Urology

## 2017-09-17 ENCOUNTER — Other Ambulatory Visit: Payer: BLUE CROSS/BLUE SHIELD

## 2017-09-17 DIAGNOSIS — B20 Human immunodeficiency virus [HIV] disease: Secondary | ICD-10-CM

## 2017-09-18 LAB — T-HELPER CELL (CD4) - (RCID CLINIC ONLY)
CD4 % Helper T Cell: 22 % — ABNORMAL LOW (ref 33–55)
CD4 T CELL ABS: 430 /uL (ref 400–2700)

## 2017-09-19 LAB — HIV-1 RNA QUANT-NO REFLEX-BLD
HIV 1 RNA QUANT: NOT DETECTED {copies}/mL
HIV-1 RNA QUANT, LOG: NOT DETECTED {Log_copies}/mL

## 2017-09-25 NOTE — Patient Instructions (Addendum)
Travis Wheeler  09/25/2017   Your procedure is scheduled on: 10-02-17  Report to [facility] Main  Entrance             Take [facility]  elevators to 3rd floor to  [place] at    1100AM.    Call this number if you have problems the morning of surgery [phone]    Remember: ONLY 1 PERSON MAY GO WITH YOU TO SHORT STAY TO GET  READY MORNING OF [place].   Do not eat food  :After Midnight.  YOU MAY HAVE CLEAR LIQUIDS UNTIL 0700 AM THEN NOTHING BY MOUTH     Take these medicines the morning of surgery with A SIP OF WATER: antiviral , zyrtec                                You may not have any metal on your body including hair pins and              piercings  Do not wear jewelry,lotions, powders or perfumes, deodorant              Men may shave face and neck.   Do not bring valuables to the hospital. [place].  Contacts, dentures or bridgework may not be worn into surgery.  Leave suitcase in the car. After surgery it may be brought to your room.                Please read over the following fact sheets you were given: _____________________________________________________________________           [facility] - Preparing for Surgery Before surgery, you can play an important role.  Because skin is not sterile, your skin needs to be as free of germs as possible.  You can reduce the number of germs on your skin by washing with CHG (chlorahexidine gluconate) soap before surgery.  CHG is an antiseptic cleaner which kills germs and bonds with the skin to continue killing germs even after washing. Please DO NOT use if you have an allergy to CHG or antibacterial soaps.  If your skin becomes reddened/irritated stop using the CHG and inform your nurse when you arrive at Short Stay. Do not shave (including legs and underarms) for at least 48 hours prior to the first CHG shower.  You may shave your face/neck. Please  follow these instructions carefully:  1.  Shower with CHG Soap the night before surgery and the  morning of Surgery.  2.  If you choose to wash your hair, wash your hair first as usual with your  normal  shampoo.  3.  After you shampoo, rinse your hair and body thoroughly to remove the  shampoo.                           4.  Use CHG as you would any other liquid soap.  You can apply chg directly  to the skin and wash                       Gently with a scrungie or clean washcloth.  5.  Apply the CHG Soap to your body  ONLY FROM THE NECK DOWN.   Do not use on face/ open                           Wound or open sores. Avoid contact with eyes, ears mouth and genitals (private parts).                       Wash face,  Genitals (private parts) with your normal soap.             6.  Wash thoroughly, paying special attention to the area where your surgery  will be performed.  7.  Thoroughly rinse your body with warm water from the neck down.  8.  DO NOT shower/wash with your normal soap after using and rinsing off  the CHG Soap.                9.  Pat yourself dry with a clean towel.            10.  Wear clean pajamas.            11.  Place clean sheets on your bed the night of your first shower and do not  sleep with pets. Day of Surgery : Do not apply any lotions/deodorants the morning of surgery.  Please wear clean clothes to the hospital/surgery center.  FAILURE TO FOLLOW THESE INSTRUCTIONS MAY RESULT IN THE CANCELLATION OF YOUR SURGERY PATIENT SIGNATURE_________________________________  NURSE SIGNATURE__________________________________  ________________________________________________________________________    CLEAR LIQUID DIET   Foods Allowed                                                                     Foods Excluded  Coffee and tea, regular and decaf                             liquids that you cannot  Plain Jell-O in any flavor                                             see  through such as: Fruit ices (not with fruit pulp)                                     milk, soups, orange juice  Iced Popsicles                                    All solid food Carbonated beverages, regular and diet                                    Cranberry, grape and apple juices Sports drinks like Gatorade Lightly seasoned clear broth or consume(fat free) Sugar, honey syrup  Sample Menu Breakfast  Lunch                                     Supper Cranberry juice                    Beef broth                            Chicken broth Jell-O                                     Grape juice                           Apple juice Coffee or tea                        Jell-O                                      Popsicle                                                Coffee or tea                        Coffee or tea  _____________________________________________________________________

## 2017-09-28 ENCOUNTER — Other Ambulatory Visit: Payer: Self-pay

## 2017-09-28 ENCOUNTER — Encounter (HOSPITAL_COMMUNITY): Payer: Self-pay

## 2017-09-28 ENCOUNTER — Encounter (HOSPITAL_COMMUNITY)
Admission: RE | Admit: 2017-09-28 | Discharge: 2017-09-28 | Disposition: A | Discharge status: Home / Self Care | Payer: BLUE CROSS/BLUE SHIELD | Source: Ambulatory Visit | Attending: Urology | Admitting: Urology

## 2017-09-28 DIAGNOSIS — Z01812 Encounter for preprocedural laboratory examination: Secondary | ICD-10-CM | POA: Insufficient documentation

## 2017-09-28 DIAGNOSIS — Z01810 Encounter for preprocedural cardiovascular examination: Secondary | ICD-10-CM | POA: Diagnosis not present

## 2017-09-28 HISTORY — DX: Pneumonia, unspecified organism: J18.9

## 2017-09-28 HISTORY — DX: Personal history of urinary calculi: Z87.442

## 2017-09-28 LAB — CBC
HEMATOCRIT: 40.9 % (ref 39.0–52.0)
HEMOGLOBIN: 13.8 g/dL (ref 13.0–17.0)
MCH: 30.4 pg (ref 26.0–34.0)
MCHC: 33.7 g/dL (ref 30.0–36.0)
MCV: 90.1 fL (ref 78.0–100.0)
Platelets: 285 10*3/uL (ref 150–400)
RBC: 4.54 MIL/uL (ref 4.22–5.81)
RDW: 12.8 % (ref 11.5–15.5)
WBC: 5.8 10*3/uL (ref 4.0–10.5)

## 2017-09-28 LAB — BASIC METABOLIC PANEL
ANION GAP: 7 (ref 5–15)
BUN: 10 mg/dL (ref 6–20)
CHLORIDE: 106 mmol/L (ref 101–111)
CO2: 27 mmol/L (ref 22–32)
Calcium: 9.1 mg/dL (ref 8.9–10.3)
Creatinine, Ser: 1.33 mg/dL — ABNORMAL HIGH (ref 0.61–1.24)
Glucose, Bld: 103 mg/dL — ABNORMAL HIGH (ref 65–99)
POTASSIUM: 3.9 mmol/L (ref 3.5–5.1)
SODIUM: 140 mmol/L (ref 135–145)

## 2017-09-28 NOTE — Progress Notes (Signed)
EKG 10-04-16 Epic  CXR 10-01-16 Epic  CTA CHEST 10-01-16    ECHO  10-02-16 EPIC

## 2017-10-01 ENCOUNTER — Ambulatory Visit: Payer: BLUE CROSS/BLUE SHIELD | Admitting: Internal Medicine

## 2017-10-01 NOTE — H&P (Signed)
Urology Preoperative H&P   Chief Complaint: Prostate cancer  History of Present Illness: Travis Wheeler is a 52 y.o. 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 Subsequent staging CT and bone scan were negative for signs of metastatic disease. He met with Dr. Tammi Klippel to discuss radiotherapeutic options for treatment of his prostate cancer. After considering all of his options from a surgical and radiation standpoint, the patient will like to proceed with radical prostatectomy for treatment of his prostate cancer. He reports no changes in his voiding symptoms or erectile function.   AUASS-11 and 3  SHIM-18.  The patient states that erections are not a priority to him at this point.    Past Medical History:  Diagnosis Date  . History of kidney stones   . HIV infection ([place])   . Pneumonia   . Prostate cancer ([place])   . Seasonal allergies     Past Surgical History:  Procedure Laterality Date  . EXAMINATION UNDER ANESTHESIA N/A 03/17/2014   Procedure: EXAM UNDER ANESTHESIA AND EXCISION OF ANAL MASS;  Surgeon: Ralene Ok, MD;  Location: [place];  Service: General;  Laterality: N/A;  . PILONIDAL CYST EXCISION    . PROSTATE BIOPSY      Allergies: No Known Allergies  Family History  Problem Relation Age of Onset  . Hypertension Mother   . Cancer Father        Colorectal  . Alcoholism Father   . Diabetes Maternal Grandmother   . Hypertension Maternal Grandmother   . Diabetes Paternal Grandmother   . Hypertension Paternal Grandmother   . Cancer Maternal Aunt        great aunt/breast ca/mastectomy  . Cancer Cousin        second cousin/unknown type    Social History:  reports that  has never smoked. he has never used smokeless tobacco. He reports that he drinks alcohol. He reports that he does not use drugs.  ROS: A complete review of systems was performed.  All systems are negative except for pertinent findings as  noted.  Physical Exam:  Vital signs in last 24 hours:   Constitutional:  Alert and oriented, No acute distress Cardiovascular: Regular rate and rhythm, No JVD Respiratory: Normal respiratory effort, Lungs clear bilaterally GI: Abdomen is soft, nontender, nondistended, no abdominal masses GU: No CVA tenderness Lymphatic: No lymphadenopathy Neurologic: Grossly intact, no focal deficits Psychiatric: Normal mood and affect  Laboratory Data:  No results for input(s): WBC, HGB, HCT, PLT in the last 72 hours.  No results for input(s): NA, K, CL, GLUCOSE, BUN, CALCIUM, CREATININE in the last 72 hours.  Invalid input(s): CO3   No results found for this or any previous visit (from the past 24 hour(s)). No results found for this or any previous visit (from the past 240 hour(s)).  Renal Function: Recent Labs    09/28/17 1445  CREATININE 1.33*   Estimated Creatinine Clearance: 70 mL/min (A) (by C-G formula based on SCr of 1.33 mg/dL (H)).  Radiologic Imaging: No results found.  I independently reviewed the above imaging studies.  Assessment and Plan Travis Wheeler is a 52 y.o. 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 It was explained to him how his age and life expectancy, clinical stage, Gleason score, and PSA affect his prognosis,  the decision to proceed with additional staging studies, as well as how that information influences recommended treatment strategies. We discussed the roles for active surveillance, radiation therapy, surgical therapy, androgen deprivation, as well as ablative therapy options for the treatment of prostate cancer as appropriate to his individual cancer situation. We discussed the risks and benefits of these options with regard to their impact on cancer control and also in terms of potential adverse  events, complications, and impact on quality of life particularly related to urinary and sexual function. The patient was encouraged to ask questions throughout the discussion today and all questions were answered to his stated satisfaction. In addition, the patient was provided with and/or directed to appropriate resources and literature for further education about prostate cancer and treatment options.   We discussed surgical therapy for prostate cancer including the different available surgical approaches. We discussed, in detail, the risks and expectations of surgery with regard to cancer control, urinary control, and erectile function as well as the expected postoperative recovery process. Additional risks of surgery including but not limited to bleeding, infection, hernia formation, nerve damage, lymphocele formation, bowel/rectal injury potentially necessitating colostomy, damage to the urinary tract resulting in urine leakage, urethral stricture, and the cardiopulmonary risks such as myocardial infarction, stroke, death, venothromboembolism, etc. were explained. The risk of open surgical conversion for robotic/laparoscopic prostatectomy was also discussed.   After discussing the risks, benefits and alternatives the patient has elected to undergo robotic assisted laparoscopic prostatectomy (non-nerve sparing) with bilateral pelvic lymph node dissection.  Ellison Hughs, MD 10/01/2017, 4:52 PM  [facility] Pager: [phone]

## 2017-10-02 ENCOUNTER — Other Ambulatory Visit: Payer: Self-pay

## 2017-10-02 ENCOUNTER — Observation Stay (HOSPITAL_COMMUNITY)
Admission: RE | Admit: 2017-10-02 | Discharge: 2017-10-03 | Disposition: A | Discharge status: Home / Self Care | Payer: BLUE CROSS/BLUE SHIELD | Source: Ambulatory Visit | Attending: Urology | Admitting: Urology

## 2017-10-02 ENCOUNTER — Ambulatory Visit (HOSPITAL_COMMUNITY): Payer: BLUE CROSS/BLUE SHIELD | Admitting: Anesthesiology

## 2017-10-02 ENCOUNTER — Encounter (HOSPITAL_COMMUNITY)
Admission: RE | Disposition: A | Discharge status: Home / Self Care | Payer: Self-pay | Source: Ambulatory Visit | Attending: Urology

## 2017-10-02 ENCOUNTER — Encounter (HOSPITAL_COMMUNITY): Payer: Self-pay | Admitting: *Deleted

## 2017-10-02 DIAGNOSIS — C61 Malignant neoplasm of prostate: Principal | ICD-10-CM | POA: Diagnosis present

## 2017-10-02 DIAGNOSIS — I2699 Other pulmonary embolism without acute cor pulmonale: Secondary | ICD-10-CM | POA: Diagnosis not present

## 2017-10-02 DIAGNOSIS — Z87442 Personal history of urinary calculi: Secondary | ICD-10-CM | POA: Insufficient documentation

## 2017-10-02 DIAGNOSIS — B20 Human immunodeficiency virus [HIV] disease: Secondary | ICD-10-CM | POA: Diagnosis not present

## 2017-10-02 DIAGNOSIS — Z79899 Other long term (current) drug therapy: Secondary | ICD-10-CM | POA: Insufficient documentation

## 2017-10-02 HISTORY — PX: ROBOT ASSISTED LAPAROSCOPIC RADICAL PROSTATECTOMY: SHX5141

## 2017-10-02 HISTORY — PX: LYMPH NODE DISSECTION: SHX5087

## 2017-10-02 LAB — CBC
HCT: 39.8 % (ref 39.0–52.0)
Hemoglobin: 13.6 g/dL (ref 13.0–17.0)
MCH: 30.8 pg (ref 26.0–34.0)
MCHC: 34.2 g/dL (ref 30.0–36.0)
MCV: 90 fL (ref 78.0–100.0)
PLATELETS: 274 10*3/uL (ref 150–400)
RBC: 4.42 MIL/uL (ref 4.22–5.81)
RDW: 12.8 % (ref 11.5–15.5)
WBC: 16.5 10*3/uL — ABNORMAL HIGH (ref 4.0–10.5)

## 2017-10-02 LAB — CREATININE, SERUM
CREATININE: 1.21 mg/dL (ref 0.61–1.24)
GFR calc Af Amer: >60 mL/min (ref >60)
GFR calc non Af Amer: >60 mL/min (ref >60)

## 2017-10-02 SURGERY — XI ROBOTIC ASSISTED LAPAROSCOPIC RADICAL PROSTATECTOMY LEVEL 2
Anesthesia: General

## 2017-10-02 MED ORDER — HYDROMORPHONE HCL 1 MG/ML IJ SOLN
INTRAMUSCULAR | Status: AC
  Administered 2017-10-02: 0.5 mg via INTRAVENOUS
  Filled 2017-10-02: qty 1

## 2017-10-02 MED ORDER — DIPHENHYDRAMINE HCL 50 MG/ML IJ SOLN: 12.5000 mg | Freq: Four times a day (QID) | INTRAMUSCULAR | Status: DC | PRN

## 2017-10-02 MED ORDER — PHENYLEPHRINE 40 MCG/ML (10ML) SYRINGE FOR IV PUSH (FOR BLOOD PRESSURE SUPPORT)
PREFILLED_SYRINGE | INTRAVENOUS | Status: DC | PRN
  Administered 2017-10-02: 80 ug via INTRAVENOUS
  Administered 2017-10-02: 40 ug via INTRAVENOUS
  Administered 2017-10-02: 80 ug via INTRAVENOUS

## 2017-10-02 MED ORDER — ESMOLOL HCL 100 MG/10ML IV SOLN
INTRAVENOUS | Status: DC | PRN
  Administered 2017-10-02: 30 mg via INTRAVENOUS

## 2017-10-02 MED ORDER — HEPARIN SODIUM (PORCINE) 5000 UNIT/ML IJ SOLN
5000.0000 [IU] | Freq: Three times a day (TID) | INTRAMUSCULAR | Status: DC
  Administered 2017-10-02 – 2017-10-03 (×2): 5000 [IU] via SUBCUTANEOUS
  Filled 2017-10-02: qty 1

## 2017-10-02 MED ORDER — ONDANSETRON HCL 4 MG/2ML IJ SOLN: 4.0000 mg | Freq: Four times a day (QID) | INTRAMUSCULAR | Status: DC | PRN

## 2017-10-02 MED ORDER — LACTATED RINGERS IV SOLN
INTRAVENOUS | Status: DC
  Administered 2017-10-02 (×2): via INTRAVENOUS

## 2017-10-02 MED ORDER — DIPHENHYDRAMINE HCL 12.5 MG/5ML PO ELIX: 12.5000 mg | ORAL_SOLUTION | Freq: Four times a day (QID) | ORAL | Status: DC | PRN

## 2017-10-02 MED ORDER — OXYCODONE HCL 5 MG/5ML PO SOLN
5.0000 mg | Freq: Once | ORAL | Status: DC | PRN
  Filled 2017-10-02: qty 5

## 2017-10-02 MED ORDER — PHENYLEPHRINE 40 MCG/ML (10ML) SYRINGE FOR IV PUSH (FOR BLOOD PRESSURE SUPPORT)
PREFILLED_SYRINGE | INTRAVENOUS | Status: AC
  Filled 2017-10-02: qty 10

## 2017-10-02 MED ORDER — ROCURONIUM BROMIDE 50 MG/5ML IV SOSY
PREFILLED_SYRINGE | INTRAVENOUS | Status: AC
  Filled 2017-10-02: qty 5

## 2017-10-02 MED ORDER — PROPOFOL 10 MG/ML IV BOLUS
INTRAVENOUS | Status: AC
  Filled 2017-10-02: qty 20

## 2017-10-02 MED ORDER — ACETAMINOPHEN 500 MG PO TABS
1000.0000 mg | ORAL_TABLET | Freq: Four times a day (QID) | ORAL | Status: DC
  Administered 2017-10-02 – 2017-10-03 (×3): 1000 mg via ORAL
  Filled 2017-10-02 (×3): qty 2

## 2017-10-02 MED ORDER — HYDROMORPHONE HCL 1 MG/ML IJ SOLN
0.2500 mg | INTRAMUSCULAR | Status: DC | PRN
  Administered 2017-10-02 (×2): 0.25 mg via INTRAVENOUS
  Administered 2017-10-02: 0.5 mg via INTRAVENOUS

## 2017-10-02 MED ORDER — ROCURONIUM BROMIDE 10 MG/ML (PF) SYRINGE
PREFILLED_SYRINGE | INTRAVENOUS | Status: DC | PRN
  Administered 2017-10-02 (×4): 10 mg via INTRAVENOUS
  Administered 2017-10-02: 50 mg via INTRAVENOUS
  Administered 2017-10-02: 10 mg via INTRAVENOUS
  Administered 2017-10-02: 20 mg via INTRAVENOUS

## 2017-10-02 MED ORDER — OXYCODONE HCL 5 MG PO TABS: 5.0000 mg | ORAL_TABLET | Freq: Once | ORAL | Status: DC | PRN

## 2017-10-02 MED ORDER — DEXAMETHASONE SODIUM PHOSPHATE 10 MG/ML IJ SOLN
INTRAMUSCULAR | Status: DC | PRN
  Administered 2017-10-02: 10 mg via INTRAVENOUS

## 2017-10-02 MED ORDER — DEXAMETHASONE SODIUM PHOSPHATE 10 MG/ML IJ SOLN
INTRAMUSCULAR | Status: AC
  Filled 2017-10-02: qty 1

## 2017-10-02 MED ORDER — BICTEGRAVIR-EMTRICITAB-TENOFOV 50-200-25 MG PO TABS
1.0000 | ORAL_TABLET | Freq: Every day | ORAL | Status: DC
  Administered 2017-10-03: 1 via ORAL
  Filled 2017-10-02: qty 1

## 2017-10-02 MED ORDER — PROPOFOL 10 MG/ML IV BOLUS
INTRAVENOUS | Status: DC | PRN
  Administered 2017-10-02: 200 mg via INTRAVENOUS

## 2017-10-02 MED ORDER — ONDANSETRON HCL 4 MG/2ML IJ SOLN
INTRAMUSCULAR | Status: DC | PRN
  Administered 2017-10-02: 4 mg via INTRAVENOUS

## 2017-10-02 MED ORDER — LACTATED RINGERS IR SOLN
Status: DC | PRN
  Administered 2017-10-02: 1000 mL

## 2017-10-02 MED ORDER — ONDANSETRON HCL 4 MG/2ML IJ SOLN
INTRAMUSCULAR | Status: AC
  Filled 2017-10-02: qty 2

## 2017-10-02 MED ORDER — CEFAZOLIN SODIUM-DEXTROSE 2-4 GM/100ML-% IV SOLN
2.0000 g | Freq: Once | INTRAVENOUS | Status: AC
  Administered 2017-10-02: 2 g via INTRAVENOUS
  Filled 2017-10-02: qty 100

## 2017-10-02 MED ORDER — ESMOLOL HCL 100 MG/10ML IV SOLN
INTRAVENOUS | Status: AC
  Filled 2017-10-02: qty 10

## 2017-10-02 MED ORDER — HYDROCODONE-ACETAMINOPHEN 5-325 MG PO TABS: 1.0000 | ORAL_TABLET | Freq: Four times a day (QID) | ORAL | 0 refills | Status: DC | PRN

## 2017-10-02 MED ORDER — MIDAZOLAM HCL 2 MG/2ML IJ SOLN
INTRAMUSCULAR | Status: AC
  Filled 2017-10-02: qty 2

## 2017-10-02 MED ORDER — FENTANYL CITRATE (PF) 250 MCG/5ML IJ SOLN
INTRAMUSCULAR | Status: AC
  Filled 2017-10-02: qty 5

## 2017-10-02 MED ORDER — HYDROMORPHONE HCL 1 MG/ML IJ SOLN: 0.5000 mg | INTRAMUSCULAR | Status: DC | PRN

## 2017-10-02 MED ORDER — SULFAMETHOXAZOLE-TRIMETHOPRIM 800-160 MG PO TABS: 1.0000 | ORAL_TABLET | Freq: Two times a day (BID) | ORAL | 0 refills | Status: DC

## 2017-10-02 MED ORDER — ONDANSETRON HCL 4 MG/2ML IJ SOLN: 4.0000 mg | INTRAMUSCULAR | Status: DC | PRN

## 2017-10-02 MED ORDER — DOCUSATE SODIUM 100 MG PO CAPS: 100.0000 mg | ORAL_CAPSULE | Freq: Two times a day (BID) | ORAL | 0 refills | Status: DC

## 2017-10-02 MED ORDER — KETAMINE HCL 10 MG/ML IJ SOLN
INTRAMUSCULAR | Status: DC | PRN
  Administered 2017-10-02: 35 mg via INTRAVENOUS

## 2017-10-02 MED ORDER — MIDAZOLAM HCL 5 MG/5ML IJ SOLN
INTRAMUSCULAR | Status: DC | PRN
  Administered 2017-10-02: 2 mg via INTRAVENOUS

## 2017-10-02 MED ORDER — LIDOCAINE 2% (20 MG/ML) 5 ML SYRINGE
INTRAMUSCULAR | Status: AC
  Filled 2017-10-02: qty 5

## 2017-10-02 MED ORDER — DEXTROSE-NACL 5-0.45 % IV SOLN
INTRAVENOUS | Status: DC
  Administered 2017-10-02 – 2017-10-03 (×2): via INTRAVENOUS

## 2017-10-02 MED ORDER — PHENAZOPYRIDINE HCL 100 MG PO TABS: 100.0000 mg | ORAL_TABLET | Freq: Three times a day (TID) | ORAL | 0 refills | Status: DC | PRN

## 2017-10-02 MED ORDER — BUPIVACAINE HCL (PF) 0.25 % IJ SOLN
INTRAMUSCULAR | Status: AC
  Filled 2017-10-02: qty 30

## 2017-10-02 MED ORDER — OXYCODONE HCL 5 MG PO TABS: 5.0000 mg | ORAL_TABLET | ORAL | Status: DC | PRN

## 2017-10-02 MED ORDER — BUPIVACAINE HCL (PF) 0.25 % IJ SOLN
INTRAMUSCULAR | Status: DC | PRN
  Administered 2017-10-02: 30 mL

## 2017-10-02 MED ORDER — SUGAMMADEX SODIUM 200 MG/2ML IV SOLN
INTRAVENOUS | Status: DC | PRN
  Administered 2017-10-02: 200 mg via INTRAVENOUS

## 2017-10-02 MED ORDER — SODIUM CHLORIDE 0.9 % IV BOLUS (SEPSIS)
1000.0000 mL | Freq: Once | INTRAVENOUS | Status: AC
  Administered 2017-10-02: 1000 mL via INTRAVENOUS

## 2017-10-02 MED ORDER — FENTANYL CITRATE (PF) 100 MCG/2ML IJ SOLN
INTRAMUSCULAR | Status: DC | PRN
  Administered 2017-10-02: 100 ug via INTRAVENOUS
  Administered 2017-10-02 (×7): 50 ug via INTRAVENOUS

## 2017-10-02 MED ORDER — SUGAMMADEX SODIUM 200 MG/2ML IV SOLN
INTRAVENOUS | Status: AC
  Filled 2017-10-02: qty 2

## 2017-10-02 SURGICAL SUPPLY — 64 items
APPLICATOR COTTON TIP 6IN STRL (MISCELLANEOUS) ×3 IMPLANT
CATH FOLEY 2WAY SLVR  5CC 20FR (CATHETERS) ×1
CATH FOLEY 2WAY SLVR 18FR 30CC (CATHETERS) IMPLANT
CATH FOLEY 2WAY SLVR 5CC 20FR (CATHETERS) ×2 IMPLANT
CATH FOLEY 3WAY 30CC 22FR (CATHETERS) IMPLANT
CATH ROBINSON RED A/P 8FR (CATHETERS) ×3 IMPLANT
CATH TIEMANN FOLEY 18FR 5CC (CATHETERS) IMPLANT
CHLORAPREP W/TINT 26ML (MISCELLANEOUS) ×6 IMPLANT
CLIP VESOLOCK LG 6/CT PURPLE (CLIP) IMPLANT
CLOTH BEACON ORANGE TIMEOUT ST (SAFETY) ×3 IMPLANT
COVER SURGICAL LIGHT HANDLE (MISCELLANEOUS) ×3 IMPLANT
COVER TIP SHEARS 8 DVNC (MISCELLANEOUS) ×2 IMPLANT
COVER TIP SHEARS 8MM DA VINCI (MISCELLANEOUS) ×1
CUTTER ECHEON FLEX ENDO 45 340 (ENDOMECHANICALS) ×3 IMPLANT
DECANTER SPIKE VIAL GLASS SM (MISCELLANEOUS) IMPLANT
DERMABOND ADVANCED (GAUZE/BANDAGES/DRESSINGS) ×1
DERMABOND ADVANCED .7 DNX12 (GAUZE/BANDAGES/DRESSINGS) ×2 IMPLANT
DRAPE ARM DVNC X/XI (DISPOSABLE) ×8 IMPLANT
DRAPE COLUMN DVNC XI (DISPOSABLE) ×2 IMPLANT
DRAPE DA VINCI XI ARM (DISPOSABLE) ×4
DRAPE DA VINCI XI COLUMN (DISPOSABLE) ×1
DRSG TEGADERM 4X4.75 (GAUZE/BANDAGES/DRESSINGS) ×3 IMPLANT
ELECT REM PT RETURN 15FT ADLT (MISCELLANEOUS) ×3 IMPLANT
GAUZE SPONGE 2X2 8PLY STRL LF (GAUZE/BANDAGES/DRESSINGS) IMPLANT
GLOVE BIO SURGEON STRL SZ 6.5 (GLOVE) ×3 IMPLANT
GLOVE BIOGEL M STRL SZ7.5 (GLOVE) ×6 IMPLANT
GLOVE BIOGEL PI IND STRL 7.5 (GLOVE) ×20 IMPLANT
GLOVE BIOGEL PI INDICATOR 7.5 (GLOVE) ×10
GOWN STRL REUS W/TWL LRG LVL3 (GOWN DISPOSABLE) ×18 IMPLANT
HEMOSTAT SURGICEL 4X8 (HEMOSTASIS) ×3 IMPLANT
HOLDER FOLEY CATH W/STRAP (MISCELLANEOUS) ×3 IMPLANT
IRRIG SUCT STRYKERFLOW 2 WTIP (MISCELLANEOUS) ×3
IRRIGATION SUCT STRKRFLW 2 WTP (MISCELLANEOUS) ×2 IMPLANT
IV LACTATED RINGERS 1000ML (IV SOLUTION) ×3 IMPLANT
NEEDLE INSUFFLATION 14GA 120MM (NEEDLE) ×3 IMPLANT
PACK ROBOT UROLOGY CUSTOM (CUSTOM PROCEDURE TRAY) ×3 IMPLANT
PAD POSITIONING PINK XL (MISCELLANEOUS) ×3 IMPLANT
PORT ACCESS TROCAR AIRSEAL 12 (TROCAR) ×2 IMPLANT
PORT ACCESS TROCAR AIRSEAL 5M (TROCAR) ×1
SEAL CANN UNIV 5-8 DVNC XI (MISCELLANEOUS) ×8 IMPLANT
SEAL XI 5MM-8MM UNIVERSAL (MISCELLANEOUS) ×4
SET TRI-LUMEN FLTR TB AIRSEAL (TUBING) ×3 IMPLANT
SHEARS HARMONIC ACE PLUS 45CM (MISCELLANEOUS) ×3 IMPLANT
SHEET LAVH (DRAPES) ×3 IMPLANT
SOLUTION ELECTROLUBE (MISCELLANEOUS) ×3 IMPLANT
SPONGE GAUZE 2X2 STER 10/PKG (GAUZE/BANDAGES/DRESSINGS)
SPONGE LAP 4X18 X RAY DECT (DISPOSABLE) IMPLANT
STAPLE RELOAD 45 GRN (STAPLE) IMPLANT
STAPLE RELOAD 45MM GREEN (STAPLE)
SUT MNCRL 3 0 RB1 (SUTURE) ×2 IMPLANT
SUT MNCRL 3 0 VIOLET RB1 (SUTURE) ×2 IMPLANT
SUT MNCRL AB 4-0 PS2 18 (SUTURE) ×6 IMPLANT
SUT MONO 2 0 UR5 27IN (SUTURE) ×6 IMPLANT
SUT MONOCRYL 3 0 RB1 (SUTURE) ×2
SUT V-LOC BARB 180 2/0GR6 GS22 (SUTURE)
SUT VIC AB 0 CT1 27 (SUTURE) ×1
SUT VIC AB 0 CT1 27XBRD ANTBC (SUTURE) ×2 IMPLANT
SUT VIC AB 0 CT2 27 (SUTURE) ×3 IMPLANT
SUT VICRYL 0 UR6 27IN ABS (SUTURE) ×3 IMPLANT
SUTURE V-LC BRB 180 2/0GR6GS22 (SUTURE) IMPLANT
TOWEL OR 17X26 10 PK STRL BLUE (TOWEL DISPOSABLE) ×3 IMPLANT
TOWEL OR NON WOVEN STRL DISP B (DISPOSABLE) ×3 IMPLANT
TROCAR BLADELESS OPT 5 100 (ENDOMECHANICALS) ×3 IMPLANT
WATER STERILE IRR 1000ML POUR (IV SOLUTION) ×6 IMPLANT

## 2017-10-02 NOTE — Op Note (Addendum)
Operative Note  Preoperative diagnosis:  1.  Gleason 9 Prostate Cancer  Postoperative diagnosis: 1.  Gleason 9 Prostate Cancer  Procedure(s): 1.  Robotic assisted laparoscopic prostatectomy with bilateral pelvic lymph node dissection  Surgeon: Ellison Hughs, MD  Assistants:   1.  Debbrah Alar, NP 2.  Jonna Clark, MD (resident)  Anesthesia:  General endotracheal  Complications:  None  EBL:  100 mL   Specimens: 1.  Periprostatic fat 2.  Bilateral pelvic lymph nodes 3.  Prostate and bilateral seminal vesicles   Drains/Catheters: 1.  20 French Foley catheter with 20 mL in the balloon 2.  LLQ JP drain  Intraoperative findings:  Water tight vesicourethral anastomosis   Indication:  Travis Wheeler is a 52 y.o. male with biopsy proven high volume Gleason 9 prostate cancer with a negative staging CT and bone scan pre-operatively.  The risks, benefits and alternatives of the aforementioned procedures was discussed with the patient pre-operatively.  He voices understanding and wishes to proceed.  Description of procedure:  After informed consent was obtained and signed, the patient was brought back to the operating room and placed on the operating room table in the supine position. General endotracheal anesthesia was induced.  The patient appropriately padded and was converted to lithotomy position. His abdomen, genitals, and perineum were widely prepped and draped into a sterile field. A 20 French Foley catheter was then placed sterilely with drainage of clear urine observed.  A suprapubic 8 mm horizontal incision was made. The Veress needle was then inserted into the peritoneal cavity.  Saline drop test of the Veress needle revealed no signs of obstruction and aspiration of the needle revealed no blood or sucus.  The abdomen was then insufflated with CO2 gas to a maximum pressure of 15 mmHg. The Veress needle was removed, and an 8 mm trocar was placed atraumatically.  The  robotic camera was used to inspect the sight of entry into the abdominal cavity and revealed no signs of adjacent organ or vessel injury.  Under direct laparoscopic vision, three additional robotic 8 mm trochars and one 12 mm trocar was placed at or below the level of the umbilicus, in a standard transabdominal  configuration. The patient was then placed in steep Trendelenburg position and the robot was docked.  The patient's left colon was moderately adherent to the lateral sidewall. This was taken down with a combination of sharp and blunt dissection.  The urachus was identified, grasped, and incised using monopolar cautery. The space of Retzius was developed spanning distally to the pubic bone and laterally to the vas deferens. The areolar fibrofatty tissue involving the prostate gland was taken down and the prostate was completely defatted. The endopelvic fascia was opened on the patient's right, then left side from the base of the prostate to the apex. This dissection created an apical notch to aid in identification of the dorsal venous comlex.  A 0 Vicryl suture on a CT-2 needle was then placed in a figure-of-eight fashion around the dorsal venous complex and tied down.  The right external iliac vein was identified and a standard lymph node dissection was performed spanning distally to Cloquet's node, proximally to the iliac bifurcation, laterally to the pelvic sidewall and posteriorly beyond the level of the obturator nerve. The proximal and distal extent of the pelvic lymph node packet was controlled using Hem-o-lok clips. The specimen was then removed from the abdomen through the assistant port and sent for permanent pathologic evaluation. The left side lymph node  dissection was performed in a similar fashion with its limits as described above.  The anterior bladder neck was identified, and incised using monopolar cautery. The Foley catheter was then encountered, deflated, and brought up to the  anterior abdominal wall using the accessory robotic arm. The lateral and posterior bladder neck fibers were then taken down creating a 20 French bladder neck. The trigone was inspected and both ureteral orifices were identified. Both orifices were well away from the plane of dissection. The dissection was then carried down posteriorly until the seminal vesicles and vas deferens were identified. These were sequentially dissected using a combination of sharp and blunt dissection with judicious pinpoint electrocautery. The plane involving Denonvilliers fascia was dissected posteriorly, and the perirectal fat was identified. The rectum was swept away from the underside of the prostate gland. The lateral bladder neck fibers and the prostate pedicles were then divided between harmonic scalpel.   Attention was then turned to the apical dissection. The dorsal venous complex was divided using monopolar cautery. The urethra was skeletonized circumferentially and incised over its anterior border using cold scissors. The catheter was brought back to the cut edge of the urethra and the posterior urethra was transected using cold scissors. The prostate was placed in a laparoscopic Endo Catch bag, and positioned in the upper abdomen for later retrieval.  The prostate fossa was then irrigated, suctioned, and inspected for hemostasis. Close inspection of the rectum did not demonstrate a rectal injury and hemostasis was achieved.   The bladder neck was then identified and a 3-0 Monocryl suture on an RB-1 needle was placed in an outside-to-in fashion at the 6 o'clock position of the bladder neck. A standard vesicourethral anastomosis was then run from the 6 o'clock position to the 12 o'clock position, first on the patient's left and then on his right. A new 87 French Foley catheter was placed under direct vision into the patient's bladder and the 2 ends of the suture were tied at the 12 o'clock position. 20 mL of sterile water  were placed in the Foley catheter balloon and the catheter was irrigated with sterile water. There is no evidence of an anastomotic leak or gross hematuria. The robot was undocked.  The Endo Catch bag string was then brought out of the midline abdominal port and a JP drain was placed in the left lower quadrant port into the dependent portion of the pelvis. It was secured to the skin using a 3-0 nylon suture. All ports were then removed under direct visualization. The midline rectus was then divided in a horizontal fashion to accommodate the specimen extraction. The specimen was removed from the patient's abdomen and sent for permanent pathologic evaluation. The rectus fascia was then closed using a 0 -Vicryl in a running fashion. Likewise, the 12 mm trocar site in the right lower quadrant was also closed in a figure-of-eight fashion with a 0 Vicryl. skin incisions were closed using 4-0 Monocryl , and sterile dermabond was applied. The patient was converted from lithotomy to a supine position, awakened, extubated and taken to the recovery room. He tolerated the procedure well.  Plan: Monitor the patient on the floor overnight.

## 2017-10-02 NOTE — Interval H&P Note (Signed)
History and Physical Interval Note:  10/02/2017 12:08 PM  Travis Wheeler  has presented today for surgery, with the diagnosis of PROSTATE CANCER  The various methods of treatment have been discussed with the patient and family. After consideration of risks, benefits and other options for treatment, the patient has consented to  Procedure(s): XI ROBOTIC ASSISTED LAPAROSCOPIC  PROSTATECTOMY (N/A) BILATERAL PELVIC LYMPH NODE DISSECTION (Bilateral) as a surgical intervention .  The patient's history has been reviewed, patient examined, no change in status, stable for surgery.  I have reviewed the patient's chart and labs.  Questions were answered to the patient's satisfaction.     Travis Wheeler

## 2017-10-02 NOTE — Anesthesia Procedure Notes (Signed)
Procedure Name: Intubation Date/Time: 10/02/2017 1:20 PM Performed by: Alaia Lordi D, CRNA Pre-anesthesia Checklist: Patient identified, Emergency Drugs available, Suction available and Patient being monitored Patient Re-evaluated:Patient Re-evaluated prior to induction Oxygen Delivery Method: Circle system utilized Preoxygenation: Pre-oxygenation with 100% oxygen Induction Type: IV induction Ventilation: Mask ventilation without difficulty Laryngoscope Size: Mac and 4 Grade View: Grade III Tube type: Oral Tube size: 7.5 mm Number of attempts: 1 Airway Equipment and Method: Stylet Placement Confirmation: ETT inserted through vocal cords under direct vision,  positive ETCO2 and breath sounds checked- equal and bilateral Secured at: 22 cm Tube secured with: Tape Dental Injury: Teeth and Oropharynx as per pre-operative assessment

## 2017-10-02 NOTE — Discharge Instructions (Signed)

## 2017-10-02 NOTE — Transfer of Care (Signed)
Immediate Anesthesia Transfer of Care Note  Patient: Travis Wheeler  Procedure(s) Performed: XI ROBOTIC ASSISTED LAPAROSCOPIC  PROSTATECTOMY (N/A ) BILATERAL PELVIC LYMPH NODE DISSECTION (Bilateral )  Patient Location: PACU  Anesthesia Type:General  Level of Consciousness: awake, alert  and oriented  Airway & Oxygen Therapy: Patient Spontanous Breathing and Patient connected to face mask oxygen  Post-op Assessment: Report given to RN and Post -op Vital signs reviewed and stable  Post vital signs: Reviewed and stable  Last Vitals:  Vitals:   09/28/17 1501 10/02/17 1052  BP: (!) 110/97 (!) 147/76  Pulse: 94 93  Resp: 16 18  Temp: 37.3 C 37.1 C  SpO2: 98% 99%    Last Pain:  Vitals:   10/02/17 1052  TempSrc: Oral      Patients Stated Pain Goal: 4 (53/66/44 0347)  Complications: No apparent anesthesia complications

## 2017-10-02 NOTE — Anesthesia Preprocedure Evaluation (Signed)
Anesthesia Evaluation  Patient identified by MRN, date of birth, ID band Patient awake    Reviewed: Allergy & Precautions, H&P , NPO status , Patient's Chart, lab work & pertinent test results  Airway Mallampati: II   Neck ROM: full    Dental   Pulmonary neg pulmonary ROS,    breath sounds clear to auscultation       Cardiovascular negative cardio ROS   Rhythm:regular Rate:Normal     Neuro/Psych    GI/Hepatic   Endo/Other    Renal/GU stones   Prostate CA    Musculoskeletal   Abdominal   Peds  Hematology  (+) HIV,   Anesthesia Other Findings   Reproductive/Obstetrics                             Anesthesia Physical Anesthesia Plan  ASA: II  Anesthesia Plan: General   Post-op Pain Management:    Induction: Intravenous  PONV Risk Score and Plan: 2 and Ondansetron, Dexamethasone, Midazolam and Treatment may vary due to age or medical condition  Airway Management Planned: Oral ETT  Additional Equipment:   Intra-op Plan:   Post-operative Plan: Extubation in OR  Informed Consent: I have reviewed the patients History and Physical, chart, labs and discussed the procedure including the risks, benefits and alternatives for the proposed anesthesia with the patient or authorized representative who has indicated his/her understanding and acceptance.     Plan Discussed with: CRNA, Anesthesiologist and Surgeon  Anesthesia Plan Comments:         Anesthesia Quick Evaluation

## 2017-10-02 NOTE — Progress Notes (Signed)
Patient ambulated on hall way at this time, tolerated well. 

## 2017-10-03 DIAGNOSIS — Z87442 Personal history of urinary calculi: Secondary | ICD-10-CM | POA: Diagnosis not present

## 2017-10-03 DIAGNOSIS — Z79899 Other long term (current) drug therapy: Secondary | ICD-10-CM | POA: Diagnosis not present

## 2017-10-03 DIAGNOSIS — C61 Malignant neoplasm of prostate: Secondary | ICD-10-CM | POA: Diagnosis not present

## 2017-10-03 LAB — BASIC METABOLIC PANEL
Anion gap: 6 (ref 5–15)
BUN: 9 mg/dL (ref 6–20)
CO2: 25 mmol/L (ref 22–32)
Calcium: 8.9 mg/dL (ref 8.9–10.3)
Chloride: 108 mmol/L (ref 101–111)
Creatinine, Ser: 1 mg/dL (ref 0.61–1.24)
GFR calc Af Amer: >60 mL/min (ref >60)
Glucose, Bld: 129 mg/dL — ABNORMAL HIGH (ref 65–99)
POTASSIUM: 4.2 mmol/L (ref 3.5–5.1)
Sodium: 139 mmol/L (ref 135–145)

## 2017-10-03 LAB — HEMOGLOBIN AND HEMATOCRIT, BLOOD
HCT: 40.4 % (ref 39.0–52.0)
Hemoglobin: 13.9 g/dL (ref 13.0–17.0)

## 2017-10-03 NOTE — Progress Notes (Signed)
Patient given standard drainage bag and 2 leg bags for home. Educated on catheter care, patient verbalized and demonstrated understanding.

## 2017-10-03 NOTE — Progress Notes (Signed)
Patient ambulated on hall way at this time, tolerated well. 

## 2017-10-03 NOTE — Progress Notes (Signed)
1 Day Post-Op Subjective: No acute events overnight.  Pain controlled.  Denies N/V.  Tolerating fluids.  Ambulated w/o issue last night.  Objective: Vital signs in last 24 hours: Temp:  [98 F (36.7 C)-98.8 F (37.1 C)] 98.7 F (37.1 C) (01/05 0239) Pulse Rate:  [93-109] 98 (01/05 0239) Resp:  [15-19] 18 (01/05 0239) BP: (121-149)/(76-92) 121/88 (01/05 0239) SpO2:  [96 %-100 %] 98 % (01/05 0239) Weight:  [87.1 kg (192 lb)] 87.1 kg (192 lb) (01/04 1052)  Intake/Output from previous day: 01/04 0701 - 01/05 0700 In: 3820 [I.V.:2820; IV Piggyback:1000] Out: 2670 [Urine:2550; Drains:70; Blood:50]  Intake/Output this shift: No intake/output data recorded.  Physical Exam:  General: Alert and oriented CV: RRR, palpable distal pulses Lungs: CTAB, equal chest rise Abdomen: Soft, NTND, no rebound or guarding, JP with serosanguineous drainage Incisions: c/d/i Ext: NT, No erythema GU: Foley draining clear-yellow urine Lab Results: Recent Labs    10/02/17 1950 10/03/17 0349  HGB 13.6 13.9  HCT 39.8 40.4   BMET Recent Labs    10/02/17 1950 10/03/17 0349  NA  --  139  K  --  4.2  CL  --  108  CO2  --  25  GLUCOSE  --  129*  BUN  --  9  CREATININE 1.21 1.00  CALCIUM  --  8.9     Studies/Results: No results found.  Assessment/Plan:  POD 1 s/p RALP  -OOBTC and ambulate -d/c JP drain -ADAT -Home today with Foley   LOS: 0 days   Ellison Hughs, MD 10/03/2017, 7:35 AM  [facility] Pager: [phone]

## 2017-10-03 NOTE — Discharge Summary (Signed)
Date of admission: 10/02/2017  Date of discharge: 10/03/2017  Admission diagnosis: Gleason 9 prostate cancer  Discharge diagnosis: Gleason 9 prostate cancer  Secondary diagnoses: HIV  History and Physical: For full details, please see admission history and physical. Briefly, Travis Wheeler is a 52 y.o. year old patient with Gleason 9 prostate cancer s/p RALP on 10/03/17.   Hospital Course: Post-op, the patient was monitored on the floor with no acute events.  On POD1, the patient was ambulating, tolerating a diet and his pain was controlled.   Laboratory values:  Recent Labs    10/02/17 1950 10/03/17 0349  HGB 13.6 13.9  HCT 39.8 40.4   Recent Labs    10/02/17 1950 10/03/17 0349  CREATININE 1.21 1.00    Disposition: Home  Discharge instruction: The patient was instructed to be ambulatory but told to refrain from heavy lifting, strenuous activity, or driving.   Discharge medications:  Allergies as of 10/03/2017   No Known Allergies     Medication List    TAKE these medications   acetaminophen 500 MG tablet Commonly known as:  TYLENOL Take 1,000 mg by mouth every 6 (six) hours as needed for moderate pain.   bictegravir-emtricitabine-tenofovir AF 50-200-25 MG Tabs tablet Commonly known as:  BIKTARVY Take 1 tablet by mouth daily.   cetirizine 10 MG tablet Commonly known as:  ZYRTEC Take 10 mg by mouth daily.   docusate sodium 100 MG capsule Commonly known as:  COLACE Take 1 capsule (100 mg total) by mouth 2 (two) times daily.   HYDROcodone-acetaminophen 5-325 MG tablet Commonly known as:  NORCO Take 1-2 tablets by mouth every 6 (six) hours as needed for moderate pain or severe pain.   ICY HOT EX Apply 1 application topically daily as needed (for muscle pain).   NYQUIL PO Take 30 mLs by mouth 4 (four) times daily as needed (for congestion).   PATADAY 0.2 % Soln Generic drug:  Olopatadine HCl Instill 1 drop into both eyes twice daily as needed for allergies    phenazopyridine 100 MG tablet Commonly known as:  PYRIDIUM Take 1 tablet (100 mg total) by mouth 3 (three) times daily as needed for pain.   SALONPAS JET SPRAY EX Apply 1 spray topically daily as needed (for muscle pain).   sulfamethoxazole-trimethoprim 800-160 MG tablet Commonly known as:  BACTRIM DS,SEPTRA DS Take 1 tablet by mouth 2 (two) times daily. Start the day prior to foley removal appointment       Followup:  Follow-up Information    Ceasar Mons, MD On 10/09/2017.   Specialty:  Urology Why:  at 10:15 Contact information: [address] [place] [address] [phone]

## 2017-10-04 NOTE — Anesthesia Postprocedure Evaluation (Signed)
Anesthesia Post Note  Patient: Caprice Wasko  Procedure(s) Performed: XI ROBOTIC ASSISTED LAPAROSCOPIC  PROSTATECTOMY (N/A ) BILATERAL PELVIC LYMPH NODE DISSECTION (Bilateral )     Patient location during evaluation: PACU Anesthesia Type: General Level of consciousness: awake and alert Pain management: pain level controlled Vital Signs Assessment: post-procedure vital signs reviewed and stable Respiratory status: spontaneous breathing, nonlabored ventilation, respiratory function stable and patient connected to nasal cannula oxygen Cardiovascular status: blood pressure returned to baseline and stable Postop Assessment: no apparent nausea or vomiting Anesthetic complications: no    Last Vitals:  Vitals:   10/02/17 1900 10/03/17 0239  BP: (!) 149/84 121/88  Pulse: (!) 106 98  Resp: 15 18  Temp: 36.9 C 37.1 C  SpO2: 96% 98%    Last Pain:  Vitals:   10/03/17 0815  TempSrc:   PainSc: [place]

## 2017-10-05 ENCOUNTER — Encounter (HOSPITAL_COMMUNITY): Payer: Self-pay | Admitting: Urology

## 2017-10-09 DIAGNOSIS — C61 Malignant neoplasm of prostate: Secondary | ICD-10-CM | POA: Diagnosis not present

## 2017-10-16 DIAGNOSIS — M62838 Other muscle spasm: Secondary | ICD-10-CM | POA: Diagnosis not present

## 2017-10-16 DIAGNOSIS — M6281 Muscle weakness (generalized): Secondary | ICD-10-CM | POA: Diagnosis not present

## 2017-10-16 DIAGNOSIS — C61 Malignant neoplasm of prostate: Secondary | ICD-10-CM | POA: Diagnosis not present

## 2017-10-22 DIAGNOSIS — M6281 Muscle weakness (generalized): Secondary | ICD-10-CM | POA: Diagnosis not present

## 2017-10-22 DIAGNOSIS — R972 Elevated prostate specific antigen [PSA]: Secondary | ICD-10-CM | POA: Diagnosis not present

## 2017-10-22 DIAGNOSIS — M62838 Other muscle spasm: Secondary | ICD-10-CM | POA: Diagnosis not present

## 2017-10-22 DIAGNOSIS — C61 Malignant neoplasm of prostate: Secondary | ICD-10-CM | POA: Diagnosis not present

## 2017-10-30 DIAGNOSIS — R972 Elevated prostate specific antigen [PSA]: Secondary | ICD-10-CM | POA: Diagnosis not present

## 2017-10-30 DIAGNOSIS — M62838 Other muscle spasm: Secondary | ICD-10-CM | POA: Diagnosis not present

## 2017-10-30 DIAGNOSIS — M6281 Muscle weakness (generalized): Secondary | ICD-10-CM | POA: Diagnosis not present

## 2017-10-30 DIAGNOSIS — C61 Malignant neoplasm of prostate: Secondary | ICD-10-CM | POA: Diagnosis not present

## 2017-11-16 DIAGNOSIS — C61 Malignant neoplasm of prostate: Secondary | ICD-10-CM | POA: Diagnosis not present

## 2017-11-23 DIAGNOSIS — N3946 Mixed incontinence: Secondary | ICD-10-CM | POA: Diagnosis not present

## 2017-11-26 DIAGNOSIS — N3946 Mixed incontinence: Secondary | ICD-10-CM | POA: Diagnosis not present

## 2017-11-26 DIAGNOSIS — M62838 Other muscle spasm: Secondary | ICD-10-CM | POA: Diagnosis not present

## 2017-11-26 DIAGNOSIS — C61 Malignant neoplasm of prostate: Secondary | ICD-10-CM | POA: Diagnosis not present

## 2017-11-26 DIAGNOSIS — M6281 Muscle weakness (generalized): Secondary | ICD-10-CM | POA: Diagnosis not present

## 2018-03-01 ENCOUNTER — Other Ambulatory Visit: Payer: BLUE CROSS/BLUE SHIELD

## 2018-03-01 ENCOUNTER — Other Ambulatory Visit: Payer: Self-pay | Admitting: *Deleted

## 2018-03-01 DIAGNOSIS — B20 Human immunodeficiency virus [HIV] disease: Secondary | ICD-10-CM

## 2018-03-01 DIAGNOSIS — Z113 Encounter for screening for infections with a predominantly sexual mode of transmission: Secondary | ICD-10-CM | POA: Diagnosis not present

## 2018-03-02 LAB — RPR TITER

## 2018-03-02 LAB — T-HELPER CELL (CD4) - (RCID CLINIC ONLY)
CD4 T CELL HELPER: 22 % — AB (ref 33–55)
CD4 T Cell Abs: 360 /uL — ABNORMAL LOW (ref 400–2700)

## 2018-03-02 LAB — RPR: RPR Ser Ql: REACTIVE — AB

## 2018-03-02 LAB — FLUORESCENT TREPONEMAL AB(FTA)-IGG-BLD: Fluorescent Treponemal ABS: NONREACTIVE

## 2018-03-03 LAB — HIV-1 RNA QUANT-NO REFLEX-BLD
HIV 1 RNA Quant: <20 copies/mL
HIV-1 RNA Quant, Log: <1.3 Log copies/mL

## 2018-03-04 ENCOUNTER — Other Ambulatory Visit: Payer: Self-pay | Admitting: Pharmacist

## 2018-03-04 DIAGNOSIS — B20 Human immunodeficiency virus [HIV] disease: Secondary | ICD-10-CM

## 2018-03-15 ENCOUNTER — Ambulatory Visit (INDEPENDENT_AMBULATORY_CARE_PROVIDER_SITE_OTHER): Payer: BLUE CROSS/BLUE SHIELD | Admitting: Internal Medicine

## 2018-03-15 ENCOUNTER — Encounter: Payer: Self-pay | Admitting: Internal Medicine

## 2018-03-15 VITALS — BP 134/83 | HR 80 | Temp 98.3°F | Ht 71.0 in | Wt 182.0 lb

## 2018-03-15 DIAGNOSIS — I2699 Other pulmonary embolism without acute cor pulmonale: Secondary | ICD-10-CM

## 2018-03-15 DIAGNOSIS — Z21 Asymptomatic human immunodeficiency virus [HIV] infection status: Secondary | ICD-10-CM | POA: Diagnosis not present

## 2018-03-15 DIAGNOSIS — Z23 Encounter for immunization: Secondary | ICD-10-CM

## 2018-03-15 DIAGNOSIS — Z113 Encounter for screening for infections with a predominantly sexual mode of transmission: Secondary | ICD-10-CM

## 2018-03-15 DIAGNOSIS — B20 Human immunodeficiency virus [HIV] disease: Secondary | ICD-10-CM | POA: Diagnosis not present

## 2018-03-15 MED ORDER — BICTEGRAVIR-EMTRICITAB-TENOFOV 50-200-25 MG PO TABS: 1.0000 | ORAL_TABLET | Freq: Every day | ORAL | 3 refills | Status: DC

## 2018-03-15 MED ORDER — BICTEGRAVIR-EMTRICITAB-TENOFOV 50-200-25 MG PO TABS
1.0000 | ORAL_TABLET | Freq: Every day | ORAL | 3 refills | Status: DC
Start: 2018-03-15 — End: 2018-03-15

## 2018-03-15 NOTE — Progress Notes (Signed)
   Subjective:    Patient ID: Travis Wheeler, male    DOB: 27-May-1966, 52 y.o.   MRN: [id number]  HPI Here for follow up of HIV. We last saw him last year and he had a recent PE at that time, was on Eliquis.  He was changed to [facility] and has been on this since.  He takes daily with no missed doses.  No concerns with the medication and feels well.  No associated rash, diarrhea.  CD4 360 and viral load remains < 20.  RPR 1:2 c/w old, treated infection with persistent titer.   Since last year he was diagnosed with prostate cancer and underwent radical prostectetomy.  He is otherwise feeling well now.     Review of Systems  Constitutional: Negative for fatigue.  Gastrointestinal: Negative for diarrhea.  Skin: Negative for rash.       Objective:   Physical Exam  Constitutional: He appears well-developed and well-nourished. No distress.  HENT:  Mouth/Throat: No oropharyngeal exudate.  Eyes: No scleral icterus.  Cardiovascular: Normal rate, regular rhythm and normal heart sounds.  No murmur heard. Pulmonary/Chest: Effort normal and breath sounds normal. No respiratory distress.  Lymphadenopathy:    He has no cervical adenopathy.  Skin: No rash noted.   SH: no tobacco       Assessment & Plan:

## 2018-03-15 NOTE — Assessment & Plan Note (Signed)
Doing well on Biktarvy with excellent compliance.  I will refill for 1 year and he can rtc in 1 year with labs prior to visit.

## 2018-03-15 NOTE — Assessment & Plan Note (Signed)
No longer on Eliquis 

## 2018-03-15 NOTE — Assessment & Plan Note (Signed)
RPR c/w old.  No treatment indicated.

## 2018-03-15 NOTE — Addendum Note (Signed)
Addended by: Alberteen Sam on: 03/15/2018 10:37 AM   Modules accepted: Orders

## 2018-07-30 DIAGNOSIS — C61 Malignant neoplasm of prostate: Secondary | ICD-10-CM | POA: Diagnosis not present

## 2018-08-05 DIAGNOSIS — R9721 Rising PSA following treatment for malignant neoplasm of prostate: Secondary | ICD-10-CM | POA: Diagnosis not present

## 2018-08-05 DIAGNOSIS — C61 Malignant neoplasm of prostate: Secondary | ICD-10-CM | POA: Diagnosis not present

## 2018-08-12 ENCOUNTER — Ambulatory Visit
Admission: RE | Admit: 2018-08-12 | Discharge: 2018-08-12 | Disposition: A | Discharge status: Home / Self Care | Payer: BLUE CROSS/BLUE SHIELD | Source: Ambulatory Visit | Attending: Urology | Admitting: Urology

## 2018-08-12 ENCOUNTER — Ambulatory Visit: Payer: BLUE CROSS/BLUE SHIELD

## 2018-08-12 DIAGNOSIS — Z79899 Other long term (current) drug therapy: Secondary | ICD-10-CM | POA: Insufficient documentation

## 2018-08-12 DIAGNOSIS — C61 Malignant neoplasm of prostate: Secondary | ICD-10-CM | POA: Insufficient documentation

## 2018-08-12 DIAGNOSIS — Z21 Asymptomatic human immunodeficiency virus [HIV] infection status: Secondary | ICD-10-CM | POA: Insufficient documentation

## 2018-08-16 NOTE — Progress Notes (Signed)
GU Location of Tumor / Histology: prostatic adenocarcinoma s/p RALP with BLPLND on 10/02/2017  If Prostate Cancer, Gleason Score is (4 + 5) and PSA is (5.31) pre treatment.. Prostate volume: 18.26 grams.   07/2018    PSA        0.042 10/2017      PSA        0.016  Travis Wheeler was referred by Dr. Delfina Redwood to Dr. Lovena Neighbours after routine physical in August 2018 revealed a PSA of 6.87. Dr. Tammi Klippel consulted with the patient in November 2018. Patient opted to have a prostatectomy. Unfortunately, a positive surgical margin was noted at the bladder neck.   Biopsies of prostate (if applicable) revealed:    Past/Anticipated interventions by urology, if any: prostate biopsy, bone scan (negative), referral to radiation oncology Dr. Lovena Neighbours  10-02-17 XI ROBOTIC [place]  FINAL DIAGNOSIS Diagnosis 10-02-17  Dr.  Harrell Gave [place]  1. Soft tissue, biopsy, peri prostatic fat - BENIGN ADIPOSE TISSUE. 2. Lymph nodes, regional resection, right pelvic - THERE IS NO EVIDENCE OF CARCINOMA IN 1 OF 1 LYMPH NODE (0/1). 3. Lymph nodes, regional resection, left pelvic - THERE IS NO EVIDENCE OF CARCINOMA IN 11 OF 11 LYMPH NODES (0/11). 4. Prostate, radical resection - PROSTATIC ADENOCARCINOMA, GLEASON SCORE 3 + 5 = 8/10, INVOLVING BOTH PROSTATE LOBES. - ADENOCARCINOMA IS BROADLY PRESENT AT THE RIGHT AND LEFT BLADDER NECK MARGINS AND INVOLVES THE RIGHT SEMINAL VESICLE. - PERINEURAL INVASION IS IDENTIFIED. - SEE ONCOLOGY TABLE BELOW. Microscopic Comment 4. PROSTATE RADICAL RESECTION/PROSTATECTOMY Procedure: Prostatectomy. Prostate gland: Weight: 35 grams. Dimension: 2.8 cm apex to base x 4.0 cm right to left x 3.0 cm anterior to posterior. Histologic type: Adenocarcinoma. Gleasons Score: 3 + 5 = 8/10. I  Patient denies receiving any ADT to date.   Past/Anticipated interventions by  medical oncology, if any: no  Weight changes, if any: denies.   Bowel/Bladder complaints, if any: Reports steady urine stream without difficulty emptying his bladder. Very pleased that he is almost completely continent. Reports occasional urgency and frequency. IPSS 3. SHIM 16. Denies dysuria or hematuria.   Nausea/Vomiting, if any: No  Pain issues, if any:  Denies   SAFETY ISSUES:  Prior radiation? No  Pacemaker/ICD? No  Possible current pregnancy? No  Is the patient on methotrexate? No  Current Complaints / other details:  52 year old male. Single. Father with hx of colon ca. Pt has HIV.

## 2018-08-18 ENCOUNTER — Other Ambulatory Visit: Payer: Self-pay

## 2018-08-18 ENCOUNTER — Ambulatory Visit
Admission: RE | Admit: 2018-08-18 | Discharge: 2018-08-18 | Disposition: A | Discharge status: Home / Self Care | Payer: BLUE CROSS/BLUE SHIELD | Source: Ambulatory Visit | Attending: Radiation Oncology | Admitting: Radiation Oncology

## 2018-08-18 ENCOUNTER — Ambulatory Visit
Admission: RE | Admit: 2018-08-18 | Discharge: 2018-08-18 | Disposition: A | Discharge status: Home / Self Care | Payer: BLUE CROSS/BLUE SHIELD | Source: Ambulatory Visit | Attending: Urology | Admitting: Urology

## 2018-08-18 ENCOUNTER — Encounter: Payer: Self-pay | Admitting: Urology

## 2018-08-18 ENCOUNTER — Ambulatory Visit: Payer: BLUE CROSS/BLUE SHIELD | Attending: Radiation Oncology

## 2018-08-18 ENCOUNTER — Encounter: Payer: Self-pay | Admitting: Radiation Oncology

## 2018-08-18 VITALS — BP 135/73 | HR 100 | Temp 98.7°F | Resp 20 | Ht 71.0 in | Wt 190.8 lb

## 2018-08-18 DIAGNOSIS — Z9079 Acquired absence of other genital organ(s): Secondary | ICD-10-CM | POA: Diagnosis not present

## 2018-08-18 DIAGNOSIS — C61 Malignant neoplasm of prostate: Secondary | ICD-10-CM | POA: Diagnosis not present

## 2018-08-18 DIAGNOSIS — Z1211 Encounter for screening for malignant neoplasm of colon: Secondary | ICD-10-CM | POA: Insufficient documentation

## 2018-08-18 DIAGNOSIS — R9721 Rising PSA following treatment for malignant neoplasm of prostate: Secondary | ICD-10-CM | POA: Diagnosis not present

## 2018-08-18 DIAGNOSIS — Z800 Family history of malignant neoplasm of digestive organs: Secondary | ICD-10-CM | POA: Insufficient documentation

## 2018-08-18 DIAGNOSIS — Z21 Asymptomatic human immunodeficiency virus [HIV] infection status: Secondary | ICD-10-CM | POA: Diagnosis not present

## 2018-08-18 DIAGNOSIS — Z79899 Other long term (current) drug therapy: Secondary | ICD-10-CM | POA: Diagnosis not present

## 2018-08-18 DIAGNOSIS — K5900 Constipation, unspecified: Secondary | ICD-10-CM | POA: Insufficient documentation

## 2018-08-18 NOTE — Progress Notes (Signed)
See progress note under physician encounter. 

## 2018-08-18 NOTE — Progress Notes (Signed)
Radiation Oncology         [phone] ________________________________  Re-Consultation Note  Name: Travis Wheeler MRN: [id number]  Date: 08/18/2018  DOB: 1966/07/25  QV:[id number], Jori Moll, MD  Davis Gourd*   REFERRING PHYSICIAN: Davis Gourd*  DIAGNOSIS: 52 y.o. gentleman with detectable, rising PSA s/p RALP for pT3bN0, Gleason 3+5 adenocarcinoma of the prostate    ICD-10-CM   1. Malignant neoplasm of prostate ([place]) C61     HISTORY OF PRESENT ILLNESS: Travis Wheeler is a 52 y.o. gentleman with a rising, detectable PSA s/p RALP for pT3bN0, Gleason 3+5 adenocarcinoma of the prostate.  He was initially noted to have an elevated PSA of 6.87 by his primary care physician, Dr. Delfina Redwood.  Accordingly, he was referred for evaluation in urology by Dr. Lovena Neighbours on 05/28/17,  digital rectal examination was performed at that time revealing slight firmness in the right lobe but no definite nodule.  A repeat PSA was performed on 06/18/2017 and remained elevated at 5.31. He proceeded to transrectal ultrasound with 12 biopsies of the prostate on 07/17/2017 revealing a maximum Gleason score of 4+5, seen in the right apex, right base lateral and right apex lateral. Additionally, Gleason 4+3 disease was seen in the right mid lateral and right base, Gleason 3+4 disease in the left apex, right mid gland, left base and left mid gland as well as Gleason 3+3 in the left base lateral, left mid lateral and left apex lateral. There was evidence of perineural invasion.   Disease staging with a CT A/P and Bone scan on 08/11/2017 showed no evidence of metastatic disease.  We met with him in consult in 07/2017 and at that time, he elected to proceed with prostatectomy for management of his disease.  He had a RALP with BPLND with Dr. Lovena Neighbours on 10/02/17.  Final surgical pathology revealed a pT3bN0, Gleason 3+5 adenocarcinoma of the prostate with positive surgical margins at the right and left bladder neck and  involving the right seminal vesicle.  There was perineural invasion identified but no lymph node involvement in the 12 nodes that were excised.  His initial postoperative PSA was 0.016 in February 2019 but more recently had increased to 0.042 on 07/30/2018.  Of note, he is HIV positive and has a h/o bilateral PE in 09/2016.  He was able to discontinue his anticoagulation in 04/2017 and has not had any further issues.  He has postoperative erectile dysfunction and has regained good bladder control at this point.  The patient reviewed the surgical pathology and recent post-treatment PSA results with his urologist and he has kindly been referred back to [place] today for discussion of potential adjuvant/salvage radiation treatment options.   PREVIOUS RADIATION THERAPY: No  PAST MEDICAL HISTORY:  Past Medical History:  Diagnosis Date  . History of kidney stones   . HIV infection ([place])   . Pneumonia   . Prostate cancer ([place])   . Seasonal allergies       PAST SURGICAL HISTORY: Past Surgical History:  Procedure Laterality Date  . EXAMINATION UNDER ANESTHESIA N/A 03/17/2014   Procedure: EXAM UNDER ANESTHESIA AND EXCISION OF ANAL MASS;  Surgeon: Ralene Ok, MD;  Location: [facility];  Service: General;  Laterality: N/A;  . LYMPH NODE DISSECTION Bilateral 10/02/2017   Procedure: BILATERAL PELVIC LYMPH NODE DISSECTION;  Surgeon: Ceasar Mons, MD;  Location: [facility];  Service: Urology;  Laterality: Bilateral;  . PILONIDAL CYST EXCISION    . PROSTATE BIOPSY    . ROBOT ASSISTED  LAPAROSCOPIC RADICAL PROSTATECTOMY N/A 10/02/2017   Procedure: XI ROBOTIC ASSISTED LAPAROSCOPIC  PROSTATECTOMY;  Surgeon: Ceasar Mons, MD;  Location: [facility];  Service: Urology;  Laterality: N/A;    FAMILY HISTORY:  Family History  Problem Relation Age of Onset  . Hypertension Mother   . Cancer Father        Colorectal  . Alcoholism Father   . Diabetes Maternal Grandmother   . Hypertension Maternal  Grandmother   . Diabetes Paternal Grandmother   . Hypertension Paternal Grandmother   . Cancer Maternal Aunt        great aunt/breast ca/mastectomy  . Cancer Cousin        second cousin/unknown type    SOCIAL HISTORY:  Social History   Socioeconomic History  . Marital status: Single    Spouse name: Not on file  . Number of children: Not on file  . Years of education: Not on file  . Highest education level: Not on file  Occupational History  . Not on file  Social Needs  . Financial resource strain: Not on file  . Food insecurity:    Worry: Not on file    Inability: Not on file  . Transportation needs:    Medical: Not on file    Non-medical: Not on file  Tobacco Use  . Smoking status: Never Smoker  . Smokeless tobacco: Never Used  Substance and Sexual Activity  . Alcohol use: Yes    Alcohol/week: 0.0 standard drinks    Comment: Socially  . Drug use: No  . Sexual activity: Not Currently    Partners: Male  Lifestyle  . Physical activity:    Days per week: Not on file    Minutes per session: Not on file  . Stress: Not on file  Relationships  . Social connections:    Talks on phone: Not on file    Gets together: Not on file    Attends religious service: Not on file    Active member of club or organization: Not on file    Attends meetings of clubs or organizations: Not on file    Relationship status: Not on file  . Intimate partner violence:    Fear of current or ex partner: Not on file    Emotionally abused: Not on file    Physically abused: Not on file    Forced sexual activity: Not on file  Other Topics Concern  . Not on file  Social History Narrative   Resides in [place].    ALLERGIES: Patient has no known allergies.  MEDICATIONS:  Current Outpatient Medications  Medication Sig Dispense Refill  . acetaminophen (TYLENOL) 500 MG tablet Take 1,000 mg by mouth every 6 (six) hours as needed for moderate pain.    . bictegravir-emtricitabine-tenofovir AF  (BIKTARVY) 50-200-25 MG TABS tablet Take 1 tablet by mouth daily. 90 tablet 3  . cetirizine (ZYRTEC) 10 MG tablet Take 10 mg by mouth daily.     Marland Kitchen docusate sodium (COLACE) 100 MG capsule Take 1 capsule (100 mg total) by mouth 2 (two) times daily. (Patient not taking: Reported on 08/18/2018) 30 capsule 0  . Menthol, Topical Analgesic, (ICY HOT EX) Apply 1 application topically daily as needed (for muscle pain).    . Menthol-Methyl Salicylate (SALONPAS JET SPRAY EX) Apply 1 spray topically daily as needed (for muscle pain).    Marland Kitchen PATADAY 0.2 % SOLN Instill 1 drop into both eyes twice daily as needed for allergies  3  .  Pseudoeph-Doxylamine-DM-APAP (NYQUIL PO) Take 30 mLs by mouth 4 (four) times daily as needed (for congestion).     No current facility-administered medications for this encounter.     REVIEW OF SYSTEMS:  On review of systems, the patient reports that he is doing well overall. He denies any chest pain, shortness of breath, cough, fevers, chills, night sweats, unintended weight changes. He denies any bowel disturbances, and denies abdominal pain, nausea or vomiting. He denies any new musculoskeletal or joint aches or pains. His IPSS was 3, indicating mild urinary symptoms. He reports his continence has almost completely returned since his prostatectomy. His SHIM was 16, indicating he has mild erectile dysfunction. A complete review of systems is obtained and is otherwise negative.    PHYSICAL EXAM:  Wt Readings from Last 3 Encounters:  08/18/18 190 lb 12.8 oz (86.5 kg)  03/15/18 182 lb (82.6 kg)  10/02/17 192 lb (87.1 kg)   Temp Readings from Last 3 Encounters:  08/18/18 98.7 F (37.1 C) (Oral)  03/15/18 98.3 F (36.8 C) (Oral)  10/03/17 98.7 F (37.1 C) (Oral)   BP Readings from Last 3 Encounters:  08/18/18 135/73  03/15/18 134/83  10/03/17 121/88   Pulse Readings from Last 3 Encounters:  08/18/18 100  03/15/18 80  10/03/17 98    /10  In general this is a well  appearing African American gentleman in no acute distress. He is alert and oriented x4 and appropriate throughout the examination. HEENT reveals that the patient is normocephalic, atraumatic. EOMs are intact. PERRLA. Skin is intact without any evidence of gross lesions. Cardiovascular exam reveals a regular rate and rhythm, no clicks rubs or murmurs are auscultated. Chest is clear to auscultation bilaterally. Lymphatic assessment is performed and does not reveal any adenopathy in the cervical, supraclavicular, axillary, or inguinal chains. Abdomen has active bowel sounds in all quadrants and is intact. The abdomen is soft, non tender, non distended. Lower extremities are negative for pretibial pitting edema, deep calf tenderness, cyanosis or clubbing.   KPS = 100  100 - Normal; no complaints; no evidence of disease. 90   - Able to carry on normal activity; minor signs or symptoms of disease. 80   - Normal activity with effort; some signs or symptoms of disease. 9   - Cares for self; unable to carry on normal activity or to do active work. 60   - Requires occasional assistance, but is able to care for most of his personal needs. 50   - Requires considerable assistance and frequent medical care. 14   - Disabled; requires special care and assistance. 78   - Severely disabled; hospital admission is indicated although death not imminent. 36   - Very sick; hospital admission necessary; active supportive treatment necessary. 10   - Moribund; fatal processes progressing rapidly. 0     - Dead  Karnofsky DA, Abelmann Crook, Craver LS and Burchenal [facility] [phone]) The use of the nitrogen mustards in the palliative treatment of carcinoma: with particular reference to bronchogenic carcinoma Cancer 1 634-56  LABORATORY DATA:  Lab Results  Component Value Date   WBC 16.5 (H) 10/02/2017   HGB 13.9 10/03/2017   HCT 40.4 10/03/2017   MCV 90.0 10/02/2017   PLT 274 10/02/2017   Lab Results  Component Value Date    NA 139 10/03/2017   K 4.2 10/03/2017   CL 108 10/03/2017   CO2 25 10/03/2017   Lab Results  Component Value Date   ALT 45 03/05/2017  AST 26 03/05/2017   ALKPHOS 97 03/05/2017   BILITOT 0.5 03/05/2017     RADIOGRAPHY: No results found.    IMPRESSION/PLAN: 1. 52 y.o. gentleman with detectable, rising PSA s/p RALP for pT3bN0, Gleason 3+5 adenocarcinoma of the prostate. Today we reviewed the findings and workup thus far.  We discussed the natural history of prostate cancer.  We reviewed the the implications of positive margins, extracapsular extension, and seminal vesicle involvement on the risk of prostate cancer recurrence. We reviewed some of the evidence suggesting an advantage for patients who undergo adjuvant radiotherapy in this setting in terms of disease control and overall survival. We also discussed some of the dilemmas related to the available evidence.  We discussed the SWOG trial which did show an improvement in disease-free survival as well as overall survival using adjuvant radiotherapy. However, we discussed the fact that the study did not carefully control the usage of adjuvant radiotherapy in the observation arm. There is increasing evidence that careful surveillance with ultrasensitive PSA may provide an opportunity for early salvage in patients who undergo observation, which can lead to excellent results in terms of disease control and survival. We discussed radiation treatment directed to the prostatic fossa with regard to the logistics and delivery of external beam radiation treatment.   At the conclusion of our conversation today, the patient would like to proceed with 7.5 weeks of external beam therapy in combination with ST-ADT. He has not received his first Lupron injection. We will coordinate a follow up visit with Dr. Lovena Neighbours in the near future to initiate ADT prior to his CT sim in preparation for beginning adjuvant XRT. We will share our findings with Dr. Lovena Neighbours and  move forward with treatment planning accordingly.     We enjoyed meeting with him today, and look forward to participating in the care of this very nice gentleman.   We spent 60 minutes face to face with the patient and more than 50% of that time was spent in counseling and/or coordination of care.    Nicholos Johns, PA-C    Tyler Pita, MD  [facility] Direct Dial: [phone]  Fax: [phone] [email or web address]  Skype  [email or web address]   This document serves as a record of services personally performed by Tyler Pita, MD and Freeman Caldron, PA-C. It was created on their behalf by Wilburn Mylar, a trained medical scribe. The creation of this record is based on the scribe's personal observations and the provider's statements to them. This document has been checked and approved by the attending provider.

## 2018-08-19 ENCOUNTER — Encounter: Payer: Self-pay | Admitting: General Practice

## 2018-08-19 NOTE — Progress Notes (Signed)
[facility] Psychosocial Distress Screening Clinical Social Work  Clinical Social Work was referred by distress screening protocol.  The patient scored a 8 on the Psychosocial Distress Thermometer which indicates moderate distress. Clinical Social Worker contacted patient by phone to assess for distress and other psychosocial needs. Attempted to reach patient, VM is full, unable to leave message.    ONCBCN DISTRESS SCREENING 08/18/2018  Screening Type Initial Screening  Distress experienced in past week (1-10) 8  Practical problem type Insurance  Family Problem type Other (comment)  Emotional problem type   Spiritual/Religous concerns type   Information Concerns Type Lack of info about diagnosis;Lack of info about treatment;Lack of info about complementary therapy choices  Physical Problem type   Physician notified of physical symptoms Yes  Referral to clinical psychology No  Referral to clinical social work No  Referral to dietition No  Referral to financial advocate No  Referral to support programs No  Referral to palliative care No    Clinical Social Worker follow up needed: No.  If yes, follow up plan:  Beverely Pace, [place], LCSW Clinical Social Worker Phone:  [phone]

## 2018-08-25 ENCOUNTER — Telehealth: Payer: Self-pay | Admitting: *Deleted

## 2018-08-25 NOTE — Telephone Encounter (Signed)
CALLED PATIENT TO UPDATE, UNABLE TO LEAVE MESSAGE DUE TO VM BEING FULL 

## 2018-08-31 ENCOUNTER — Telehealth: Payer: Self-pay | Admitting: *Deleted

## 2018-08-31 ENCOUNTER — Encounter: Payer: Self-pay | Admitting: Medical Oncology

## 2018-08-31 NOTE — Telephone Encounter (Signed)
Called patient to inform of ADT Inj. On 09-02-18- arrival time - 12:45 pm @ Dr. Jackson Latino Office, lvm for a return call

## 2018-09-01 DIAGNOSIS — C61 Malignant neoplasm of prostate: Secondary | ICD-10-CM | POA: Diagnosis not present

## 2018-09-02 ENCOUNTER — Telehealth: Payer: Self-pay | Admitting: Radiation Oncology

## 2018-09-02 NOTE — Telephone Encounter (Signed)
Received voicemail message from patient requesting return call. Patient reports that he received his ADT today. He goes onto explain he needs an appointment with [place] after 3:30 pm because he gets off work at 2:30 pm.

## 2018-09-02 NOTE — Telephone Encounter (Signed)
Yes- please have SIM contact patient to reschedule his CT SIM- currently he is on for 09/08/18 at 11am but sounds like he need appointment later in the day unless he is just referring to his preferred treatment times... -Lia Foyer

## 2018-09-08 ENCOUNTER — Ambulatory Visit: Payer: BLUE CROSS/BLUE SHIELD | Attending: Radiation Oncology | Admitting: Radiation Oncology

## 2018-09-08 DIAGNOSIS — Z79899 Other long term (current) drug therapy: Secondary | ICD-10-CM | POA: Insufficient documentation

## 2018-09-08 DIAGNOSIS — Z21 Asymptomatic human immunodeficiency virus [HIV] infection status: Secondary | ICD-10-CM | POA: Insufficient documentation

## 2018-09-08 DIAGNOSIS — C61 Malignant neoplasm of prostate: Secondary | ICD-10-CM | POA: Insufficient documentation

## 2018-09-13 ENCOUNTER — Encounter: Payer: Self-pay | Admitting: Medical Oncology

## 2018-09-21 ENCOUNTER — Other Ambulatory Visit
Admission: RE | Admit: 2018-09-21 | Discharge: 2018-09-21 | Disposition: A | Discharge status: Home / Self Care | Payer: Commercial Managed Care - POS | Source: Ambulatory Visit | Attending: General Practice | Admitting: General Practice

## 2018-09-21 LAB — CBC AND DIFFERENTIAL
Basophils %: 0.5 % (ref 0.0–3.0)
Basophils Absolute: 0 10*3/uL (ref 0.0–0.3)
Eosinophils %: 3.7 % (ref 0.0–7.0)
Eosinophils Absolute: 0.2 10*3/uL (ref 0.0–0.8)
Hematocrit: 46.7 % (ref 39.0–52.5)
Hemoglobin: 15 gm/dL (ref 13.0–17.5)
Lymphocytes Absolute: 2.3 10*3/uL (ref 0.6–5.1)
Lymphocytes: 35.8 % (ref 15.0–46.0)
MCH: 30 pg (ref 28–35)
MCHC: 32 gm/dL (ref 32–36)
MCV: 94 fL (ref 80–100)
MPV: 7.3 fL (ref 6.0–10.0)
Monocytes Absolute: 0.8 10*3/uL (ref 0.1–1.7)
Monocytes: 12.8 % (ref 3.0–15.0)
Neutrophils %: 47.3 % (ref 42.0–78.0)
Neutrophils Absolute: 3 10*3/uL (ref 1.7–8.6)
PLT CT: 207 10*3/uL (ref 130–440)
RBC: 4.99 10*6/uL (ref 4.00–5.70)
RDW: 12.2 % (ref 11.0–14.0)
WBC: 6.4 10*3/uL (ref 4.0–11.0)

## 2018-09-21 LAB — COMPREHENSIVE METABOLIC PANEL
ALT: 23 U/L (ref 0–55)
AST (SGOT): 18 U/L (ref 10–42)
Albumin/Globulin Ratio: 1.54 Ratio (ref 0.80–2.00)
Albumin: 4.3 gm/dL (ref 3.5–5.0)
Alkaline Phosphatase: 76 U/L (ref 40–145)
Anion Gap: 13.3 mMol/L (ref 7.0–18.0)
BUN / Creatinine Ratio: 19.2 Ratio (ref 10.0–30.0)
BUN: 15 mg/dL (ref 7–22)
Bilirubin, Total: 0.9 mg/dL (ref 0.1–1.2)
CO2: 26 mMol/L (ref 20–30)
Calcium: 9.3 mg/dL (ref 8.5–10.5)
Chloride: 105 mMol/L (ref 98–110)
Creatinine: 0.78 mg/dL — ABNORMAL LOW (ref 0.80–1.30)
EGFR: 104 mL/min/{1.73_m2} (ref 60–150)
Globulin: 2.8 gm/dL (ref 2.0–4.0)
Glucose: 87 mg/dL (ref 71–99)
Osmolality Calculated: 280 mOsm/kg (ref 275–300)
Potassium: 4.3 mMol/L (ref 3.5–5.3)
Protein, Total: 7.1 gm/dL (ref 6.0–8.3)
Sodium: 140 mMol/L (ref 136–147)

## 2018-09-21 LAB — TSH: TSH: 2.15 u[IU]/mL (ref 0.40–4.20)

## 2018-09-21 LAB — C-REACTIVE PROTEIN: C-Reactive Protein: 0.54 mg/dL (ref 0.02–0.80)

## 2018-09-21 LAB — HEMOGLOBIN A1C: Hgb A1C, %: 5.4 %

## 2018-09-21 LAB — LIPID PANEL
Cholesterol: 207 mg/dL — ABNORMAL HIGH (ref 75–199)
Coronary Heart Disease Risk: 5.31
HDL: 39 mg/dL — ABNORMAL LOW (ref 40–55)
LDL Calculated: 142 mg/dL
Triglycerides: 129 mg/dL (ref 10–150)
VLDL: 26 (ref 0–40)

## 2018-09-21 LAB — HEPATITIS C ANTIBODY: Hepatitis C Antibody: NONREACTIVE

## 2018-09-21 LAB — HIV AG/AB 4TH GENERATION: HIV Ag/Ab, 4th Generation: NONREACTIVE

## 2018-09-21 LAB — PSA: PSA: 1.273 ng/mL (ref 0.000–4.000)

## 2018-09-22 LAB — RPR: RPR: NONREACTIVE

## 2018-09-23 ENCOUNTER — Other Ambulatory Visit (HOSPITAL_BASED_OUTPATIENT_CLINIC_OR_DEPARTMENT_OTHER): Payer: Self-pay | Admitting: General Practice

## 2018-09-23 DIAGNOSIS — R1011 Right upper quadrant pain: Secondary | ICD-10-CM

## 2018-09-23 LAB — LYME AB, TOTAL,REFLEX TO WESTERN BLOT (IGG & IGM): Lyme AB: NONREACTIVE

## 2018-09-25 LAB — TESTOSTERONE, FREE, TOTAL
Testosterone Free: 43.6 pg/mL (ref 35.0–155.0)
Testosterone Total MS: 245 ng/dL — ABNORMAL LOW (ref 250–1100)

## 2018-09-27 ENCOUNTER — Telehealth: Payer: Self-pay | Admitting: Radiation Oncology

## 2018-09-27 NOTE — Telephone Encounter (Signed)
Patient phoned back. Confirmed appointment on Friday.

## 2018-09-27 NOTE — Telephone Encounter (Signed)
Phoned patient to ensure he is aware of simulation appointment on Friday, 10/01/17 at 3pm. No answer. Mailbox full thus, unable to leave a voicemail message.

## 2018-09-30 ENCOUNTER — Telehealth: Payer: Self-pay | Admitting: *Deleted

## 2018-09-30 NOTE — Telephone Encounter (Signed)
CALLED PATIENT TO REMIND OF SIM APPT. FOR 10-01-18 @ 3 PM, SPOKE WITH PATIENT AND HE IS AWARE OF THIS APPT.

## 2018-10-01 ENCOUNTER — Ambulatory Visit
Admission: RE | Admit: 2018-10-01 | Discharge: 2018-10-01 | Disposition: A | Discharge status: Home / Self Care | Payer: BLUE CROSS/BLUE SHIELD | Source: Ambulatory Visit | Attending: Radiation Oncology | Admitting: Radiation Oncology

## 2018-10-01 ENCOUNTER — Ambulatory Visit (HOSPITAL_BASED_OUTPATIENT_CLINIC_OR_DEPARTMENT_OTHER)
Admission: RE | Admit: 2018-10-01 | Discharge: 2018-10-01 | Disposition: A | Discharge status: Home / Self Care | Payer: 59 | Source: Ambulatory Visit | Attending: General Practice | Admitting: General Practice

## 2018-10-01 DIAGNOSIS — R1011 Right upper quadrant pain: Secondary | ICD-10-CM | POA: Insufficient documentation

## 2018-10-01 DIAGNOSIS — C61 Malignant neoplasm of prostate: Secondary | ICD-10-CM

## 2018-10-01 DIAGNOSIS — Z21 Asymptomatic human immunodeficiency virus [HIV] infection status: Secondary | ICD-10-CM | POA: Insufficient documentation

## 2018-10-01 DIAGNOSIS — Z79899 Other long term (current) drug therapy: Secondary | ICD-10-CM | POA: Insufficient documentation

## 2018-10-01 NOTE — Progress Notes (Signed)
  Radiation Oncology         [phone] ________________________________  Name: Travis Wheeler MRN: [id number]  Date: 10/01/2018  DOB: [birth year]  SIMULATION AND TREATMENT PLANNING NOTE    ICD-10-CM   1. Prostate cancer ([place]) C61     DIAGNOSIS:   53 y.o.gentleman with detectable, rising PSA of 0.042 s/p RALP for pT3bN0, Gleason 3+5 adenocarcinoma of the prostate  NARRATIVE:  The patient was brought to the [facility].  Identity was confirmed.  All relevant records and images related to the planned course of therapy were reviewed.  The patient freely provided informed written consent to proceed with treatment after reviewing the details related to the planned course of therapy. The consent form was witnessed and verified by the simulation staff.  Then, the patient was set-up in a stable reproducible supine position for radiation therapy.  A vacuum lock pillow device was custom fabricated to position his legs in a reproducible immobilized position.  Then, I performed a urethrogram under sterile conditions to identify the prostatic bed.  CT images were obtained.  Surface markings were placed.  The CT images were loaded into the planning software.  Then the prostate bed target, pelvic lymph node target and avoidance structures including the rectum, bladder, bowel and hips were contoured.  Treatment planning then occurred.  The radiation prescription was entered and confirmed.  A total of one complex treatment devices were fabricated. I have requested : Intensity Modulated Radiotherapy (IMRT) is medically necessary for this case for the following reason:  Rectal sparing.Marland Kitchen  PLAN:  The patient will receive 45 Gy in 25 fractions of 1.8 Gy, followed by a boost to the prostate bed to a total dose of 68.4 Gy with 13 additional fractions of 1.8 Gy.   ________________________________  Sheral Apley Tammi Klippel, M.D.    This document serves as a record of services personally performed by Tyler Pita, MD. It was created on his behalf by Wilburn Mylar, a trained medical scribe. The creation of this record is based on the scribe's personal observations and the provider's statements to them. This document has been checked and approved by the attending provider.

## 2018-10-11 DIAGNOSIS — C61 Malignant neoplasm of prostate: Secondary | ICD-10-CM | POA: Diagnosis not present

## 2018-10-11 DIAGNOSIS — Z21 Asymptomatic human immunodeficiency virus [HIV] infection status: Secondary | ICD-10-CM | POA: Diagnosis not present

## 2018-10-11 DIAGNOSIS — Z79899 Other long term (current) drug therapy: Secondary | ICD-10-CM | POA: Diagnosis not present

## 2018-10-12 ENCOUNTER — Ambulatory Visit
Admission: RE | Admit: 2018-10-12 | Discharge: 2018-10-12 | Disposition: A | Discharge status: Home / Self Care | Payer: BLUE CROSS/BLUE SHIELD | Source: Ambulatory Visit | Attending: Radiation Oncology | Admitting: Radiation Oncology

## 2018-10-12 ENCOUNTER — Encounter: Payer: Self-pay | Admitting: Medical Oncology

## 2018-10-12 DIAGNOSIS — C61 Malignant neoplasm of prostate: Secondary | ICD-10-CM | POA: Diagnosis not present

## 2018-10-12 DIAGNOSIS — Z79899 Other long term (current) drug therapy: Secondary | ICD-10-CM | POA: Diagnosis not present

## 2018-10-12 DIAGNOSIS — Z21 Asymptomatic human immunodeficiency virus [HIV] infection status: Secondary | ICD-10-CM | POA: Diagnosis not present

## 2018-10-13 ENCOUNTER — Ambulatory Visit
Admission: RE | Admit: 2018-10-13 | Discharge: 2018-10-13 | Disposition: A | Discharge status: Home / Self Care | Payer: BLUE CROSS/BLUE SHIELD | Source: Ambulatory Visit | Attending: Radiation Oncology | Admitting: Radiation Oncology

## 2018-10-13 DIAGNOSIS — Z21 Asymptomatic human immunodeficiency virus [HIV] infection status: Secondary | ICD-10-CM | POA: Diagnosis not present

## 2018-10-13 DIAGNOSIS — C61 Malignant neoplasm of prostate: Secondary | ICD-10-CM | POA: Diagnosis not present

## 2018-10-13 DIAGNOSIS — Z79899 Other long term (current) drug therapy: Secondary | ICD-10-CM | POA: Diagnosis not present

## 2018-10-14 ENCOUNTER — Ambulatory Visit
Admission: RE | Admit: 2018-10-14 | Discharge: 2018-10-14 | Disposition: A | Discharge status: Home / Self Care | Payer: BLUE CROSS/BLUE SHIELD | Source: Ambulatory Visit | Attending: Radiation Oncology | Admitting: Radiation Oncology

## 2018-10-14 DIAGNOSIS — C61 Malignant neoplasm of prostate: Secondary | ICD-10-CM | POA: Diagnosis not present

## 2018-10-14 DIAGNOSIS — Z79899 Other long term (current) drug therapy: Secondary | ICD-10-CM | POA: Diagnosis not present

## 2018-10-14 DIAGNOSIS — Z21 Asymptomatic human immunodeficiency virus [HIV] infection status: Secondary | ICD-10-CM | POA: Diagnosis not present

## 2018-10-15 ENCOUNTER — Ambulatory Visit
Admission: RE | Admit: 2018-10-15 | Discharge: 2018-10-15 | Disposition: A | Discharge status: Home / Self Care | Payer: BLUE CROSS/BLUE SHIELD | Source: Ambulatory Visit | Attending: Radiation Oncology | Admitting: Radiation Oncology

## 2018-10-15 DIAGNOSIS — C61 Malignant neoplasm of prostate: Secondary | ICD-10-CM | POA: Diagnosis not present

## 2018-10-15 DIAGNOSIS — Z21 Asymptomatic human immunodeficiency virus [HIV] infection status: Secondary | ICD-10-CM | POA: Diagnosis not present

## 2018-10-15 DIAGNOSIS — Z79899 Other long term (current) drug therapy: Secondary | ICD-10-CM | POA: Diagnosis not present

## 2018-10-18 ENCOUNTER — Ambulatory Visit
Admission: RE | Admit: 2018-10-18 | Discharge: 2018-10-18 | Disposition: A | Discharge status: Home / Self Care | Payer: BLUE CROSS/BLUE SHIELD | Source: Ambulatory Visit | Attending: Radiation Oncology | Admitting: Radiation Oncology

## 2018-10-18 DIAGNOSIS — Z79899 Other long term (current) drug therapy: Secondary | ICD-10-CM | POA: Diagnosis not present

## 2018-10-18 DIAGNOSIS — Z21 Asymptomatic human immunodeficiency virus [HIV] infection status: Secondary | ICD-10-CM | POA: Diagnosis not present

## 2018-10-18 DIAGNOSIS — C61 Malignant neoplasm of prostate: Secondary | ICD-10-CM | POA: Diagnosis not present

## 2018-10-19 ENCOUNTER — Ambulatory Visit
Admission: RE | Admit: 2018-10-19 | Discharge: 2018-10-19 | Disposition: A | Discharge status: Home / Self Care | Payer: BLUE CROSS/BLUE SHIELD | Source: Ambulatory Visit | Attending: Radiation Oncology | Admitting: Radiation Oncology

## 2018-10-19 DIAGNOSIS — C61 Malignant neoplasm of prostate: Secondary | ICD-10-CM | POA: Diagnosis not present

## 2018-10-19 DIAGNOSIS — Z21 Asymptomatic human immunodeficiency virus [HIV] infection status: Secondary | ICD-10-CM | POA: Diagnosis not present

## 2018-10-19 DIAGNOSIS — Z79899 Other long term (current) drug therapy: Secondary | ICD-10-CM | POA: Diagnosis not present

## 2018-10-20 ENCOUNTER — Ambulatory Visit
Admission: RE | Admit: 2018-10-20 | Discharge: 2018-10-20 | Disposition: A | Discharge status: Home / Self Care | Payer: BLUE CROSS/BLUE SHIELD | Source: Ambulatory Visit | Attending: Radiation Oncology | Admitting: Radiation Oncology

## 2018-10-20 DIAGNOSIS — Z79899 Other long term (current) drug therapy: Secondary | ICD-10-CM | POA: Diagnosis not present

## 2018-10-20 DIAGNOSIS — Z21 Asymptomatic human immunodeficiency virus [HIV] infection status: Secondary | ICD-10-CM | POA: Diagnosis not present

## 2018-10-20 DIAGNOSIS — C61 Malignant neoplasm of prostate: Secondary | ICD-10-CM | POA: Diagnosis not present

## 2018-10-21 ENCOUNTER — Ambulatory Visit
Admission: RE | Admit: 2018-10-21 | Discharge: 2018-10-21 | Disposition: A | Discharge status: Home / Self Care | Payer: BLUE CROSS/BLUE SHIELD | Source: Ambulatory Visit | Attending: Radiation Oncology | Admitting: Radiation Oncology

## 2018-10-21 DIAGNOSIS — Z21 Asymptomatic human immunodeficiency virus [HIV] infection status: Secondary | ICD-10-CM | POA: Diagnosis not present

## 2018-10-21 DIAGNOSIS — Z79899 Other long term (current) drug therapy: Secondary | ICD-10-CM | POA: Diagnosis not present

## 2018-10-21 DIAGNOSIS — C61 Malignant neoplasm of prostate: Secondary | ICD-10-CM | POA: Diagnosis not present

## 2018-10-22 ENCOUNTER — Ambulatory Visit
Admission: RE | Admit: 2018-10-22 | Discharge: 2018-10-22 | Disposition: A | Discharge status: Home / Self Care | Payer: BLUE CROSS/BLUE SHIELD | Source: Ambulatory Visit | Attending: Radiation Oncology | Admitting: Radiation Oncology

## 2018-10-22 DIAGNOSIS — Z79899 Other long term (current) drug therapy: Secondary | ICD-10-CM | POA: Diagnosis not present

## 2018-10-22 DIAGNOSIS — Z21 Asymptomatic human immunodeficiency virus [HIV] infection status: Secondary | ICD-10-CM | POA: Diagnosis not present

## 2018-10-22 DIAGNOSIS — C61 Malignant neoplasm of prostate: Secondary | ICD-10-CM | POA: Diagnosis not present

## 2018-10-25 ENCOUNTER — Ambulatory Visit
Admission: RE | Admit: 2018-10-25 | Discharge: 2018-10-25 | Disposition: A | Discharge status: Home / Self Care | Payer: BLUE CROSS/BLUE SHIELD | Source: Ambulatory Visit | Attending: Radiation Oncology | Admitting: Radiation Oncology

## 2018-10-25 DIAGNOSIS — Z79899 Other long term (current) drug therapy: Secondary | ICD-10-CM | POA: Diagnosis not present

## 2018-10-25 DIAGNOSIS — Z21 Asymptomatic human immunodeficiency virus [HIV] infection status: Secondary | ICD-10-CM | POA: Diagnosis not present

## 2018-10-25 DIAGNOSIS — C61 Malignant neoplasm of prostate: Secondary | ICD-10-CM | POA: Diagnosis not present

## 2018-10-26 ENCOUNTER — Ambulatory Visit
Admission: RE | Admit: 2018-10-26 | Discharge: 2018-10-26 | Disposition: A | Discharge status: Home / Self Care | Payer: BLUE CROSS/BLUE SHIELD | Source: Ambulatory Visit | Attending: Radiation Oncology | Admitting: Radiation Oncology

## 2018-10-26 DIAGNOSIS — Z21 Asymptomatic human immunodeficiency virus [HIV] infection status: Secondary | ICD-10-CM | POA: Diagnosis not present

## 2018-10-26 DIAGNOSIS — C61 Malignant neoplasm of prostate: Secondary | ICD-10-CM | POA: Diagnosis not present

## 2018-10-26 DIAGNOSIS — Z79899 Other long term (current) drug therapy: Secondary | ICD-10-CM | POA: Diagnosis not present

## 2018-10-27 ENCOUNTER — Ambulatory Visit
Admission: RE | Admit: 2018-10-27 | Discharge: 2018-10-27 | Disposition: A | Discharge status: Home / Self Care | Payer: BLUE CROSS/BLUE SHIELD | Source: Ambulatory Visit | Attending: Radiation Oncology | Admitting: Radiation Oncology

## 2018-10-27 DIAGNOSIS — Z21 Asymptomatic human immunodeficiency virus [HIV] infection status: Secondary | ICD-10-CM | POA: Diagnosis not present

## 2018-10-27 DIAGNOSIS — C61 Malignant neoplasm of prostate: Secondary | ICD-10-CM | POA: Diagnosis not present

## 2018-10-27 DIAGNOSIS — Z79899 Other long term (current) drug therapy: Secondary | ICD-10-CM | POA: Diagnosis not present

## 2018-10-28 ENCOUNTER — Ambulatory Visit
Admission: RE | Admit: 2018-10-28 | Discharge: 2018-10-28 | Disposition: A | Discharge status: Home / Self Care | Payer: BLUE CROSS/BLUE SHIELD | Source: Ambulatory Visit | Attending: Radiation Oncology | Admitting: Radiation Oncology

## 2018-10-28 DIAGNOSIS — Z79899 Other long term (current) drug therapy: Secondary | ICD-10-CM | POA: Diagnosis not present

## 2018-10-28 DIAGNOSIS — C61 Malignant neoplasm of prostate: Secondary | ICD-10-CM | POA: Diagnosis not present

## 2018-10-28 DIAGNOSIS — Z21 Asymptomatic human immunodeficiency virus [HIV] infection status: Secondary | ICD-10-CM | POA: Diagnosis not present

## 2018-10-29 ENCOUNTER — Ambulatory Visit
Admission: RE | Admit: 2018-10-29 | Discharge: 2018-10-29 | Disposition: A | Discharge status: Home / Self Care | Payer: BLUE CROSS/BLUE SHIELD | Source: Ambulatory Visit | Attending: Radiation Oncology | Admitting: Radiation Oncology

## 2018-10-29 DIAGNOSIS — C61 Malignant neoplasm of prostate: Secondary | ICD-10-CM | POA: Diagnosis not present

## 2018-10-29 DIAGNOSIS — Z79899 Other long term (current) drug therapy: Secondary | ICD-10-CM | POA: Diagnosis not present

## 2018-10-29 DIAGNOSIS — Z21 Asymptomatic human immunodeficiency virus [HIV] infection status: Secondary | ICD-10-CM | POA: Diagnosis not present

## 2018-11-01 ENCOUNTER — Ambulatory Visit
Admission: RE | Admit: 2018-11-01 | Discharge: 2018-11-01 | Disposition: A | Discharge status: Home / Self Care | Payer: BLUE CROSS/BLUE SHIELD | Source: Ambulatory Visit | Attending: Radiation Oncology | Admitting: Radiation Oncology

## 2018-11-01 DIAGNOSIS — C61 Malignant neoplasm of prostate: Secondary | ICD-10-CM | POA: Diagnosis not present

## 2018-11-01 DIAGNOSIS — Z79899 Other long term (current) drug therapy: Secondary | ICD-10-CM | POA: Insufficient documentation

## 2018-11-01 DIAGNOSIS — Z21 Asymptomatic human immunodeficiency virus [HIV] infection status: Secondary | ICD-10-CM | POA: Insufficient documentation

## 2018-11-02 ENCOUNTER — Ambulatory Visit
Admission: RE | Admit: 2018-11-02 | Discharge: 2018-11-02 | Disposition: A | Discharge status: Home / Self Care | Payer: BLUE CROSS/BLUE SHIELD | Source: Ambulatory Visit | Attending: Radiation Oncology | Admitting: Radiation Oncology

## 2018-11-02 DIAGNOSIS — Z79899 Other long term (current) drug therapy: Secondary | ICD-10-CM | POA: Diagnosis not present

## 2018-11-02 DIAGNOSIS — Z21 Asymptomatic human immunodeficiency virus [HIV] infection status: Secondary | ICD-10-CM | POA: Diagnosis not present

## 2018-11-02 DIAGNOSIS — C61 Malignant neoplasm of prostate: Secondary | ICD-10-CM | POA: Diagnosis not present

## 2018-11-03 ENCOUNTER — Ambulatory Visit
Admission: RE | Admit: 2018-11-03 | Discharge: 2018-11-03 | Disposition: A | Discharge status: Home / Self Care | Payer: BLUE CROSS/BLUE SHIELD | Source: Ambulatory Visit | Attending: Radiation Oncology | Admitting: Radiation Oncology

## 2018-11-03 DIAGNOSIS — Z79899 Other long term (current) drug therapy: Secondary | ICD-10-CM | POA: Diagnosis not present

## 2018-11-03 DIAGNOSIS — Z21 Asymptomatic human immunodeficiency virus [HIV] infection status: Secondary | ICD-10-CM | POA: Diagnosis not present

## 2018-11-03 DIAGNOSIS — C61 Malignant neoplasm of prostate: Secondary | ICD-10-CM | POA: Diagnosis not present

## 2018-11-04 ENCOUNTER — Ambulatory Visit
Admission: RE | Admit: 2018-11-04 | Discharge: 2018-11-04 | Disposition: A | Discharge status: Home / Self Care | Payer: BLUE CROSS/BLUE SHIELD | Source: Ambulatory Visit | Attending: Radiation Oncology | Admitting: Radiation Oncology

## 2018-11-04 ENCOUNTER — Encounter: Payer: Self-pay | Admitting: Medical Oncology

## 2018-11-04 DIAGNOSIS — C61 Malignant neoplasm of prostate: Secondary | ICD-10-CM | POA: Diagnosis not present

## 2018-11-04 DIAGNOSIS — Z21 Asymptomatic human immunodeficiency virus [HIV] infection status: Secondary | ICD-10-CM | POA: Diagnosis not present

## 2018-11-04 DIAGNOSIS — Z79899 Other long term (current) drug therapy: Secondary | ICD-10-CM | POA: Diagnosis not present

## 2018-11-05 ENCOUNTER — Ambulatory Visit
Admission: RE | Admit: 2018-11-05 | Discharge: 2018-11-05 | Disposition: A | Discharge status: Home / Self Care | Payer: BLUE CROSS/BLUE SHIELD | Source: Ambulatory Visit | Attending: Radiation Oncology | Admitting: Radiation Oncology

## 2018-11-05 DIAGNOSIS — C61 Malignant neoplasm of prostate: Secondary | ICD-10-CM | POA: Diagnosis not present

## 2018-11-05 DIAGNOSIS — Z79899 Other long term (current) drug therapy: Secondary | ICD-10-CM | POA: Diagnosis not present

## 2018-11-05 DIAGNOSIS — Z21 Asymptomatic human immunodeficiency virus [HIV] infection status: Secondary | ICD-10-CM | POA: Diagnosis not present

## 2018-11-08 ENCOUNTER — Ambulatory Visit
Admission: RE | Admit: 2018-11-08 | Discharge: 2018-11-08 | Disposition: A | Discharge status: Home / Self Care | Payer: BLUE CROSS/BLUE SHIELD | Source: Ambulatory Visit | Attending: Radiation Oncology | Admitting: Radiation Oncology

## 2018-11-08 DIAGNOSIS — C61 Malignant neoplasm of prostate: Secondary | ICD-10-CM | POA: Diagnosis not present

## 2018-11-08 DIAGNOSIS — Z21 Asymptomatic human immunodeficiency virus [HIV] infection status: Secondary | ICD-10-CM | POA: Diagnosis not present

## 2018-11-08 DIAGNOSIS — Z79899 Other long term (current) drug therapy: Secondary | ICD-10-CM | POA: Diagnosis not present

## 2018-11-09 ENCOUNTER — Ambulatory Visit
Admission: RE | Admit: 2018-11-09 | Discharge: 2018-11-09 | Disposition: A | Discharge status: Home / Self Care | Payer: BLUE CROSS/BLUE SHIELD | Source: Ambulatory Visit | Attending: Radiation Oncology | Admitting: Radiation Oncology

## 2018-11-09 DIAGNOSIS — Z21 Asymptomatic human immunodeficiency virus [HIV] infection status: Secondary | ICD-10-CM | POA: Diagnosis not present

## 2018-11-09 DIAGNOSIS — C61 Malignant neoplasm of prostate: Secondary | ICD-10-CM | POA: Diagnosis not present

## 2018-11-09 DIAGNOSIS — Z79899 Other long term (current) drug therapy: Secondary | ICD-10-CM | POA: Diagnosis not present

## 2018-11-10 ENCOUNTER — Ambulatory Visit: Payer: BLUE CROSS/BLUE SHIELD

## 2018-11-11 ENCOUNTER — Ambulatory Visit
Admission: RE | Admit: 2018-11-11 | Discharge: 2018-11-11 | Disposition: A | Discharge status: Home / Self Care | Payer: BLUE CROSS/BLUE SHIELD | Source: Ambulatory Visit | Attending: Radiation Oncology | Admitting: Radiation Oncology

## 2018-11-11 DIAGNOSIS — C61 Malignant neoplasm of prostate: Secondary | ICD-10-CM | POA: Diagnosis not present

## 2018-11-11 DIAGNOSIS — Z79899 Other long term (current) drug therapy: Secondary | ICD-10-CM | POA: Diagnosis not present

## 2018-11-11 DIAGNOSIS — Z21 Asymptomatic human immunodeficiency virus [HIV] infection status: Secondary | ICD-10-CM | POA: Diagnosis not present

## 2018-11-12 ENCOUNTER — Ambulatory Visit
Admission: RE | Admit: 2018-11-12 | Discharge: 2018-11-12 | Disposition: A | Discharge status: Home / Self Care | Payer: BLUE CROSS/BLUE SHIELD | Source: Ambulatory Visit | Attending: Radiation Oncology | Admitting: Radiation Oncology

## 2018-11-12 ENCOUNTER — Encounter: Payer: Self-pay | Admitting: Radiation Oncology

## 2018-11-12 DIAGNOSIS — Z21 Asymptomatic human immunodeficiency virus [HIV] infection status: Secondary | ICD-10-CM | POA: Diagnosis not present

## 2018-11-12 DIAGNOSIS — Z79899 Other long term (current) drug therapy: Secondary | ICD-10-CM | POA: Diagnosis not present

## 2018-11-12 DIAGNOSIS — C61 Malignant neoplasm of prostate: Secondary | ICD-10-CM | POA: Diagnosis not present

## 2018-11-15 ENCOUNTER — Ambulatory Visit
Admission: RE | Admit: 2018-11-15 | Discharge: 2018-11-15 | Disposition: A | Discharge status: Home / Self Care | Payer: BLUE CROSS/BLUE SHIELD | Source: Ambulatory Visit | Attending: Radiation Oncology | Admitting: Radiation Oncology

## 2018-11-15 DIAGNOSIS — Z21 Asymptomatic human immunodeficiency virus [HIV] infection status: Secondary | ICD-10-CM | POA: Diagnosis not present

## 2018-11-15 DIAGNOSIS — Z79899 Other long term (current) drug therapy: Secondary | ICD-10-CM | POA: Diagnosis not present

## 2018-11-15 DIAGNOSIS — C61 Malignant neoplasm of prostate: Secondary | ICD-10-CM | POA: Diagnosis not present

## 2018-11-16 ENCOUNTER — Ambulatory Visit: Payer: BLUE CROSS/BLUE SHIELD

## 2018-11-16 ENCOUNTER — Ambulatory Visit
Admission: RE | Admit: 2018-11-16 | Discharge: 2018-11-16 | Disposition: A | Discharge status: Home / Self Care | Payer: BLUE CROSS/BLUE SHIELD | Source: Ambulatory Visit | Attending: Radiation Oncology | Admitting: Radiation Oncology

## 2018-11-16 DIAGNOSIS — Z21 Asymptomatic human immunodeficiency virus [HIV] infection status: Secondary | ICD-10-CM | POA: Diagnosis not present

## 2018-11-16 DIAGNOSIS — Z79899 Other long term (current) drug therapy: Secondary | ICD-10-CM | POA: Diagnosis not present

## 2018-11-16 DIAGNOSIS — C61 Malignant neoplasm of prostate: Secondary | ICD-10-CM | POA: Diagnosis not present

## 2018-11-17 ENCOUNTER — Ambulatory Visit
Admission: RE | Admit: 2018-11-17 | Discharge: 2018-11-17 | Disposition: A | Discharge status: Home / Self Care | Payer: BLUE CROSS/BLUE SHIELD | Source: Ambulatory Visit | Attending: Radiation Oncology | Admitting: Radiation Oncology

## 2018-11-17 ENCOUNTER — Ambulatory Visit: Payer: BLUE CROSS/BLUE SHIELD

## 2018-11-17 DIAGNOSIS — Z21 Asymptomatic human immunodeficiency virus [HIV] infection status: Secondary | ICD-10-CM | POA: Diagnosis not present

## 2018-11-17 DIAGNOSIS — Z79899 Other long term (current) drug therapy: Secondary | ICD-10-CM | POA: Diagnosis not present

## 2018-11-17 DIAGNOSIS — C61 Malignant neoplasm of prostate: Secondary | ICD-10-CM | POA: Diagnosis not present

## 2018-11-18 ENCOUNTER — Ambulatory Visit
Admission: RE | Admit: 2018-11-18 | Discharge: 2018-11-18 | Disposition: A | Discharge status: Home / Self Care | Payer: BLUE CROSS/BLUE SHIELD | Source: Ambulatory Visit | Attending: Radiation Oncology | Admitting: Radiation Oncology

## 2018-11-18 DIAGNOSIS — Z21 Asymptomatic human immunodeficiency virus [HIV] infection status: Secondary | ICD-10-CM | POA: Diagnosis not present

## 2018-11-18 DIAGNOSIS — C61 Malignant neoplasm of prostate: Secondary | ICD-10-CM | POA: Diagnosis not present

## 2018-11-18 DIAGNOSIS — Z79899 Other long term (current) drug therapy: Secondary | ICD-10-CM | POA: Diagnosis not present

## 2018-11-19 ENCOUNTER — Other Ambulatory Visit: Payer: Self-pay | Admitting: Radiation Oncology

## 2018-11-19 ENCOUNTER — Ambulatory Visit
Admission: RE | Admit: 2018-11-19 | Discharge: 2018-11-19 | Disposition: A | Discharge status: Home / Self Care | Payer: BLUE CROSS/BLUE SHIELD | Source: Ambulatory Visit | Attending: Radiation Oncology | Admitting: Radiation Oncology

## 2018-11-19 DIAGNOSIS — C61 Malignant neoplasm of prostate: Secondary | ICD-10-CM | POA: Diagnosis not present

## 2018-11-19 DIAGNOSIS — Z79899 Other long term (current) drug therapy: Secondary | ICD-10-CM | POA: Diagnosis not present

## 2018-11-19 DIAGNOSIS — Z21 Asymptomatic human immunodeficiency virus [HIV] infection status: Secondary | ICD-10-CM | POA: Diagnosis not present

## 2018-11-22 ENCOUNTER — Ambulatory Visit: Payer: BLUE CROSS/BLUE SHIELD

## 2018-11-22 ENCOUNTER — Telehealth: Payer: Self-pay | Admitting: Radiation Oncology

## 2018-11-22 NOTE — Telephone Encounter (Signed)
Received voicemail message from patient wishing to cancel radiation treatment for today. Phoned patient to inquire. Patient reports he didn't feel well when he woke up this morning and his legs felt weak. Reports he called into work and planned to rest the remainder of the day. Denies having a fever, nausea, vomiting, or congestion. Patient reports his intent to present tomorrow for treatment. Patient understands to contact this RN for needs. Informed Jehnna, RT on L3 of this finding. She verbalized her intent to cancel the patient's treatment appointment for today.

## 2018-11-23 ENCOUNTER — Telehealth: Payer: Self-pay | Admitting: *Deleted

## 2018-11-23 ENCOUNTER — Telehealth: Payer: Self-pay | Admitting: Licensed Clinical Social Worker

## 2018-11-23 ENCOUNTER — Telehealth: Payer: Self-pay | Admitting: Radiation Oncology

## 2018-11-23 ENCOUNTER — Ambulatory Visit: Payer: BLUE CROSS/BLUE SHIELD

## 2018-11-23 DIAGNOSIS — R6883 Chills (without fever): Secondary | ICD-10-CM | POA: Diagnosis not present

## 2018-11-23 DIAGNOSIS — R5383 Other fatigue: Secondary | ICD-10-CM | POA: Diagnosis not present

## 2018-11-23 DIAGNOSIS — R61 Generalized hyperhidrosis: Secondary | ICD-10-CM | POA: Diagnosis not present

## 2018-11-23 NOTE — Telephone Encounter (Signed)
Received voicemail message from patient cancelling today's treatment. Also, patient request a call back. Notified Anna, RT on L2 of this finding. Phoned patient back. Patient states, "I feel worse today." He confirms feeling weak with achy joints. Denies nausea, vomiting, diarrhea or fever. Instructed patient to contact PCP for appointment asap. Patient verbalized understanding and expressed appreciation for the return call.

## 2018-11-23 NOTE — Telephone Encounter (Signed)
A genetic counseling appt has been scheduled for the pt to see Faith Rogue on 3/9 at 2pm. Enid Derry from [facility] will give the pt the appt date and time.

## 2018-11-23 NOTE — Telephone Encounter (Signed)
CALLED GENETICS TO  ARRANGE AN APPT. FOR THIS PATIENT, LVM FOR A RETURN CALL

## 2018-11-24 ENCOUNTER — Telehealth: Payer: Self-pay | Admitting: Radiation Oncology

## 2018-11-24 ENCOUNTER — Ambulatory Visit: Payer: BLUE CROSS/BLUE SHIELD

## 2018-11-24 NOTE — Telephone Encounter (Signed)
Received correspondence from the after hours team that the patient wishes to cancel his radiation treatments for the remainder of the week due to fatigue. Also, it was noted that his PCP ruled out the flu. Informed Kayla, RT on L3 of this finding.

## 2018-11-25 ENCOUNTER — Ambulatory Visit: Payer: BLUE CROSS/BLUE SHIELD

## 2018-11-25 ENCOUNTER — Telehealth: Payer: Self-pay | Admitting: Radiation Oncology

## 2018-11-25 NOTE — Telephone Encounter (Signed)
Received voicemail message from patient requesting return call. Phoned patient back. Patient reports he has itchy bumps all over his body except for his "private area," hands and feet. Explained these bumps are not a side effect of his radiation. Also, patient reports great fatigue and achy joints. Patient reports he changed his laundry detergent but went last night and got one free of fragrance and dyes. Patient reports he was seen at urgent care and given prednisone. Patient wishes to cancel radiation treatment for the rest of the week. Patient reports his job is requesting he complete FMLA paperwork. Instructed patient to retrieve paperwork from employer then bring to this RN for completion. Patient verbalized understanding of all reviewed.

## 2018-11-26 ENCOUNTER — Ambulatory Visit: Payer: BLUE CROSS/BLUE SHIELD

## 2018-11-26 ENCOUNTER — Encounter: Payer: Self-pay | Admitting: Radiation Oncology

## 2018-11-26 ENCOUNTER — Telehealth: Payer: Self-pay | Admitting: Radiation Oncology

## 2018-11-26 NOTE — Telephone Encounter (Signed)
Phoned patient. Informed him that his return to work letter is ready for pick up in radiation oncology nursing.

## 2018-11-29 ENCOUNTER — Ambulatory Visit
Admission: RE | Admit: 2018-11-29 | Discharge: 2018-11-29 | Disposition: A | Discharge status: Home / Self Care | Payer: BLUE CROSS/BLUE SHIELD | Source: Ambulatory Visit | Attending: Radiation Oncology | Admitting: Radiation Oncology

## 2018-11-29 DIAGNOSIS — Z79899 Other long term (current) drug therapy: Secondary | ICD-10-CM | POA: Diagnosis not present

## 2018-11-29 DIAGNOSIS — C61 Malignant neoplasm of prostate: Secondary | ICD-10-CM | POA: Diagnosis not present

## 2018-11-29 DIAGNOSIS — Z21 Asymptomatic human immunodeficiency virus [HIV] infection status: Secondary | ICD-10-CM | POA: Diagnosis not present

## 2018-11-30 ENCOUNTER — Ambulatory Visit
Admission: RE | Admit: 2018-11-30 | Discharge: 2018-11-30 | Disposition: A | Discharge status: Home / Self Care | Payer: BLUE CROSS/BLUE SHIELD | Source: Ambulatory Visit | Attending: Radiation Oncology | Admitting: Radiation Oncology

## 2018-11-30 DIAGNOSIS — C61 Malignant neoplasm of prostate: Secondary | ICD-10-CM | POA: Diagnosis not present

## 2018-11-30 DIAGNOSIS — Z79899 Other long term (current) drug therapy: Secondary | ICD-10-CM | POA: Diagnosis not present

## 2018-11-30 DIAGNOSIS — Z21 Asymptomatic human immunodeficiency virus [HIV] infection status: Secondary | ICD-10-CM | POA: Diagnosis not present

## 2018-12-01 ENCOUNTER — Ambulatory Visit
Admission: RE | Admit: 2018-12-01 | Discharge: 2018-12-01 | Disposition: A | Discharge status: Home / Self Care | Payer: BLUE CROSS/BLUE SHIELD | Source: Ambulatory Visit | Attending: Radiation Oncology | Admitting: Radiation Oncology

## 2018-12-01 ENCOUNTER — Ambulatory Visit: Payer: BLUE CROSS/BLUE SHIELD

## 2018-12-01 DIAGNOSIS — Z79899 Other long term (current) drug therapy: Secondary | ICD-10-CM | POA: Diagnosis not present

## 2018-12-01 DIAGNOSIS — C61 Malignant neoplasm of prostate: Secondary | ICD-10-CM | POA: Diagnosis not present

## 2018-12-01 DIAGNOSIS — Z21 Asymptomatic human immunodeficiency virus [HIV] infection status: Secondary | ICD-10-CM | POA: Diagnosis not present

## 2018-12-02 ENCOUNTER — Ambulatory Visit: Payer: BLUE CROSS/BLUE SHIELD

## 2018-12-02 ENCOUNTER — Ambulatory Visit
Admission: RE | Admit: 2018-12-02 | Discharge: 2018-12-02 | Disposition: A | Discharge status: Home / Self Care | Payer: BLUE CROSS/BLUE SHIELD | Source: Ambulatory Visit | Attending: Radiation Oncology | Admitting: Radiation Oncology

## 2018-12-02 DIAGNOSIS — Z79899 Other long term (current) drug therapy: Secondary | ICD-10-CM | POA: Diagnosis not present

## 2018-12-02 DIAGNOSIS — C61 Malignant neoplasm of prostate: Secondary | ICD-10-CM | POA: Diagnosis not present

## 2018-12-02 DIAGNOSIS — Z21 Asymptomatic human immunodeficiency virus [HIV] infection status: Secondary | ICD-10-CM | POA: Diagnosis not present

## 2018-12-03 ENCOUNTER — Ambulatory Visit
Admission: RE | Admit: 2018-12-03 | Discharge: 2018-12-03 | Disposition: A | Discharge status: Home / Self Care | Payer: BLUE CROSS/BLUE SHIELD | Source: Ambulatory Visit | Attending: Radiation Oncology | Admitting: Radiation Oncology

## 2018-12-03 ENCOUNTER — Ambulatory Visit: Payer: BLUE CROSS/BLUE SHIELD

## 2018-12-03 DIAGNOSIS — C61 Malignant neoplasm of prostate: Secondary | ICD-10-CM | POA: Diagnosis not present

## 2018-12-03 DIAGNOSIS — Z21 Asymptomatic human immunodeficiency virus [HIV] infection status: Secondary | ICD-10-CM | POA: Diagnosis not present

## 2018-12-03 DIAGNOSIS — Z79899 Other long term (current) drug therapy: Secondary | ICD-10-CM | POA: Diagnosis not present

## 2018-12-06 ENCOUNTER — Ambulatory Visit
Admission: RE | Admit: 2018-12-06 | Discharge: 2018-12-06 | Disposition: A | Discharge status: Home / Self Care | Payer: BLUE CROSS/BLUE SHIELD | Source: Ambulatory Visit | Attending: Radiation Oncology | Admitting: Radiation Oncology

## 2018-12-06 ENCOUNTER — Ambulatory Visit: Payer: BLUE CROSS/BLUE SHIELD

## 2018-12-06 ENCOUNTER — Inpatient Hospital Stay: Payer: BLUE CROSS/BLUE SHIELD | Attending: Genetic Counselor | Admitting: Licensed Clinical Social Worker

## 2018-12-06 ENCOUNTER — Inpatient Hospital Stay: Payer: BLUE CROSS/BLUE SHIELD

## 2018-12-06 ENCOUNTER — Encounter: Payer: Self-pay | Admitting: Licensed Clinical Social Worker

## 2018-12-06 DIAGNOSIS — Z21 Asymptomatic human immunodeficiency virus [HIV] infection status: Secondary | ICD-10-CM | POA: Diagnosis not present

## 2018-12-06 DIAGNOSIS — Z79899 Other long term (current) drug therapy: Secondary | ICD-10-CM | POA: Diagnosis not present

## 2018-12-06 DIAGNOSIS — C61 Malignant neoplasm of prostate: Secondary | ICD-10-CM | POA: Diagnosis not present

## 2018-12-07 ENCOUNTER — Ambulatory Visit: Payer: BLUE CROSS/BLUE SHIELD

## 2018-12-07 ENCOUNTER — Ambulatory Visit
Admission: RE | Admit: 2018-12-07 | Discharge: 2018-12-07 | Disposition: A | Discharge status: Home / Self Care | Payer: BLUE CROSS/BLUE SHIELD | Source: Ambulatory Visit | Attending: Radiation Oncology | Admitting: Radiation Oncology

## 2018-12-07 DIAGNOSIS — C61 Malignant neoplasm of prostate: Secondary | ICD-10-CM | POA: Diagnosis not present

## 2018-12-07 DIAGNOSIS — Z79899 Other long term (current) drug therapy: Secondary | ICD-10-CM | POA: Diagnosis not present

## 2018-12-07 DIAGNOSIS — Z21 Asymptomatic human immunodeficiency virus [HIV] infection status: Secondary | ICD-10-CM | POA: Diagnosis not present

## 2018-12-08 ENCOUNTER — Ambulatory Visit
Admission: RE | Admit: 2018-12-08 | Discharge: 2018-12-08 | Disposition: A | Discharge status: Home / Self Care | Payer: BLUE CROSS/BLUE SHIELD | Source: Ambulatory Visit | Attending: Radiation Oncology | Admitting: Radiation Oncology

## 2018-12-08 DIAGNOSIS — C61 Malignant neoplasm of prostate: Secondary | ICD-10-CM | POA: Diagnosis not present

## 2018-12-08 DIAGNOSIS — Z79899 Other long term (current) drug therapy: Secondary | ICD-10-CM | POA: Diagnosis not present

## 2018-12-08 DIAGNOSIS — Z21 Asymptomatic human immunodeficiency virus [HIV] infection status: Secondary | ICD-10-CM | POA: Diagnosis not present

## 2018-12-09 ENCOUNTER — Ambulatory Visit
Admission: RE | Admit: 2018-12-09 | Discharge: 2018-12-09 | Disposition: A | Discharge status: Home / Self Care | Payer: BLUE CROSS/BLUE SHIELD | Source: Ambulatory Visit | Attending: Radiation Oncology | Admitting: Radiation Oncology

## 2018-12-09 DIAGNOSIS — Z21 Asymptomatic human immunodeficiency virus [HIV] infection status: Secondary | ICD-10-CM | POA: Diagnosis not present

## 2018-12-09 DIAGNOSIS — C61 Malignant neoplasm of prostate: Secondary | ICD-10-CM | POA: Diagnosis not present

## 2018-12-09 DIAGNOSIS — Z79899 Other long term (current) drug therapy: Secondary | ICD-10-CM | POA: Diagnosis not present

## 2018-12-10 ENCOUNTER — Other Ambulatory Visit: Payer: Self-pay

## 2018-12-10 ENCOUNTER — Ambulatory Visit
Admission: RE | Admit: 2018-12-10 | Discharge: 2018-12-10 | Disposition: A | Discharge status: Home / Self Care | Payer: BLUE CROSS/BLUE SHIELD | Source: Ambulatory Visit | Attending: Radiation Oncology | Admitting: Radiation Oncology

## 2018-12-10 DIAGNOSIS — Z79899 Other long term (current) drug therapy: Secondary | ICD-10-CM | POA: Diagnosis not present

## 2018-12-10 DIAGNOSIS — Z21 Asymptomatic human immunodeficiency virus [HIV] infection status: Secondary | ICD-10-CM | POA: Diagnosis not present

## 2018-12-10 DIAGNOSIS — C61 Malignant neoplasm of prostate: Secondary | ICD-10-CM | POA: Diagnosis not present

## 2018-12-27 ENCOUNTER — Telehealth: Payer: Self-pay | Admitting: Radiation Oncology

## 2018-12-27 NOTE — Telephone Encounter (Signed)
Phoned patient to ensure he is aware of follow up appointment on 01/19/2019 at 1530. No answer. Left voicemail message detailing such.

## 2018-12-31 ENCOUNTER — Telehealth: Payer: Self-pay | Admitting: Radiation Oncology

## 2018-12-31 ENCOUNTER — Encounter: Payer: Self-pay | Admitting: Radiation Oncology

## 2018-12-31 NOTE — Progress Notes (Signed)
  Radiation Oncology         [phone]) [phone] ________________________________  Name: Travis Wheeler MRN: [id number]  Date: 12/31/2018  DOB: 1966-07-10  End of Treatment Note  Diagnosis:   53 y.o. gentleman with detectable, rising PSA s/p RALP for pT3bN0, Gleason 3+5 adenocarcinoma of the prostate     Indication for treatment:  Curative, Definitive Radiotherapy       Radiation treatment dates:   10/12/2018 - 12/10/2018  Site/dose:  1. The prostate fossa and pelvic lymph nodes were initially treated to 45 Gy in 25 fractions of 1.8 Gy  2. The prostate fossa only was boosted to 68.4 Gy with 13 additional fractions of 1.8 Gy   Beams/energy:  1. The prostate fossa  and pelvic lymph nodes were initially treated using VMAT intensity modulated radiotherapy delivering 6 megavolt photons. Image guidance was performed with CB-CT studies prior to each fraction. He was immobilized with a body fix lower extremity mold.  2. The prostate fossa only was boosted using VMAT intensity modulated radiotherapy delivering 6 megavolt photons. Image guidance was performed with CB-CT studies prior to each fraction. He was immobilized with a body fix lower extremity mold.  Narrative: The patient tolerated radiation treatment relatively well. He reported nocturia between 2-4 times each night, urgency, leakage, diarrhea treated with Imodium, and fatigue. He denied dysuria or hematuria, difficulty emptying his bladder, and issues with his stream. He noted abdominal soreness at the injection sites and hot flashes related to his ADT injections.  Plan: The patient has completed radiation treatment. He will return to radiation oncology clinic for routine followup in one month. I advised him to call or return sooner if he has any questions or concerns related to his recovery or treatment. ________________________________  Sheral Apley. Tammi Klippel, M.D.   This document serves as a record of services personally performed by Tyler Pita, MD. It was created on his behalf by Wilburn Mylar, a trained medical scribe. The creation of this record is based on the scribe's personal observations and the provider's statements to them. This document has been checked and approved by the attending provider.

## 2018-12-31 NOTE — Telephone Encounter (Signed)
Patient has completed radiation therapy for prostate ca. Patient left voicemail inquiring what to do for "achy" skin. Phoned patient back. No answer. Left voicemail message advising cocoa butter bid. Left my contact information should patient have further questions.

## 2019-01-12 ENCOUNTER — Ambulatory Visit: Payer: Self-pay | Admitting: Urology

## 2019-01-18 ENCOUNTER — Telehealth: Payer: Self-pay | Admitting: *Deleted

## 2019-01-18 ENCOUNTER — Telehealth: Payer: Self-pay

## 2019-01-18 NOTE — Telephone Encounter (Signed)
Called patient to inform that Travis Wheeler will be calling him for this fu appt. on 01-19-19, instead of him coming in, spoke with patient and he is aware of this

## 2019-01-18 NOTE — Telephone Encounter (Signed)
After several attempts to contact, left a voice mail to call back this nurse, have not heard back from [place] regarding tomorrow's appt being telephone encounter instead of in person. Will attempt to reach patient as time permits.

## 2019-01-19 ENCOUNTER — Ambulatory Visit: Payer: BLUE CROSS/BLUE SHIELD | Admitting: Urology

## 2019-01-20 ENCOUNTER — Other Ambulatory Visit: Payer: Self-pay | Admitting: Internal Medicine

## 2019-01-20 DIAGNOSIS — B20 Human immunodeficiency virus [HIV] disease: Secondary | ICD-10-CM

## 2019-02-22 ENCOUNTER — Encounter (INDEPENDENT_AMBULATORY_CARE_PROVIDER_SITE_OTHER): Payer: Self-pay | Admitting: Medical

## 2019-02-22 ENCOUNTER — Encounter (INDEPENDENT_AMBULATORY_CARE_PROVIDER_SITE_OTHER): Payer: Self-pay | Admitting: Nurse Practitioner

## 2019-02-22 ENCOUNTER — Ambulatory Visit (INDEPENDENT_AMBULATORY_CARE_PROVIDER_SITE_OTHER): Payer: No Typology Code available for payment source

## 2019-02-22 ENCOUNTER — Ambulatory Visit (INDEPENDENT_AMBULATORY_CARE_PROVIDER_SITE_OTHER): Payer: No Typology Code available for payment source | Admitting: Medical

## 2019-02-22 ENCOUNTER — Telehealth (INDEPENDENT_AMBULATORY_CARE_PROVIDER_SITE_OTHER): Payer: No Typology Code available for payment source | Admitting: Nurse Practitioner

## 2019-02-22 ENCOUNTER — Other Ambulatory Visit (INDEPENDENT_AMBULATORY_CARE_PROVIDER_SITE_OTHER): Payer: No Typology Code available for payment source

## 2019-02-22 ENCOUNTER — Other Ambulatory Visit
Admission: RE | Admit: 2019-02-22 | Discharge: 2019-02-22 | Disposition: A | Discharge status: Home / Self Care | Payer: Commercial Managed Care - POS | Source: Ambulatory Visit | Attending: Medical | Admitting: Medical

## 2019-02-22 VITALS — HR 102 | Temp 99.2°F | Resp 20

## 2019-02-22 VITALS — Ht 68.0 in | Wt 279.0 lb

## 2019-02-22 DIAGNOSIS — R059 Cough, unspecified: Secondary | ICD-10-CM

## 2019-02-22 DIAGNOSIS — R05 Cough: Secondary | ICD-10-CM

## 2019-02-22 DIAGNOSIS — J181 Lobar pneumonia, unspecified organism: Secondary | ICD-10-CM

## 2019-02-22 DIAGNOSIS — R0602 Shortness of breath: Secondary | ICD-10-CM

## 2019-02-22 MED ORDER — AZITHROMYCIN 250 MG PO TABS
ORAL_TABLET | ORAL | 0 refills | Status: DC
Start: 2019-02-22 — End: 2019-02-22

## 2019-02-22 MED ORDER — DOXYCYCLINE HYCLATE 100 MG PO CAPS
100.0000 mg | ORAL_CAPSULE | Freq: Two times a day (BID) | ORAL | 0 refills | Status: AC
Start: 2019-02-22 — End: 2019-03-01

## 2019-02-22 NOTE — Progress Notes (Signed)
Subjective:    Patient ID: Allen Lane is a 53 y.o. male.  The patient was evaluated using telehealth platform.  Exam findings are from visual observation and/or having patient perform various physical exam tasks under my guidance.  Verbal consent for evaluation was obtained.    HPI  Allen Lane presents today for cough, nasal congestion, and worsening SOB for 2 days.  He reports taking sudafed with little relief.  He denies chest pain but states he has tightness in his chest when he attempts a deep breath.  He states that he has been quarantined to his home for 2 months as he is able to tele-communicate for work.  He states that [place] but is otherwise not exposed to the general public.  He denies fevers, productive sputum, and no N/V/D.      The following portions of the patient's history were reviewed and updated as appropriate: allergies, current medications, past family history, past medical history, past social history, past surgical history and problem list.    Review of Systems   Constitutional: Negative for fatigue and fever.   HENT: Positive for congestion and sinus pressure. Negative for sinus pain and sore throat.    Respiratory: Positive for cough, chest tightness and shortness of breath.    Cardiovascular: Negative for chest pain.   Gastrointestinal: Negative for abdominal pain, diarrhea, nausea and vomiting.   Genitourinary: Negative for difficulty urinating.   Musculoskeletal: Negative for myalgias.   Skin: Negative for rash.   Allergic/Immunologic: Negative for immunocompromised state.   Neurological: Negative for dizziness, light-headedness and headaches.   Psychiatric/Behavioral: The patient is not nervous/anxious.          Objective:    Ht 1.727 m (5\' 8" )    Wt 126.6 kg (279 lb)    BMI 42.42 kg/m     Physical Exam  Nursing note reviewed.   Constitutional:       General: He is not in acute distress.     Appearance: Normal appearance.   HENT:      Head:  Normocephalic and atraumatic.      Nose: Nose normal.      Mouth/Throat:      Mouth: Mucous membranes are moist.   Eyes:      Pupils: Pupils are equal, round, and reactive to light.   Neck:      Musculoskeletal: Normal range of motion.      Comments: Per patient's self exam  Pulmonary:      Effort: No respiratory distress.      Comments: Per patient's self exam: SOB, worse with lying down, no wheezing with instructed forced exhalation, no signs of cyanosis, able to speak in full sentences, note coarse dry cough and some mild SOB immediately after but recovers quickly  Abdominal:      Palpations: Abdomen is soft.      Tenderness: There is no abdominal tenderness.   Musculoskeletal: Normal range of motion.   Lymphadenopathy:      Cervical: No cervical adenopathy.   Skin:     General: Skin is warm and dry.      Comments: Per patient's self exam   Neurological:      General: No focal deficit present.      Mental Status: He is alert and oriented to person, place, and time.           Assessment and Plan:       Allen Lane was seen  today for cough, chest congestion and shortness of breath.    Diagnoses and all orders for this visit:    Cough  -     Ambulatory referral to COVID Respiratory Clinic - [facility]    SOB (shortness of breath)  -     Ambulatory referral to COVID Respiratory Clinic - [facility]    -Discussed with Leonette Most that with his respiratory symptoms I would refer him to the COVID respiratory clinic for further evaluation.  -Encouraged to go to the ED for any worsening symptoms  -Continue Sudafed as directed, add Claritin/Zyrtec and/or Flonase as directed  -Follow-up with PCP as needed        Allen Athens, NP  [facility]  02/22/2019  2:33 PM

## 2019-02-22 NOTE — Progress Notes (Signed)
Subjective:    Patient ID: Allen Lane is a 53 y.o. male.    HPI  Patient presents to the respiratory clinic today for follow-up after a telemedicine visit with his PCP.  He reports that he has had 3 days of nonproductive cough, shortness of breath and wheezing, nasal congestion and a feeling of bubbling in his sinuses, sinus pressure and pain bilaterally, he reports that his symptoms began with diffuse full and congested feeling in his chest which persists since onset 3 days ago.  He has had no known cough COVID exposure.  He has not had a documented fever at home but today he was 99 2 at office check-in.     Review of Systems   Constitutional: Negative for chills and fever.   Respiratory: Positive for cough and shortness of breath.    Cardiovascular: Negative for leg swelling.   Gastrointestinal: Negative for abdominal pain and vomiting.   Skin: Negative for rash.   Neurological: Negative for weakness and numbness.           Objective:    Physical Exam  Constitutional:       General: He is not in acute distress.     Appearance: He is ill-appearing. He is not toxic-appearing or diaphoretic.   HENT:      Right Ear: External ear normal. No drainage, swelling or tenderness. No mastoid tenderness.      Left Ear: External ear normal. No drainage, swelling or tenderness. No mastoid tenderness.      Nose:      Right Sinus: No maxillary sinus tenderness or frontal sinus tenderness.      Left Sinus: No maxillary sinus tenderness or frontal sinus tenderness.   Cardiovascular:      Rate and Rhythm: Normal rate and regular rhythm.      Heart sounds: No murmur.   Pulmonary:      Effort: Pulmonary effort is normal. No respiratory distress.      Breath sounds: No stridor. Examination of the left-upper field reveals wheezing. Wheezing present. No rhonchi or rales.   Lymphadenopathy:      Cervical: No cervical adenopathy.   Neurological:      Mental Status: He is alert.             Assessment:       1. Cough    2. 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 Lobar pneumonia          Plan:       Temperature 99.2 in office and SaO2 93% at room air with ambulation. COVID swab performed today.  Chest x-ray with early right lower lobe consolidation as read by radiology.  Treat with doxycycline 100 mg twice daily for 7 days.  I discussed with the patient the importance for close follow-up if any worsening or new symptoms at all and he will go to the emergency room or come here immediately if there is any worsening of his symptoms.  I would like him to follow-up in 3 days for recheck even if better. Discussed with the patient the possibility of COVID-19 as a cause further symptoms.  We discussed that most cases are able to be managed as an outpatient at home.  We discussed the importance of supportive care such as over-the-counter antipyretics and adequate oral intake.  We discussed that testing is indicated and the patient will isolate at home per CDC guidelines until the test results are returned or they are otherwise cleared  by our office.  We discussed methods of isolating at home and a handout from the CDC was given to the patient.  If there is any worsening or new symptoms they will call the office or go to the emergency room immediately.

## 2019-02-24 ENCOUNTER — Ambulatory Visit (INDEPENDENT_AMBULATORY_CARE_PROVIDER_SITE_OTHER): Payer: No Typology Code available for payment source | Admitting: Medical

## 2019-02-24 VITALS — HR 79 | Temp 97.6°F | Resp 18 | Ht 68.0 in | Wt 279.0 lb

## 2019-02-24 DIAGNOSIS — J181 Lobar pneumonia, unspecified organism: Secondary | ICD-10-CM

## 2019-02-24 NOTE — Progress Notes (Signed)
Subjective:    Patient ID: Allen Lane is a 53 y.o. male.    HPI  Patient presents for 48-hour follow-up for right lower lobe pneumonia.  He reports his shortness of breath his cough and his sinus bubbling and pressure are improved.  He is taking doxycycline as prescribed.  The results of his COVID test are pending.    Review of Systems   Constitutional: Negative for fever.   Cardiovascular: Negative for chest pain.   Gastrointestinal: Negative for diarrhea and vomiting.   Skin: Negative for rash.   Neurological: Negative for weakness and numbness.           Objective:    Physical Exam  Constitutional:       General: He is not in acute distress.     Appearance: He is not ill-appearing, toxic-appearing or diaphoretic.   HENT:      Right Ear: External ear normal. No drainage, swelling or tenderness. No mastoid tenderness.      Left Ear: External ear normal. No drainage, swelling or tenderness. No mastoid tenderness.      Nose:      Right Sinus: No maxillary sinus tenderness or frontal sinus tenderness.      Left Sinus: No maxillary sinus tenderness or frontal sinus tenderness.   Cardiovascular:      Rate and Rhythm: Normal rate and regular rhythm.      Heart sounds: No murmur.   Pulmonary:      Effort: Pulmonary effort is normal. No respiratory distress.      Breath sounds: No stridor. No wheezing, rhonchi or rales.   Lymphadenopathy:      Cervical: No cervical adenopathy.   Neurological:      Mental Status: He is alert.             Assessment:       1. Lobar pneumonia          Plan:       Improving after 48 hours of doxycycline. COVID-19 testing pending. Continue to isolate at home, will call with results and arrange phone follow up. Expect resolution in 4-6 weeks. Consider repeat CXR in 4-6 weeks.

## 2019-02-25 LAB — VH SARS COV 2 RNA: SARS-CoV-2 RNA (COVID-19) Qualitative NAAT: NOT DETECTED

## 2019-02-26 ENCOUNTER — Telehealth (INDEPENDENT_AMBULATORY_CARE_PROVIDER_SITE_OTHER): Payer: Self-pay | Admitting: Medical

## 2019-02-26 NOTE — Telephone Encounter (Signed)
Patient notified

## 2019-02-26 NOTE — Telephone Encounter (Signed)
Please let the patient know that his test for COVID-19 was negative which is good.  Though the test is not 100% sensitive this means that COVID is unlikely to be the cause of his symptoms.  If he is continuing to do better he does not need to isolate any more but should continue to do the normal precautions of social distancing and mask wearing that is still recommended for the general population at this time.    I do recommend he have a repeat x-ray in 4 weeks to make sure that the infiltrate is completely gone and that there is no underlying abnormality.  If he wants he can follow-up with Dr. Eliezer Lofts at that time or I can place the order.    If he worsens again or does not continue to get better as expected he should call.

## 2019-03-07 DIAGNOSIS — C61 Malignant neoplasm of prostate: Secondary | ICD-10-CM | POA: Diagnosis not present

## 2019-04-26 ENCOUNTER — Other Ambulatory Visit: Payer: BC Managed Care – PPO

## 2019-04-26 ENCOUNTER — Other Ambulatory Visit: Payer: Self-pay

## 2019-04-26 DIAGNOSIS — Z21 Asymptomatic human immunodeficiency virus [HIV] infection status: Secondary | ICD-10-CM | POA: Diagnosis not present

## 2019-04-26 DIAGNOSIS — Z113 Encounter for screening for infections with a predominantly sexual mode of transmission: Secondary | ICD-10-CM

## 2019-04-27 ENCOUNTER — Other Ambulatory Visit (HOSPITAL_COMMUNITY)
Admission: RE | Admit: 2019-04-27 | Discharge: 2019-04-27 | Disposition: A | Discharge status: Home / Self Care | Payer: BC Managed Care – PPO | Source: Ambulatory Visit | Attending: Internal Medicine | Admitting: Internal Medicine

## 2019-04-27 DIAGNOSIS — Z21 Asymptomatic human immunodeficiency virus [HIV] infection status: Secondary | ICD-10-CM | POA: Diagnosis not present

## 2019-04-27 DIAGNOSIS — Z113 Encounter for screening for infections with a predominantly sexual mode of transmission: Secondary | ICD-10-CM | POA: Diagnosis not present

## 2019-04-27 LAB — T-HELPER CELL (CD4) - (RCID CLINIC ONLY)
CD4 % Helper T Cell: 22 % — ABNORMAL LOW (ref 33–65)
CD4 T Cell Abs: 153 /uL — ABNORMAL LOW (ref 400–1790)

## 2019-04-27 NOTE — Addendum Note (Signed)
Addended byMeriel Pica F on: 04/27/2019 03:32 PM   Modules accepted: Orders

## 2019-04-29 LAB — URINE CYTOLOGY ANCILLARY ONLY
Chlamydia: NEGATIVE
Neisseria Gonorrhea: NEGATIVE

## 2019-04-30 LAB — CBC WITH DIFFERENTIAL/PLATELET
Absolute Monocytes: 248 cells/uL (ref 200–950)
Basophils Absolute: 20 cells/uL (ref 0–200)
Basophils Relative: 0.5 %
Eosinophils Absolute: 68 cells/uL (ref 15–500)
Eosinophils Relative: 1.7 %
HCT: 34.1 % — ABNORMAL LOW (ref 38.5–50.0)
Hemoglobin: 11.9 g/dL — ABNORMAL LOW (ref 13.2–17.1)
Lymphs Abs: 796 cells/uL — ABNORMAL LOW (ref 850–3900)
MCH: 31.5 pg (ref 27.0–33.0)
MCHC: 34.9 g/dL (ref 32.0–36.0)
MCV: 90.2 fL (ref 80.0–100.0)
MPV: 10.5 fL (ref 7.5–12.5)
Monocytes Relative: 6.2 %
Neutro Abs: 2868 cells/uL (ref 1500–7800)
Neutrophils Relative %: 71.7 %
Platelets: 262 10*3/uL (ref 140–400)
RBC: 3.78 10*6/uL — ABNORMAL LOW (ref 4.20–5.80)
RDW: 13.2 % (ref 11.0–15.0)
Total Lymphocyte: 19.9 %
WBC: 4 10*3/uL (ref 3.8–10.8)

## 2019-04-30 LAB — COMPLETE METABOLIC PANEL WITH GFR
AG Ratio: 1.7 (calc) (ref 1.0–2.5)
ALT: 42 U/L (ref 9–46)
AST: 24 U/L (ref 10–35)
Albumin: 4.5 g/dL (ref 3.6–5.1)
Alkaline phosphatase (APISO): 117 U/L (ref 35–144)
BUN: 10 mg/dL (ref 7–25)
CO2: 30 mmol/L (ref 20–32)
Calcium: 9.8 mg/dL (ref 8.6–10.3)
Chloride: 104 mmol/L (ref 98–110)
Creat: 1.07 mg/dL (ref 0.70–1.33)
GFR, Est African American: 91 mL/min/{1.73_m2} (ref >=60)
GFR, Est Non African American: 79 mL/min/{1.73_m2} (ref >=60)
Globulin: 2.7 g/dL (calc) (ref 1.9–3.7)
Glucose, Bld: 112 mg/dL — ABNORMAL HIGH (ref 65–99)
Potassium: 4.1 mmol/L (ref 3.5–5.3)
Sodium: 140 mmol/L (ref 135–146)
Total Bilirubin: 0.4 mg/dL (ref 0.2–1.2)
Total Protein: 7.2 g/dL (ref 6.1–8.1)

## 2019-04-30 LAB — FLUORESCENT TREPONEMAL AB(FTA)-IGG-BLD: Fluorescent Treponemal ABS: REACTIVE — AB

## 2019-04-30 LAB — RPR TITER: RPR Titer: 1:2 {titer} — ABNORMAL HIGH

## 2019-04-30 LAB — RPR: RPR Ser Ql: REACTIVE — AB

## 2019-04-30 LAB — HIV-1 RNA QUANT-NO REFLEX-BLD
HIV 1 RNA Quant: <20 copies/mL
HIV-1 RNA Quant, Log: <1.3 Log copies/mL

## 2019-05-11 ENCOUNTER — Other Ambulatory Visit: Payer: Self-pay

## 2019-05-11 ENCOUNTER — Encounter: Payer: Self-pay | Admitting: Internal Medicine

## 2019-05-11 ENCOUNTER — Ambulatory Visit (INDEPENDENT_AMBULATORY_CARE_PROVIDER_SITE_OTHER): Payer: BC Managed Care – PPO | Admitting: Internal Medicine

## 2019-05-11 VITALS — BP 124/58 | HR 101 | Temp 99.5°F

## 2019-05-11 DIAGNOSIS — Z5181 Encounter for therapeutic drug level monitoring: Secondary | ICD-10-CM

## 2019-05-11 DIAGNOSIS — Z21 Asymptomatic human immunodeficiency virus [HIV] infection status: Secondary | ICD-10-CM

## 2019-05-11 DIAGNOSIS — Z23 Encounter for immunization: Secondary | ICD-10-CM | POA: Diagnosis not present

## 2019-05-11 NOTE — Progress Notes (Signed)
   Subjective:    Patient ID: Travis Wheeler, male    DOB: 08/25/1966, 53 y.o.   MRN: [id number]  HPI Here for follow up of HIV. We last saw him last year and he had a recent PE at that time, was on Eliquis.  He was changed to [facility] and has been on this since.  He takes daily with no missed doses.  No concerns with the medication and feels well.  No associated rash, diarrhea.  Has undergone radiation treatments for his prostate cancer and finished in March.  CD4 153 and viral load remains < 20.  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       Review of Systems  Constitutional: Negative for fatigue.  Gastrointestinal: Negative for diarrhea.  Skin: Negative for rash.       Objective:   Physical Exam Constitutional:      General: He is not in acute distress.    Appearance: He is well-developed.  HENT:     Mouth/Throat:     Pharynx: No oropharyngeal exudate.  Eyes:     General: No scleral icterus. Cardiovascular:     Rate and Rhythm: Normal rate and regular rhythm.     Heart sounds: Normal heart sounds. No murmur.  Pulmonary:     Effort: Pulmonary effort is normal. No respiratory distress.     Breath sounds: Normal breath sounds.  Lymphadenopathy:     Cervical: No cervical adenopathy.  Skin:    Findings: No rash.    SH: no tobacco       Assessment & Plan:

## 2019-05-12 DIAGNOSIS — Z23 Encounter for immunization: Secondary | ICD-10-CM | POA: Insufficient documentation

## 2019-05-12 DIAGNOSIS — Z5181 Encounter for therapeutic drug level monitoring: Secondary | ICD-10-CM | POA: Insufficient documentation

## 2019-05-12 NOTE — Assessment & Plan Note (Signed)
Doing well, no issues.  With his low CD4 will recheck it in 6 months and do an Evisit with the lab results.

## 2019-05-12 NOTE — Assessment & Plan Note (Signed)
Pneumococcal and Menveo #1 today

## 2019-05-12 NOTE — Assessment & Plan Note (Signed)
Creat wnl.

## 2019-06-14 DIAGNOSIS — C61 Malignant neoplasm of prostate: Secondary | ICD-10-CM | POA: Diagnosis not present

## 2019-06-20 ENCOUNTER — Encounter: Payer: Self-pay | Admitting: *Deleted

## 2019-07-04 DIAGNOSIS — Z20828 Contact with and (suspected) exposure to other viral communicable diseases: Secondary | ICD-10-CM | POA: Diagnosis not present

## 2019-07-06 DIAGNOSIS — R05 Cough: Secondary | ICD-10-CM | POA: Diagnosis not present

## 2019-07-06 DIAGNOSIS — Z03818 Encounter for observation for suspected exposure to other biological agents ruled out: Secondary | ICD-10-CM | POA: Diagnosis not present

## 2019-07-06 DIAGNOSIS — Z20828 Contact with and (suspected) exposure to other viral communicable diseases: Secondary | ICD-10-CM | POA: Diagnosis not present

## 2019-07-25 DIAGNOSIS — Z20828 Contact with and (suspected) exposure to other viral communicable diseases: Secondary | ICD-10-CM | POA: Diagnosis not present

## 2019-08-11 ENCOUNTER — Encounter (INDEPENDENT_AMBULATORY_CARE_PROVIDER_SITE_OTHER): Payer: Self-pay

## 2019-08-11 ENCOUNTER — Telehealth (INDEPENDENT_AMBULATORY_CARE_PROVIDER_SITE_OTHER): Payer: No Typology Code available for payment source | Admitting: Physician Assistant

## 2019-08-11 VITALS — Ht 69.0 in | Wt 308.0 lb

## 2019-08-11 DIAGNOSIS — R0602 Shortness of breath: Secondary | ICD-10-CM

## 2019-08-11 DIAGNOSIS — R059 Cough, unspecified: Secondary | ICD-10-CM

## 2019-08-11 DIAGNOSIS — Z20822 Contact with and (suspected) exposure to covid-19: Secondary | ICD-10-CM

## 2019-08-11 DIAGNOSIS — Z20828 Contact with and (suspected) exposure to other viral communicable diseases: Secondary | ICD-10-CM

## 2019-08-11 LAB — POCT INFLUENZA A/B
POCT Rapid Influenza A AG: NEGATIVE
POCT Rapid Influenza B AG: NEGATIVE

## 2019-08-11 LAB — POCT RAPID STREP A: Rapid Strep A Screen POCT: NEGATIVE

## 2019-08-11 LAB — VH AMB POCT SOFIA (TM)SARS CORONAVIRUS ANTIGEN FIA: Sofia SARS-CoV-2 Ag POCT: NEGATIVE

## 2019-08-11 MED ORDER — ALBUTEROL SULFATE HFA 108 (90 BASE) MCG/ACT IN AERS
2.0000 | INHALATION_SPRAY | RESPIRATORY_TRACT | 0 refills | Status: AC | PRN
Start: 2019-08-11 — End: 2020-08-10

## 2019-08-11 NOTE — Progress Notes (Signed)
This visit was conducted with the use of interactive audio/video telecommunication that permitted real-time communication between the patient and myself.  Patient consented to participation and receives services at patient's home, while I was located at [facility]. The patient was evaluated using telehealth platform.  Therefore, I did not lay hands on this patient.  Exam findings are from visual observation and/or having patient perform various physical exam tasks.  Verbal consent for evaluation was obtained.    Subjective:    Patient ID: Allen Lane is a 53 y.o. male.    Pt presents today with complaint of x 3 days. Pt c/o shortness of breath with exertion, chest tightness, nonproductive, congestion, runny nose. Pt works from home. His wife works at a plant with several individuals and has experienced similar symptoms x 2-3 days.     Cough  This is a new problem. Episode onset: 3 days ago. The problem has been gradually worsening. The cough is non-productive. Associated symptoms include chest pain, nasal congestion, postnasal drip, rhinorrhea and shortness of breath (with exertion). Pertinent negatives include no chills, ear congestion, ear pain, fever, heartburn, hemoptysis, myalgias, rash, sore throat, sweats, weight loss or wheezing. Nothing aggravates the symptoms. He has tried rest for the symptoms. The treatment provided mild relief. htn, hyperlipidemia     The following portions of the patient's history were reviewed and updated as appropriate: allergies, current medications, past family history, past medical history, past social history, past surgical history and problem list.    Review of Systems   Constitutional: Positive for fatigue. Negative for chills, fever and weight loss.   HENT: Positive for congestion, postnasal drip and rhinorrhea. Negative for ear pain and sore throat.    Eyes: Negative for visual disturbance.   Respiratory: Positive for cough and shortness of breath  (with exertion). Negative for hemoptysis and wheezing.    Cardiovascular: Positive for chest pain.   Gastrointestinal: Negative for heartburn, nausea and vomiting.   Genitourinary: Negative for decreased urine volume.   Musculoskeletal: Negative for myalgias.   Skin: Negative for rash.   Neurological: Negative for facial asymmetry.   Hematological: Negative for adenopathy.         Objective:    Ht 1.753 m (5\' 9" )    Wt 139.7 kg (308 lb) Comment: stated   BMI 45.48 kg/m     Physical Exam   Gen: normal appearance, no acute distress  HEENT: atraumatic, normocephalic, normal external ear, no tragus tenderness (patient self-exam)   Moist oral mucosa, no erythema and no exudate   No Sinus tenderness (patient self-exam)  Eyes: normal conjunctiva  Neck: Supple, no lymphadenopathy (patient self-exam)  Respiratory: normal effort, no tachypnea, can speak full sentence, no wheeze or cough on forced expiration        Cardiovascular: regular rhythm (patient self-exam)  Abdomen: soft, non-tender (patient self-exam), no CVA tenderness (patient self-exam)  Musculoskeletal: no visible swelling, no erythema, no warm to touch (patient self-exam), no tenderness (patient self-exam), normal ROM of the joint  Skin: no rash, no ecchymosis   Neurological: alert, oriented x3, no focal deficit noted, normal gait  Psychiatric/behavioral:  normal mood and normal thought content        Assessment and Plan:       Allen Lane was seen today for cough.    Diagnoses and all orders for this visit:    COVID-19    Cough  -     POCT INFLUENZA A/B  -  [facility] SARS Coronavirus Antigen FIA POCT  -     POCT Rapid Group A Strep    Shortness of breath  -     POCT INFLUENZA A/B  -     [facility] SARS Coronavirus Antigen FIA POCT  -     POCT Rapid Group A Strep    Exposure to COVID-19 virus  -     POCT INFLUENZA A/B  -     [facility] SARS Coronavirus Antigen FIA POCT  -     POCT Rapid Group A Strep    Pt recommended to get tested for COVID-19, strep, and influenza.  Orders placed. Discussed possibility of false negative and importance of self isolation if symptomatic. Tylenol as needed for pain/fever PRN. Discussed need to go to ED with any shortness of breath, uncontrolled fevers, O2 less than 95%, or other concerning symptoms. Albuterol as prescribed for cough or shortness of breath.     Results     Procedure Component Value Units Date/Time    POCT Rapid Group A Strep [[id number]]  (Normal) Collected: 08/11/19 1840    Specimen: Throat Updated: 08/11/19 1903     POCT QC Pass     Rapid Strep A Screen POCT Negative      Comment Negative Results should be confirmed by throat Cx to confirm absence of Strep A inf.    [facility] SARS Coronavirus Antigen FIA POCT [[id number]]  (Normal) Collected: 08/11/19 1840    Specimen: Nasal Swab COVID-19 Updated: 08/11/19 1903     [facility] SARS-CoV-2 Ag POCT Negative    POCT INFLUENZA A/B [[id number]]  (Normal) Collected: 08/11/19 1840    Specimen: Nares Updated: 08/11/19 1902     POCT QC Pass     POCT Rapid Influenza A AG Negative     POCT Rapid Influenza B AG Negative          -Follow up with PCP.  Patient/guardian are in agreement with the plan.         Vickki Hearing, PA  [facility]  08/11/2019  6:13 PM

## 2019-08-22 DIAGNOSIS — Z20828 Contact with and (suspected) exposure to other viral communicable diseases: Secondary | ICD-10-CM | POA: Diagnosis not present

## 2019-08-22 DIAGNOSIS — Z03818 Encounter for observation for suspected exposure to other biological agents ruled out: Secondary | ICD-10-CM | POA: Diagnosis not present

## 2019-09-05 DIAGNOSIS — C61 Malignant neoplasm of prostate: Secondary | ICD-10-CM | POA: Diagnosis not present

## 2019-09-07 DIAGNOSIS — Z03818 Encounter for observation for suspected exposure to other biological agents ruled out: Secondary | ICD-10-CM | POA: Diagnosis not present

## 2019-09-08 DIAGNOSIS — Z20818 Contact with and (suspected) exposure to other bacterial communicable diseases: Secondary | ICD-10-CM | POA: Diagnosis not present

## 2019-09-09 ENCOUNTER — Encounter: Payer: Self-pay | Admitting: *Deleted

## 2019-09-19 ENCOUNTER — Other Ambulatory Visit: Payer: Self-pay | Admitting: Internal Medicine

## 2019-09-19 DIAGNOSIS — C61 Malignant neoplasm of prostate: Secondary | ICD-10-CM | POA: Diagnosis not present

## 2019-09-19 DIAGNOSIS — B20 Human immunodeficiency virus [HIV] disease: Secondary | ICD-10-CM

## 2019-09-20 ENCOUNTER — Other Ambulatory Visit: Payer: Self-pay | Admitting: Urology

## 2019-09-20 DIAGNOSIS — C61 Malignant neoplasm of prostate: Secondary | ICD-10-CM

## 2019-09-20 DIAGNOSIS — M8580 Other specified disorders of bone density and structure, unspecified site: Secondary | ICD-10-CM

## 2019-09-21 DIAGNOSIS — M25539 Pain in unspecified wrist: Secondary | ICD-10-CM | POA: Diagnosis not present

## 2019-09-21 DIAGNOSIS — M79673 Pain in unspecified foot: Secondary | ICD-10-CM | POA: Diagnosis not present

## 2019-09-21 DIAGNOSIS — M25569 Pain in unspecified knee: Secondary | ICD-10-CM | POA: Diagnosis not present

## 2019-10-05 DIAGNOSIS — M722 Plantar fascial fibromatosis: Secondary | ICD-10-CM | POA: Diagnosis not present

## 2019-10-05 DIAGNOSIS — M25562 Pain in left knee: Secondary | ICD-10-CM | POA: Diagnosis not present

## 2019-10-05 DIAGNOSIS — M654 Radial styloid tenosynovitis [de Quervain]: Secondary | ICD-10-CM | POA: Diagnosis not present

## 2019-10-05 DIAGNOSIS — M25561 Pain in right knee: Secondary | ICD-10-CM | POA: Diagnosis not present

## 2019-11-14 ENCOUNTER — Other Ambulatory Visit: Payer: BC Managed Care – PPO

## 2019-12-12 ENCOUNTER — Encounter: Payer: BC Managed Care – PPO | Admitting: Internal Medicine

## 2019-12-19 ENCOUNTER — Other Ambulatory Visit: Payer: BC Managed Care – PPO

## 2019-12-19 ENCOUNTER — Other Ambulatory Visit: Payer: Self-pay

## 2019-12-19 DIAGNOSIS — Z21 Asymptomatic human immunodeficiency virus [HIV] infection status: Secondary | ICD-10-CM

## 2019-12-20 LAB — T-HELPER CELL (CD4) - (RCID CLINIC ONLY)
CD4 % Helper T Cell: 24 % — ABNORMAL LOW (ref 33–65)
CD4 T Cell Abs: 214 /uL — ABNORMAL LOW (ref 400–1790)

## 2019-12-21 LAB — HIV-1 RNA QUANT-NO REFLEX-BLD
HIV 1 RNA Quant: <20 copies/mL
HIV-1 RNA Quant, Log: <1.3 Log copies/mL

## 2020-01-02 ENCOUNTER — Other Ambulatory Visit: Payer: Self-pay

## 2020-01-02 ENCOUNTER — Encounter: Payer: Self-pay | Admitting: Internal Medicine

## 2020-01-02 ENCOUNTER — Ambulatory Visit (INDEPENDENT_AMBULATORY_CARE_PROVIDER_SITE_OTHER): Payer: BC Managed Care – PPO | Admitting: Internal Medicine

## 2020-01-02 DIAGNOSIS — Z113 Encounter for screening for infections with a predominantly sexual mode of transmission: Secondary | ICD-10-CM

## 2020-01-02 DIAGNOSIS — Z5181 Encounter for therapeutic drug level monitoring: Secondary | ICD-10-CM

## 2020-01-02 DIAGNOSIS — Z21 Asymptomatic human immunodeficiency virus [HIV] infection status: Secondary | ICD-10-CM | POA: Diagnosis not present

## 2020-01-04 ENCOUNTER — Encounter: Payer: Self-pay | Admitting: Internal Medicine

## 2020-01-04 DIAGNOSIS — Z113 Encounter for screening for infections with a predominantly sexual mode of transmission: Secondary | ICD-10-CM | POA: Insufficient documentation

## 2020-01-04 NOTE — Assessment & Plan Note (Addendum)
Doing great, no issues, will follow up in 6 months.  12 minutes spent on the visit.

## 2020-01-04 NOTE — Progress Notes (Signed)
I connected with  Travis Wheeler on 01/04/20 by phone and verified that I am speaking with the correct person using two identifiers.   I discussed the limitations of evaluation and management by telemedicine. The patient expressed understanding and agreed to proceed.  HPI: he continues on Biktarvy and no missed doses.  CD4 of 214, viral load < 20.  No new complaints.  Getting his second COVID-19 vaccine the day of this visit.   No new issues.

## 2020-03-01 ENCOUNTER — Emergency Department
Admission: EM | Admit: 2020-03-01 | Discharge: 2020-03-02 | Disposition: A | Discharge status: Home / Self Care | Payer: No Typology Code available for payment source | Attending: Emergency Medicine | Admitting: Emergency Medicine

## 2020-03-01 ENCOUNTER — Emergency Department: Payer: No Typology Code available for payment source

## 2020-03-01 DIAGNOSIS — I10 Essential (primary) hypertension: Secondary | ICD-10-CM | POA: Insufficient documentation

## 2020-03-01 DIAGNOSIS — R202 Paresthesia of skin: Secondary | ICD-10-CM | POA: Insufficient documentation

## 2020-03-01 DIAGNOSIS — R002 Palpitations: Secondary | ICD-10-CM | POA: Insufficient documentation

## 2020-03-01 LAB — ECG 12-LEAD
P Wave Axis: 32 deg
P-R Interval: 163 ms
Patient Age: 54 years
Q-T Interval(Corrected): 428 ms
Q-T Interval: 338 ms
QRS Axis: -47 deg
QRS Duration: 87 ms
T Axis: 39 years
Ventricular Rate: 96 //min

## 2020-03-01 LAB — CBC AND DIFFERENTIAL
Basophils %: 0.3 % (ref 0.0–3.0)
Basophils Absolute: 0 10*3/uL (ref 0.0–0.3)
Eosinophils %: 3.8 % (ref 0.0–7.0)
Eosinophils Absolute: 0.2 10*3/uL (ref 0.0–0.8)
Hematocrit: 48.1 % (ref 39.0–52.5)
Hemoglobin: 16 gm/dL (ref 13.0–17.5)
Lymphocytes Absolute: 1.8 10*3/uL (ref 0.6–5.1)
Lymphocytes: 28.8 % (ref 15.0–46.0)
MCH: 31 pg (ref 28–35)
MCHC: 33 gm/dL (ref 32–36)
MCV: 94 fL (ref 80–100)
MPV: 6.8 fL (ref 6.0–10.0)
Monocytes Absolute: 0.4 10*3/uL (ref 0.1–1.7)
Monocytes: 7.1 % (ref 3.0–15.0)
Neutrophils %: 60 % (ref 42.0–78.0)
Neutrophils Absolute: 3.8 10*3/uL (ref 1.7–8.6)
PLT CT: 225 10*3/uL (ref 130–440)
RBC: 5.13 10*6/uL (ref 4.00–5.70)
RDW: 12.4 % (ref 11.0–14.0)
WBC: 6.3 10*3/uL (ref 4.0–11.0)

## 2020-03-01 LAB — COMPREHENSIVE METABOLIC PANEL
ALT: 32 U/L (ref 0–55)
AST (SGOT): 19 U/L (ref 10–42)
Albumin/Globulin Ratio: 1.63 Ratio (ref 0.80–2.00)
Albumin: 4.4 gm/dL (ref 3.5–5.0)
Alkaline Phosphatase: 74 U/L (ref 40–145)
Anion Gap: 14 mMol/L (ref 7.0–18.0)
BUN / Creatinine Ratio: 19.2 Ratio (ref 10.0–30.0)
BUN: 15 mg/dL (ref 7–22)
Bilirubin, Total: 1.7 mg/dL — ABNORMAL HIGH (ref 0.1–1.2)
CO2: 27 mMol/L (ref 20–30)
Calcium: 9.1 mg/dL (ref 8.5–10.5)
Chloride: 102 mMol/L (ref 98–110)
Creatinine: 0.78 mg/dL — ABNORMAL LOW (ref 0.80–1.30)
EGFR: 102 mL/min/{1.73_m2} (ref 60–150)
Globulin: 2.7 gm/dL (ref 2.0–4.0)
Glucose: 135 mg/dL — ABNORMAL HIGH (ref 71–99)
Osmolality Calculated: 280 mOsm/kg (ref 275–300)
Potassium: 4 mMol/L (ref 3.5–5.3)
Protein, Total: 7.1 gm/dL (ref 6.0–8.3)
Sodium: 139 mMol/L (ref 136–147)

## 2020-03-01 LAB — TROPONIN I: Troponin I: 0.01 ng/mL (ref 0.00–0.02)

## 2020-03-01 MED ORDER — GADOTERATE MEGLUMINE 10 MMOL/20ML IV SOLN
20.00 mL | Freq: Once | INTRAVENOUS | Status: AC | PRN
Start: 2020-03-01 — End: 2020-03-01
  Administered 2020-03-01: 23:00:00 10 mmol via INTRAVENOUS

## 2020-03-01 NOTE — ED Notes (Signed)
Pt ready for MRI, MRI screen previously completed.

## 2020-03-01 NOTE — ED Triage Notes (Signed)
Pt arrived d/t elevated BP of 188/122 at 1915 while at home. Pt states he then had tingling in the right arm. Denies N/V/D or temperature

## 2020-03-01 NOTE — Discharge Instructions (Signed)
Please follow up with your PCP by Monday. Return for arm weakness, or any other new or worsening concerns.     Paraesthesias  Paraesthesia is a burning or prickling sensation that is sometimes felt in the hands, arms, legs or feet. It can also occur in other parts of the body. It can also feel like tingling or numbness, skin crawling, or itching. The feeling is not comfortable, but it is not painful. (The "pins and needles" feeling that happens when a foot or hand "falls asleep" is a temporary paraesthesia.)  Paraesthesias that last or come and go may be caused by medical issues that need to be treated. These include stroke, a bulging disk pressing on a nerve, a trapped nerve, vitamin deficiencies, uncontrolled diabetes, alcohol abuse, or even certain medicines.  Tests are often done. These tests may include blood tests, X-ray, CT (computerized tomography) scan, nerve conduction studies (NCS), or a muscle test (electromyography). Depending on the cause, treatment may include physical therapy.  Home care   Tell your healthcare provider about all medicines you take. This includes prescription and over-the-counter medicines, vitamins, and herbs. Ask if any of the medicines may be causing your problems. Don't make any changes to prescription medicines without talking to your healthcare provider first.   You may be prescribed medicines to help relieve the tingling feeling or for pain. Take all medicines as directed.   A numb hand or foot may be more prone to injury. To help protect it:  ? Always use oven mitts.  ? Test water with an unaffected hand or foot.  ? Use caution when trimming nails. File sharp areas.  ? Wear shoes that fit well to avoid pressure points, blisters, and ulcers.  ? Inspect your hands and feet carefully (including the soles of your feet and between your toes) daily. If you see red areas, sores, or other problems, tell your healthcare provider.  Follow-up care  Follow up with your doctor, or as  advised. You may need further testing or evaluation.    When to seek medical advice  Call your healthcare provider right away if any of the following occur:   Numbness or weakness of the face, one arm, or one leg   Slurred speech, confusion, trouble speaking, walking, or seeing   Severe headache, fainting spell, dizziness, or seizure   Chest, arm, neck, or upper back pain   Loss of bladder or bowel control   Open wound with redness, swelling, or pus    StayWell last reviewed this educational content on 12/28/2016   2000-2020 [employer], [place]. [address], [place], [place] [address]. All rights reserved. This information is not intended as a substitute for professional medical care. Always follow your healthcare professional's instructions.          Controlling High Blood Pressure  High blood pressure (hypertension) is often called the silent killer. This is because many people who have it, dont know it. It can be very dangerous. High blood pressure can raise your risk of heart attack, stroke, heart disease, and heart failure. Controlling your blood pressure can decrease your risk of these problems. It's important to know the appropriate blood pressure range and remember to check your blood pressure regularly. Doing so can save your life.  Blood pressure measurements are given as 2 numbers. Systolic blood pressure is the upper number. This is the pressure when the heart contracts. Diastolic blood pressure is the lower number. This is the pressure when the heart  relaxes between beats.  Blood pressure is categorized as normal, elevated, or stage 1 or stage 2 high blood pressure:   Normal blood pressure is systolic of less than 120 and diastolic of less than 80 (120/80)   Elevated blood pressure is systolic of 120 to 129 and diastolic less than 80   Stage 1 high blood pressure is systolic of 130 to 139 or diastolic between 80 to 89   Stage 2 high blood pressure is when systolic is 140 or higher or  the diastolic is 90 or higher  A heart-healthy lifestyle can help you control your blood pressure without medicines. Here are some things you can do to pursue a heart-healthy lifestyle:    Choose heart-healthy foods   Select low-salt, low-fat foods. Limit sodium intake to 2,400 mg per day or the amount suggested by your healthcare provider.   Limit canned, dried, cured, packaged, and fast foods. These can contain a lot of salt.   Eat 8 to 10 servings of fruits and vegetables every day.   Choose lean meats, fish, or chicken.   Eat whole-grain pasta, brown rice, and beans.   Eat 2 to 3 servings of low-fat or fat-free dairy products.   Ask your doctor about the DASH eating plan. This plan helps reduce blood pressure.   When you go to a restaurant, ask that your meal be prepared with no added salt.    Stay at a healthy weight   Ask your healthcare provider how many calories to eat a day. Then stick to that number.   Ask your healthcare provider what weight range is healthiest for you. If you are overweight, a weight loss of only 3% to 5% of your body weightcan help lower blood pressure. Generally, a good weight loss goal is to lose 10% of your body weight in a year.   Limit snacks and sweets.   Get regular exercise.    Get up and get active   Find activities you enjoy that can be done alone or with friends or family. Such activities might include bicycling, dancing, walking, or jogging.   Park farther away from building entrances to walk more.   Use stairs instead of the elevator.   When you can, walk or bike instead of driving.   Rake leaves, garden, or do household repairs.   Be active at a moderate to vigorous level of physical activity for at least 40 minutes for a minimum of 3 to 4 days a week.    Manage stress   Make time to relax and enjoy life. Find time to laugh.   Communicate your concerns with your loved ones and your healthcare provider.   Visit with family and friends, and keep up  with hobbies.    Limit alcohol and quit smoking   Men should have no more than 2 drinks per day.   Women should have no more than 1 drink per day.   Talk with your healthcare provider about quitting smoking. Smoking significantly increases your risk for heart disease and stroke. Ask your healthcare provider about community smoking cessation programs and other options.    Medicines  If lifestyle changes arent enough, your healthcare provider may prescribe high blood pressure medicine. Take all medicines as prescribed. If you have any questions about your medicines, ask your healthcare provider before stopping or changing them.  StayWell last reviewed this educational content on 02/27/2018   2000-2020 [employer], [place]. [address], [place], [place]  [address]. All rights reserved. This information is not intended as a substitute for professional medical care. Always follow your healthcare professional's instructions.          Discharge Instructions for High Blood Pressure (Hypertension)  You have been diagnosed with high blood pressure (hypertension). This means the force of blood against your artery walls is too strong. It also means your heart is working hard to move blood. High blood pressure usually has no symptoms, but over time, it can cause serious health problems. High blood pressure raises your risk for heart attack, stroke, heart disease, heart failure, kidney disease, and vision loss. With help from your doctor, you can manage your blood pressure and protect your health.  Blood pressure measurements are given as 2 numbers. Systolic blood pressure is the upper number. This is the pressure when the heart contracts or pumps. Diastolic blood pressure is the lower number. This is the pressure when the heart relaxes between beats.  Blood pressure is categorized as normal, elevated, or stage 1 or stage 2 high blood pressure:   Normal blood pressure is systolic of less than 120 and diastolic of less  than 80 (120/80) at rest   Elevated blood pressure is systolic of 120 to 129 and diastolic less than 80 at rest   Stage 1 high blood pressure is systolic is 130 to 139 or diastolic between 80 to 89 at rest   Stage 2 high blood pressure is when systolic is 140 or higher or the diastolic is 90 or higher at rest  Taking medicine   Learn to measure your own blood pressure. Keep a record of your results. Ask your doctor which readings mean that you need medical attention.   Take your blood pressure medicine exactly as directed. Dont skip doses. Missing dosescan cause your blood pressure to get out of control.   If you do miss a dose (or doses) check with your healthcare provider about what to do.   Don't take medicines that contain heart stimulants, including over-the-counter medicines. Check for warnings about high blood pressure on the label. Ask the pharmacist before purchasing something you haven't used before   Check with your doctor or pharmacist before taking a decongestant such as pseudoephedrine or phenylephrine. Some decongestants can worsen high blood pressure.    Lifestyle changes   Stay at a healthy weight. Get help to lose any extra pounds (kilograms). Often times meeting with a dietitian can help you identify changes that can be made to your diet to help with weight loss.   Cut back on salt.  ? Limit canned, dried, packaged, and fast foods.  ? Dont add salt to your food at the table.  ? Season foods with herbs instead of salt when you cook.  ? Request no added salt when you go to a restaurant.  ? The American Heart Association [facility]) says the ideal amount of sodiumis no more than1,500 mg aday. But becauseAmericans eat so much salt, you can make a positive change bycutting back to even 2,300 mg of sodium a day (1 teaspoon).   Follow the DASH (Dietary Approaches to Stop Hypertension) eating plan. This plan recommends vegetables, fruits, whole gains, and other heart healthy foods.   Eat  food rich in potassium.   Begin an exercise program. Ask your healthcare provider how to get started. The AHA recommends aerobic exercise 3 to 4 times a week for an average of 40 minutes at a time to lower blood pressure, with your  provider's approval. Simple activities such as walking or gardening can help.   Break the smoking habit. Enroll in a stop-smoking program to improve your chances of success. Ask your healthcare provider about programs and medicines to help you stop smoking.   Limit drinks that contain caffeine such as coffee, black or green tea, and cola to 2 per day.   Never take stimulants such as amphetamines or cocaine. These drugs can be deadly for someone with high blood pressure.   Control your stress. Learn ways to manage stress.   Limit alcohol to no more than 1 drink a day for women and 2 drinks a day for men.    Follow-up care  Make a follow-up appointment as directed.  When to call your healthcare provider  Call your healthcare provider right away if you have any of the following:   Chest pain or shortness of breath ( call 911)   Moderate to severe headache   Weakness in the muscles of your face, arms, or legs   Trouble speaking   Extreme drowsiness   Confusion   Fainting or dizziness   Pulsating or rushing sound in your ears   Unexplainednosebleed   Weakness, tingling, or numbness of your face, arms, or legs   Change in vision   Blood pressure measured at home that is greater than 180/110  StayWell last reviewed this educational content on 04/29/2018   2000-2020 [employer], [place]. [address] [place], [place] [address]. All rights reserved. This information is not intended as a substitute for professional medical care. Always follow your healthcare professional's instructions.          Understanding Heart Palpitations    Heart palpitations are the feeling you have when your heartbeat seems to be racing, pounding, skipping, or fluttering. Heart palpitations are  most often felt in the chest. Sometimes, they may also be felt in the neck.  What causes heart palpitations?  In most cases, heart palpitations are caused by:   Stress or anxiety   Exercise   Pregnancy   Some medicines   Caffeine   Nicotine   Alcohol   Illegal drugs, such as cocaine   Health problems, such as anemia or overactive thyroid  Many heart palpitations are harmless. But in some cases, palpitations may be caused by a problem with the heart such as an abnormal heart rhythm (arrhythmia). They may need to be managed by you and your healthcare provider or treated right away.  How are heart palpitations treated?  Treatments for heart palpitations depend on the cause. Options may include:   Managing the things that trigger your heart palpitations. This could mean:  ? Learning ways to reduce stress and anxiety  ? Staying away from caffeine, nicotine, alcohol, and illegal drugs  ? Stopping the use of certain medicines, under your doctors guidance   Medicines, procedures, or surgery to treat an arrhythmia or other health problem that is causing your symptoms  What are possible complications of heart palpitations?  Complications of heart palpitations are rare unless they are caused by a problem such as an arrhythmia. In such cases, complications can include:   Fainting   Heart failure. This problem occurs when the heart is so weak it no longer pumps blood well.   Blood clots and stroke   Sudden cardiac arrest. This problem occurs when the heart suddenly stops beating.  When should I call my healthcare provider?  Call your healthcare provider right away if you have  any of these:   Palpitations that prevent you from sleeping or otherwise affect your quality of life.   Symptoms that dont get better with treatment, or symptoms that get worse   New symptoms, such as chest pain, shortness of breath, dizziness, or fainting  StayWell last reviewed this educational content on 02/27/2018   2000-2020 [employer], [place]. [address] [place], [place] [address]. All rights reserved. This information is not intended as a substitute for professional medical care. Always follow your healthcare professional's instructions.

## 2020-03-01 NOTE — ED Provider Notes (Signed)
[facility]  EMERGENCY DEPARTMENT  History and Physical Exam       Patient Name: Allen Lane  Encounter Date:  03/01/2020  Attending Physician: Allen Shanielle Correll, MD  PCP: Allen Shivers, MD  Patient DOB:  06/29/1966  MRN:  [id number]  Room:  W46/W46-A      History of Presenting Illness     Chief complaint: Hypertension, Palpitations, and Numbness    HPI/ROS is limited by: none  HPI/ROS given by: Patient and Spouse    Allen Lane is a 54 y.o. male who presents with hypertension, palpitations, and numbness. Patient has a history of hypertension and vertigo. Patient states at 7:15PM, he acutely developed a headache, palpitations, burning sensation in ears, tingling in his lips, and tinnitus. He also noticed right arm felt like pins and needles and he describes it as "fiberglass". His wife made him take 2 baby aspirin at home. Wife states she checked his BP at home and it was elevated so they chose to be evaluated at the ED. Patient states his headache has gradually alleviated since coming to the ED. He says he is still has the sensation of pins and needles in his right arm. Patient currently takes atorvastatin, niacin, aspirin, and Losartan. He denies any recent changes in his medications and states he took them today. Denies chest pain or shortness of breath. No further complaints at this time.        Review of Systems     Review of Systems   Constitutional: Negative for fever.   HENT: Negative for sore throat.    Respiratory: Negative for cough and shortness of breath.    Cardiovascular: Positive for palpitations. Negative for chest pain.   Gastrointestinal: Negative for abdominal pain, diarrhea, nausea and vomiting.   Genitourinary: Negative for dysuria and frequency.   Skin: Negative for rash.   Neurological: Positive for numbness (pins and needle sensation in his right arm) and headaches.        Tingling in lips        Allergies & Medications     Pt is allergic to lisinopril.    Home Meds: EMR  link not correct; nurses' notes reviewed for meds and dosages     Past Medical History     Pt has a past medical history of Gastroesophageal reflux disease, Hyperlipidemia, and Hypertension.     Past Surgical History     Pt  has a past surgical history that includes Joint replacement and Knee arthroscopy w/ meniscal repair (Right).     Family History     The family history includes No known problems in his father and mother.     Social History     Pt reports that he has never smoked. He has never used smokeless tobacco. He reports current alcohol use. He reports that he does not use drugs.     Physical Exam     Blood pressure (!) 147/95, pulse 79, temperature 97.1 F (36.2 C), temperature source Tympanic, resp. rate 16, SpO2 97 %.    Physical Exam  Vitals and nursing note reviewed.   Constitutional:       General: He is not in acute distress.     Appearance: He is well-developed. He is not ill-appearing, toxic-appearing or diaphoretic.   HENT:      Head: Normocephalic and atraumatic.      Mouth/Throat:      Mouth: Mucous membranes are moist.   Cardiovascular:      Rate  and Rhythm: Normal rate and regular rhythm.      Heart sounds: Normal heart sounds. No murmur. No friction rub. No gallop.    Pulmonary:      Effort: Pulmonary effort is normal. No respiratory distress.      Breath sounds: Normal breath sounds. No wheezing or rales.   Abdominal:      General: There is no distension.      Palpations: Abdomen is soft.      Tenderness: There is no abdominal tenderness. There is no guarding.   Musculoskeletal:      Cervical back: Normal range of motion.   Skin:     General: Skin is warm and dry.      Capillary Refill: Capillary refill takes less than 2 seconds.   Neurological:      General: No focal deficit present.      Mental Status: He is alert.      Comments: Negative pronator drift. Normal strength and sensation in bilateral upper and lower extremities.    Psychiatric:         Mood and Affect: Mood normal.             Diagnostic Results     The results of the diagnostic studies below have been reviewed by myself:    Labs  Labs Reviewed   COMPREHENSIVE METABOLIC PANEL - Abnormal; Notable for the following components:       Result Value    Glucose 135 (*)     Creatinine 0.78 (*)     Bilirubin, Total 1.7 (*)     All other components within normal limits   CBC AND DIFFERENTIAL   TROPONIN I       Radiologic Studies  MR Angiogram Head WO Contrast    Result Date: 03/01/2020  Normal MRA scan of the head. ReadingStation:[id number]    MR Angiogram Neck W WO Contrast    Result Date: 03/01/2020  Normal MRA scan of the neck. ReadingStation:[id number]    MRI Brain WO Contrast    Result Date: 03/01/2020  IMPRESSION: Normal MRI scan of the head. ReadingStation:[id number]      EKG:   EKG Results     Procedure Component Value Units Date/Time    ECG 12 lead [[id number]] Collected: 03/01/20 2015     Updated: 03/01/20 2052     Patient Age 23 years      Patient DOB Apr 25, 1966     Patient Height --     Patient Weight --     Interpretation Text --     Sinus rhythm  Probable left atrial enlargement  Inferior infarct, old  Consider anterior infarct  Compared to ECG 04/08/2017 20:07:39  ST (T wave) deviation no longer present  Myocardial infarct finding still present  Electronically Signed On 03-01-2020 20:50:39 EDT by Allen Cindia Hustead       Physician Interpreter Allen Joshue Badal     Ventricular Rate 96 //min      QRS Duration 87 ms      P-R Interval 163 ms      Q-T Interval 338 ms      Q-T Interval(Corrected) 428 ms      P Wave Axis 32 deg      QRS Axis -47 deg      T Axis 39 years            ED Course & Treatment     D/dx, workup, anticipated clinical course discussed with patient/family.  Results reviewed with patient/family.  Patient  appreciative of care.  All questions answered.  Patient/family comfortable with treatment plan.          11:40PM: All symptoms have now resolved.  Patient looks well.  Blood pressure is within normal limits.  Results  explained.  All questions answered.  Pt is comfortable w/ disposition.      54 year old male presents for high blood pressure, palpitations, and paresthesias of the right arm that started after a stressful conversation at work.  Nothing objective on exam.  His work-up in the emergency department is negative.  His blood pressure was initially high, but improved spontaneously while in the emergency department.  I suspect anxiety is the etiology of his symptoms and he thinks this is quite possible.  Symptoms did resolve in the emergency department.  He was discharged home with recommendation for outpatient follow-up.  Medical Decision Making     DDx: CVA, TIA, stress reaction, electrolyte imbalance, hypertensive emergency, doubt MI, doubt arrhythmia    This chart was generated by an EMR, and parts may have been dictated through an electronic transcription program, and so this chart may contain errors or omissions not intended by the user.     Procedures / Critical Care     None     Diagnosis / Disposition     Clinical Impression  1. Paresthesias    2. Palpitations    3. Hypertension, unspecified type        Disposition  ED Disposition     ED Disposition Condition Date/Time Comment    Discharge  Thu Mar 01, 2020 11:54 PM Valarie Cones discharge to home/self care.    Condition at disposition: Stable          Follow up for Discharged Patients  Allen Shivers, MD  [address]  [place] [address]  [phone]    In 4 days  Follow up with your PCP by Monday of next week    [facility]  [address]  [place] [address]  [phone]  Go to  As needed, If symptoms worsen      Prescriptions for Discharged Patients  Discharge Medication List as of 03/01/2020 11:54 PM          Max Santiago Glad was my scribe today and assisted me with documentation only.  He was present during the questioning and physical examination of this patient and documented what he observed and was instructed to document.  This  note accurately reflects work and decisions made by me.                Christalyn Goertz, Allen B, MD  03/02/20 [phone]

## 2020-03-01 NOTE — ED Notes (Signed)
MD at bedside. 

## 2020-03-05 ENCOUNTER — Other Ambulatory Visit: Payer: BC Managed Care – PPO

## 2020-03-15 DIAGNOSIS — C61 Malignant neoplasm of prostate: Secondary | ICD-10-CM | POA: Diagnosis not present

## 2020-03-22 ENCOUNTER — Other Ambulatory Visit: Payer: Self-pay | Admitting: Infectious Diseases

## 2020-03-22 DIAGNOSIS — B20 Human immunodeficiency virus [HIV] disease: Secondary | ICD-10-CM

## 2020-03-22 DIAGNOSIS — C61 Malignant neoplasm of prostate: Secondary | ICD-10-CM | POA: Diagnosis not present

## 2020-05-31 ENCOUNTER — Telehealth: Payer: Self-pay | Admitting: Radiation Oncology

## 2020-05-31 ENCOUNTER — Encounter: Payer: Self-pay | Admitting: Radiation Oncology

## 2020-05-31 NOTE — Telephone Encounter (Signed)
Phoned patient back. No answer. Left voicemail message. Explained I received his request for a letter related to working around chemicals and biochemical recurrence of prostate cancer. Explained letter has been written and will be mailed to his home. Encouraged patient to phone with future needs or concerns.

## 2020-06-08 ENCOUNTER — Telehealth: Payer: Self-pay | Admitting: Radiation Oncology

## 2020-06-08 NOTE — Telephone Encounter (Signed)
Placed completed [facility] request for accomodation paperwork in Ashlyn Bruning, PA-C inbox to sign. Deadline of 06/15/2020 noted. Paperwork received after 4 pm yesterday.

## 2020-06-08 NOTE — Telephone Encounter (Signed)
Faxed completed and SIGNED [facility] request for accomodation paperwork to [phone]. Fax confirmation of delivery obtained. Original paperwork placed in box in nursing to be scanned in.

## 2020-07-20 ENCOUNTER — Telehealth: Payer: Self-pay | Admitting: *Deleted

## 2020-07-20 NOTE — Telephone Encounter (Signed)
Facsimile received.  Debbe [employer]de[employer] employer asking for ADA clarification to determine if there is a work environment that can accommodate him.   1. Clarify meaning regarding "Chemicals with estrogenic properties.".  CHemicals throughout facility and from plastics definition there would be exposure from plastics and many other items that contain estrogenic properties."  2. Confirm if use/exposure to the previously inquired chemicals ar within reason: [employer] [address] [employer] 500 B-4 Fire Tour manager Fluid Isopropyl Alcohol 99% Petroleum Ether - 3560C Aeroshield Fluid 4 (MSDS#58100E-9)

## 2020-07-23 ENCOUNTER — Encounter: Payer: Self-pay | Admitting: Radiation Oncology

## 2020-08-09 DIAGNOSIS — F419 Anxiety disorder, unspecified: Secondary | ICD-10-CM | POA: Diagnosis not present

## 2020-08-09 DIAGNOSIS — R0789 Other chest pain: Secondary | ICD-10-CM | POA: Diagnosis not present

## 2020-09-05 ENCOUNTER — Other Ambulatory Visit
Admission: RE | Admit: 2020-09-05 | Discharge: 2020-09-05 | Disposition: A | Discharge status: Home / Self Care | Payer: No Typology Code available for payment source | Source: Ambulatory Visit | Attending: General Practice | Admitting: General Practice

## 2020-09-05 LAB — CBC AND DIFFERENTIAL
Basophils %: 0.8 % (ref 0.0–3.0)
Basophils Absolute: 0.1 10*3/uL (ref 0.0–0.3)
Eosinophils %: 4 % (ref 0.0–7.0)
Eosinophils Absolute: 0.2 10*3/uL (ref 0.0–0.8)
Hematocrit: 47.4 % (ref 39.0–52.5)
Hemoglobin: 15.9 gm/dL (ref 13.0–17.5)
Lymphocytes Absolute: 2 10*3/uL (ref 0.6–5.1)
Lymphocytes: 33.3 % (ref 15.0–46.0)
MCH: 32 pg (ref 28–35)
MCHC: 34 gm/dL (ref 32–36)
MCV: 96 fL (ref 80–100)
MPV: 7.1 fL (ref 6.0–10.0)
Monocytes Absolute: 0.4 10*3/uL (ref 0.1–1.7)
Monocytes: 6.9 % (ref 3.0–15.0)
Neutrophils %: 54.9 % (ref 42.0–78.0)
Neutrophils Absolute: 3.3 10*3/uL (ref 1.7–8.6)
PLT CT: 229 10*3/uL (ref 130–440)
RBC: 4.94 10*6/uL (ref 4.00–5.70)
RDW: 12.6 % (ref 11.0–14.0)
WBC: 6 10*3/uL (ref 4.0–11.0)

## 2020-09-05 LAB — COMPREHENSIVE METABOLIC PANEL
ALT: 27 U/L (ref 0–55)
AST (SGOT): 17 U/L (ref 10–42)
Albumin/Globulin Ratio: 1.33 Ratio (ref 0.80–2.00)
Albumin: 4 gm/dL (ref 3.5–5.0)
Alkaline Phosphatase: 73 U/L (ref 40–145)
Anion Gap: 14.5 mMol/L (ref 7.0–18.0)
BUN / Creatinine Ratio: 14.8 Ratio (ref 10.0–30.0)
BUN: 13 mg/dL (ref 7–22)
Bilirubin, Total: 1.1 mg/dL (ref 0.1–1.2)
CO2: 27 mMol/L (ref 20–30)
Calcium: 9.9 mg/dL (ref 8.5–10.5)
Chloride: 103 mMol/L (ref 98–110)
Creatinine: 0.88 mg/dL (ref 0.80–1.30)
EGFR: 97 mL/min/{1.73_m2} (ref 60–150)
Globulin: 3 gm/dL (ref 2.0–4.0)
Glucose: 102 mg/dL — ABNORMAL HIGH (ref 71–99)
Osmolality Calculated: 280 mOsm/kg (ref 275–300)
Potassium: 4.5 mMol/L (ref 3.5–5.3)
Protein, Total: 7 gm/dL (ref 6.0–8.3)
Sodium: 140 mMol/L (ref 136–147)

## 2020-09-05 LAB — C-REACTIVE PROTEIN: C-Reactive Protein: 0.18 mg/dL (ref 0.02–0.80)

## 2020-09-05 LAB — LIPID PANEL
Cholesterol: 149 mg/dL (ref 75–199)
Coronary Heart Disease Risk: 4.14
HDL: 36 mg/dL — ABNORMAL LOW (ref 40–55)
LDL Calculated: 89 mg/dL
Triglycerides: 118 mg/dL (ref 10–150)
VLDL: 24 (ref 0–40)

## 2020-09-05 LAB — TSH: TSH: 1.42 u[IU]/mL (ref 0.40–4.20)

## 2020-09-05 LAB — HEPATITIS B SURF AB QUALITATIVE: HEPATITIS B SURFACE ANTIBODY: NONREACTIVE

## 2020-09-05 LAB — HIV AG/AB 4TH GENERATION: HIV Ag/Ab, 4th Generation: NONREACTIVE

## 2020-09-05 LAB — HEMOGLOBIN A1C: Hgb A1C, %: 5.6 %

## 2020-09-06 LAB — LYME AB, TOTAL,REFLEX TO WESTERN BLOT (IGG & IGM): Lyme AB: NONREACTIVE

## 2020-09-06 LAB — VITAMIN D,25 OH,TOTAL: Vitamin D 25-Hydroxy: 14.4 ng/mL — ABNORMAL LOW (ref 30.0–80.0)

## 2020-09-06 LAB — VH STD AMPLIFIED DNA PROBE
Chlamydia trachomatis: NEGATIVE
Neisseria gonorrhoeae: NEGATIVE

## 2020-09-07 DIAGNOSIS — R7989 Other specified abnormal findings of blood chemistry: Secondary | ICD-10-CM | POA: Diagnosis not present

## 2020-09-07 DIAGNOSIS — F419 Anxiety disorder, unspecified: Secondary | ICD-10-CM | POA: Diagnosis not present

## 2020-09-12 ENCOUNTER — Other Ambulatory Visit: Payer: Self-pay | Admitting: Infectious Diseases

## 2020-09-12 DIAGNOSIS — B20 Human immunodeficiency virus [HIV] disease: Secondary | ICD-10-CM

## 2020-09-17 DIAGNOSIS — C61 Malignant neoplasm of prostate: Secondary | ICD-10-CM | POA: Diagnosis not present

## 2020-09-20 DIAGNOSIS — N3946 Mixed incontinence: Secondary | ICD-10-CM | POA: Diagnosis not present

## 2020-09-20 DIAGNOSIS — C61 Malignant neoplasm of prostate: Secondary | ICD-10-CM | POA: Diagnosis not present

## 2020-10-01 ENCOUNTER — Other Ambulatory Visit: Payer: Self-pay

## 2020-10-01 ENCOUNTER — Other Ambulatory Visit (HOSPITAL_COMMUNITY)
Admission: RE | Admit: 2020-10-01 | Discharge: 2020-10-01 | Disposition: A | Discharge status: Home / Self Care | Payer: BC Managed Care – PPO | Source: Ambulatory Visit | Attending: Internal Medicine | Admitting: Internal Medicine

## 2020-10-01 ENCOUNTER — Other Ambulatory Visit: Payer: BC Managed Care – PPO

## 2020-10-01 DIAGNOSIS — Z113 Encounter for screening for infections with a predominantly sexual mode of transmission: Secondary | ICD-10-CM

## 2020-10-01 DIAGNOSIS — Z21 Asymptomatic human immunodeficiency virus [HIV] infection status: Secondary | ICD-10-CM

## 2020-10-02 LAB — URINE CYTOLOGY ANCILLARY ONLY
Chlamydia: NEGATIVE
Comment: NEGATIVE
Comment: NORMAL
Neisseria Gonorrhea: NEGATIVE

## 2020-10-02 LAB — T-HELPER CELL (CD4) - (RCID CLINIC ONLY)
CD4 % Helper T Cell: 23 % — ABNORMAL LOW (ref 33–65)
CD4 T Cell Abs: 229 /uL — ABNORMAL LOW (ref 400–1790)

## 2020-10-10 LAB — RPR: RPR Ser Ql: REACTIVE — AB

## 2020-10-10 LAB — COMPLETE METABOLIC PANEL WITH GFR
AG Ratio: 1.5 (calc) (ref 1.0–2.5)
ALT: 42 U/L (ref 9–46)
AST: 25 U/L (ref 10–35)
Albumin: 4.6 g/dL (ref 3.6–5.1)
Alkaline phosphatase (APISO): 144 U/L (ref 35–144)
BUN: 9 mg/dL (ref 7–25)
CO2: 31 mmol/L (ref 20–32)
Calcium: 9.8 mg/dL (ref 8.6–10.3)
Chloride: 104 mmol/L (ref 98–110)
Creat: 0.97 mg/dL (ref 0.70–1.33)
GFR, Est African American: 102 mL/min/{1.73_m2} (ref >=60)
GFR, Est Non African American: 88 mL/min/{1.73_m2} (ref >=60)
Globulin: 3 g/dL (calc) (ref 1.9–3.7)
Glucose, Bld: 100 mg/dL — ABNORMAL HIGH (ref 65–99)
Potassium: 4.1 mmol/L (ref 3.5–5.3)
Sodium: 141 mmol/L (ref 135–146)
Total Bilirubin: 0.4 mg/dL (ref 0.2–1.2)
Total Protein: 7.6 g/dL (ref 6.1–8.1)

## 2020-10-10 LAB — HIV-1 RNA QUANT-NO REFLEX-BLD
HIV 1 RNA Quant: <20 Copies/mL
HIV-1 RNA Quant, Log: <1.3 Log cps/mL

## 2020-10-10 LAB — CBC WITH DIFFERENTIAL/PLATELET
Absolute Monocytes: 266 cells/uL (ref 200–950)
Basophils Absolute: 29 cells/uL (ref 0–200)
Basophils Relative: 0.9 %
Eosinophils Absolute: 48 cells/uL (ref 15–500)
Eosinophils Relative: 1.5 %
HCT: 38.3 % — ABNORMAL LOW (ref 38.5–50.0)
Hemoglobin: 13.1 g/dL — ABNORMAL LOW (ref 13.2–17.1)
Lymphs Abs: 979 cells/uL (ref 850–3900)
MCH: 31.8 pg (ref 27.0–33.0)
MCHC: 34.2 g/dL (ref 32.0–36.0)
MCV: 93 fL (ref 80.0–100.0)
MPV: 10.9 fL (ref 7.5–12.5)
Monocytes Relative: 8.3 %
Neutro Abs: 1878 cells/uL (ref 1500–7800)
Neutrophils Relative %: 58.7 %
Platelets: 267 10*3/uL (ref 140–400)
RBC: 4.12 10*6/uL — ABNORMAL LOW (ref 4.20–5.80)
RDW: 12.6 % (ref 11.0–15.0)
Total Lymphocyte: 30.6 %
WBC: 3.2 10*3/uL — ABNORMAL LOW (ref 3.8–10.8)

## 2020-10-10 LAB — RPR TITER: RPR Titer: 1:2 {titer} — ABNORMAL HIGH

## 2020-10-10 LAB — FLUORESCENT TREPONEMAL AB(FTA)-IGG-BLD: Fluorescent Treponemal ABS: REACTIVE — AB

## 2020-10-17 ENCOUNTER — Ambulatory Visit (INDEPENDENT_AMBULATORY_CARE_PROVIDER_SITE_OTHER): Payer: BC Managed Care – PPO | Admitting: Internal Medicine

## 2020-10-17 ENCOUNTER — Other Ambulatory Visit: Payer: Self-pay

## 2020-10-17 ENCOUNTER — Encounter: Payer: Self-pay | Admitting: Internal Medicine

## 2020-10-17 VITALS — BP 135/78 | HR 75 | Resp 16 | Ht 71.0 in | Wt 190.2 lb

## 2020-10-17 DIAGNOSIS — Z5181 Encounter for therapeutic drug level monitoring: Secondary | ICD-10-CM

## 2020-10-17 DIAGNOSIS — Z113 Encounter for screening for infections with a predominantly sexual mode of transmission: Secondary | ICD-10-CM

## 2020-10-17 DIAGNOSIS — Z21 Asymptomatic human immunodeficiency virus [HIV] infection status: Secondary | ICD-10-CM | POA: Diagnosis not present

## 2020-10-17 NOTE — Assessment & Plan Note (Signed)
Creat and LFTs wnl.  No issues.

## 2020-10-17 NOTE — Assessment & Plan Note (Signed)
He is doing well despite his low end CD4 count despite excellent compliance.  It has just remained low.  I discussed this with him and there is no known issues with this and as long as his viral load remains suppressed, I do not anticipate any significant issues.  He will continue with Biktarvy and he can return in 1 year.  Lipid panel monitored by his PCP

## 2020-10-17 NOTE — Progress Notes (Signed)
   Subjective:    Patient ID: Travis Wheeler, male    DOB: 1966-04-12, 55 y.o.   MRN: [id number]  HPI Here for follow up of HIV He continues on Biktarvy and no missed doses.  His CD4 count is 229 and viral load < 20.  He has had no issues getting his medication covered.  He has had his COVID-19 vaccine and booster.  He got his flu shot at work.  He has experienced more depression recently and started on lexapro by Dr. Delfina Redwood.     Review of Systems  Constitutional: Negative for fatigue.  Gastrointestinal: Negative for diarrhea.  Skin: Negative for rash.       Objective:   Physical Exam Constitutional:      Appearance: Normal appearance.  Cardiovascular:     Rate and Rhythm: Normal rate and regular rhythm.  Pulmonary:     Effort: Pulmonary effort is normal.  Neurological:     General: No focal deficit present.     Mental Status: He is alert.  Psychiatric:        Mood and Affect: Mood normal.   SH: no tobacco        Assessment & Plan:

## 2020-10-17 NOTE — Assessment & Plan Note (Signed)
Screened negative.  No current sexual activity.

## 2020-12-14 DIAGNOSIS — Z0000 Encounter for general adult medical examination without abnormal findings: Secondary | ICD-10-CM | POA: Diagnosis not present

## 2020-12-14 DIAGNOSIS — Z13220 Encounter for screening for lipoid disorders: Secondary | ICD-10-CM | POA: Diagnosis not present

## 2021-02-28 ENCOUNTER — Other Ambulatory Visit: Payer: Self-pay | Admitting: Internal Medicine

## 2021-02-28 DIAGNOSIS — B20 Human immunodeficiency virus [HIV] disease: Secondary | ICD-10-CM

## 2021-03-21 DIAGNOSIS — C61 Malignant neoplasm of prostate: Secondary | ICD-10-CM | POA: Diagnosis not present

## 2021-09-25 ENCOUNTER — Other Ambulatory Visit: Payer: Self-pay

## 2021-09-25 ENCOUNTER — Other Ambulatory Visit: Payer: PRIVATE HEALTH INSURANCE

## 2021-09-25 ENCOUNTER — Other Ambulatory Visit (HOSPITAL_COMMUNITY)
Admission: RE | Admit: 2021-09-25 | Discharge: 2021-09-25 | Disposition: A | Discharge status: Home / Self Care | Payer: PRIVATE HEALTH INSURANCE | Source: Ambulatory Visit | Attending: Internal Medicine | Admitting: Internal Medicine

## 2021-09-25 DIAGNOSIS — Z113 Encounter for screening for infections with a predominantly sexual mode of transmission: Secondary | ICD-10-CM | POA: Insufficient documentation

## 2021-09-25 DIAGNOSIS — Z21 Asymptomatic human immunodeficiency virus [HIV] infection status: Secondary | ICD-10-CM

## 2021-09-26 LAB — URINE CYTOLOGY ANCILLARY ONLY
Chlamydia: NEGATIVE
Comment: NEGATIVE
Comment: NORMAL
Neisseria Gonorrhea: NEGATIVE

## 2021-09-26 LAB — T-HELPER CELL (CD4) - (RCID CLINIC ONLY)
CD4 % Helper T Cell: 25 % — ABNORMAL LOW (ref 33–65)
CD4 T Cell Abs: 408 /uL (ref 400–1790)

## 2021-09-27 LAB — HIV-1 RNA QUANT-NO REFLEX-BLD
HIV 1 RNA Quant: NOT DETECTED Copies/mL
HIV-1 RNA Quant, Log: NOT DETECTED Log cps/mL

## 2021-09-27 LAB — COMPLETE METABOLIC PANEL WITH GFR
AG Ratio: 1.7 (calc) (ref 1.0–2.5)
ALT: 32 U/L (ref 9–46)
AST: 25 U/L (ref 10–35)
Albumin: 4.6 g/dL (ref 3.6–5.1)
Alkaline phosphatase (APISO): 119 U/L (ref 35–144)
BUN: 9 mg/dL (ref 7–25)
CO2: 28 mmol/L (ref 20–32)
Calcium: 9.7 mg/dL (ref 8.6–10.3)
Chloride: 103 mmol/L (ref 98–110)
Creat: 1.03 mg/dL (ref 0.70–1.30)
Globulin: 2.7 g/dL (calc) (ref 1.9–3.7)
Glucose, Bld: 82 mg/dL (ref 65–99)
Potassium: 4 mmol/L (ref 3.5–5.3)
Sodium: 140 mmol/L (ref 135–146)
Total Bilirubin: 0.4 mg/dL (ref 0.2–1.2)
Total Protein: 7.3 g/dL (ref 6.1–8.1)
eGFR: 86 mL/min/{1.73_m2} (ref >=60)

## 2021-09-27 LAB — CBC
HCT: 38.2 % — ABNORMAL LOW (ref 38.5–50.0)
Hemoglobin: 12.9 g/dL — ABNORMAL LOW (ref 13.2–17.1)
MCH: 31 pg (ref 27.0–33.0)
MCHC: 33.8 g/dL (ref 32.0–36.0)
MCV: 91.8 fL (ref 80.0–100.0)
MPV: 10.8 fL (ref 7.5–12.5)
Platelets: 268 10*3/uL (ref 140–400)
RBC: 4.16 10*6/uL — ABNORMAL LOW (ref 4.20–5.80)
RDW: 13.1 % (ref 11.0–15.0)
WBC: 4.8 10*3/uL (ref 3.8–10.8)

## 2021-09-27 LAB — RPR TITER: RPR Titer: 1:2 {titer} — ABNORMAL HIGH

## 2021-09-27 LAB — RPR: RPR Ser Ql: REACTIVE — AB

## 2021-09-27 LAB — FLUORESCENT TREPONEMAL AB(FTA)-IGG-BLD: Fluorescent Treponemal ABS: REACTIVE — AB

## 2021-10-09 ENCOUNTER — Other Ambulatory Visit: Payer: Self-pay

## 2021-10-09 ENCOUNTER — Encounter: Payer: Self-pay | Admitting: Internal Medicine

## 2021-10-09 ENCOUNTER — Ambulatory Visit: Payer: PRIVATE HEALTH INSURANCE | Admitting: Internal Medicine

## 2021-10-09 VITALS — BP 144/82 | HR 87 | Resp 16 | Ht 71.0 in | Wt 189.8 lb

## 2021-10-09 DIAGNOSIS — Z5181 Encounter for therapeutic drug level monitoring: Secondary | ICD-10-CM | POA: Diagnosis not present

## 2021-10-09 DIAGNOSIS — B20 Human immunodeficiency virus [HIV] disease: Secondary | ICD-10-CM

## 2021-10-09 DIAGNOSIS — Z113 Encounter for screening for infections with a predominantly sexual mode of transmission: Secondary | ICD-10-CM

## 2021-10-09 DIAGNOSIS — Z21 Asymptomatic human immunodeficiency virus [HIV] infection status: Secondary | ICD-10-CM | POA: Diagnosis not present

## 2021-10-09 MED ORDER — BIKTARVY 50-200-25 MG PO TABS: ORAL_TABLET | ORAL | 3 refills | Status: DC

## 2021-10-09 NOTE — Assessment & Plan Note (Signed)
He continues to do well on Biktarvy and no issues. Can remain on this and rtc yearly. Refills updated.

## 2021-10-09 NOTE — Progress Notes (Signed)
° °  Subjective:    Patient ID: Travis Wheeler, male    DOB: March 08, 1966, 56 y.o.   MRN: [id number]  HPI Here for follow up of HIV He continues on Biktarvy and denies any missed doses. CD4 408 and viral load not detected.  Creat, LFTs wnl.  No significant depression.  Stopped lexapro due to drowsiness.  Asking about Monkeypox vaccine.    Review of Systems  Constitutional:  Negative for fatigue.  Gastrointestinal:  Negative for diarrhea and nausea.  Skin:  Negative for rash.      Objective:   Physical Exam Eyes:     General: No scleral icterus. Pulmonary:     Effort: Pulmonary effort is normal.  Skin:    Findings: No rash.  Neurological:     General: No focal deficit present.     Mental Status: He is alert.  Psychiatric:        Mood and Affect: Mood normal.   SH: no tobacco       Assessment & Plan:

## 2021-10-09 NOTE — Assessment & Plan Note (Signed)
Screened negative.   Discussed monkeypox vaccine and none available at this time.  He will be put on our list.

## 2021-10-09 NOTE — Assessment & Plan Note (Signed)
Creat, LFTs wnl.  

## 2021-11-13 ENCOUNTER — Encounter (INDEPENDENT_AMBULATORY_CARE_PROVIDER_SITE_OTHER): Payer: Self-pay

## 2021-11-13 ENCOUNTER — Ambulatory Visit (INDEPENDENT_AMBULATORY_CARE_PROVIDER_SITE_OTHER): Payer: No Typology Code available for payment source | Admitting: Orthopaedic Surgery

## 2021-12-11 ENCOUNTER — Telehealth: Payer: Self-pay

## 2021-12-11 DIAGNOSIS — B20 Human immunodeficiency virus [HIV] disease: Secondary | ICD-10-CM

## 2021-12-11 MED ORDER — BIKTARVY 50-200-25 MG PO TABS: ORAL_TABLET | ORAL | 3 refills | Status: DC

## 2021-12-11 NOTE — Telephone Encounter (Signed)
-----   Message from Roney Jaffe, CPhT sent at 12/11/2021  2:09 PM EDT ----- ?Regarding: Biktarvy ?Frazier Butt ,  ?This patient called wanted his Biktarvy script go to [facility] so he can get a 90 day supply. He only gave me there # [phone].  ? ?Thank You,  ?Ileene Patrick, CPhT ?Specialty Pharmacy Patient Advocate ?[facility] ?Phone: [phone] ?Fax:? [phone] ?  ? ?

## 2021-12-11 NOTE — Telephone Encounter (Signed)
Spoke with Leata Mouse, CPT at [facility] to cancel [facility]. Resent to [facility] per patient request.  ? ?Beryle Flock, RN ? ?

## 2022-03-02 ENCOUNTER — Encounter: Payer: Self-pay | Admitting: Infectious Diseases

## 2022-09-07 NOTE — Progress Notes (Signed)
 Subjective:    Patient ID: Sean Fernandez is a 56 y.o. male.    PPE: The patient wears a surgical mask. The clinical staff and I wear surgical mask and gloves when appropriate.    Chief Complaint   Patient presents with   . Generalized Body Aches     Symptoms started this morning   . Headache          . Fever       [name] is a 56 y.o. M here today with c/o congestion, sore throat, myalgias, fever, and HA that started this morning.   Patient reports that HA started 3 days ago.   Decreased appetite. Hydrating well.   He has been taking an OTC sinus medication for sx management but has not had any improvement.   Has a history of elevated blood pressure but forgot to take his medication this morning.   Had a negative home COVID test this morning.           The following portions of the patient's history were reviewed and updated as appropriate: allergies, current medications, past family history, past medical history, past social history, past surgical history, and problem list.    Past Medical History:   Diagnosis Date   . Gastroesophageal reflux disease    . Hyperlipidemia    . Hypertension      Current Outpatient Medications on File Prior to Visit   Medication Sig Dispense Refill   . aspirin 325 MG tablet Take 1 tablet (325 mg) by mouth daily     . atorvastatin (LIPITOR) 20 MG tablet Take 1 tablet (20 mg) by mouth daily     . Cholecalciferol 50 MCG (2000 UT) Tab Take by mouth     . flurbiprofen (ANSAID) 100 MG tablet Take 1 tablet (100 mg) by mouth 2 (two) times daily     . Krill Oil (MAXIMUM RED KRILL PO) Take by mouth     . meclizine (ANTIVERT) 25 MG tablet Take 1 tablet (25 mg) by mouth every 6 (six) hours as needed for Dizziness     . niacin (NIASPAN) 500 MG CR tablet Take 1 tablet (500 mg) by mouth 2 (two) times daily     . pentoxifylline (TRENtal) 400 MG CR tablet Take 1 tablet (400 mg) by mouth 3 (three) times daily     . pregabalin (Lyrica) 75 MG capsule TK ONE C PO TID     . tolterodine (DETROL LA) 4  MG 24 hr capsule Take 1 capsule (4 mg) by mouth daily     . [DISCONTINUED] ALPRAZolam (XANAX XR) 1 MG 24 hr tablet Take 1 tablet (1 mg) by mouth 2 (two) times daily       No current facility-administered medications on file prior to visit.     Allergies   Allergen Reactions   . Lisinopril Cough         Review of Systems   Constitutional:  Positive for appetite change (decreased) and fatigue. Negative for fever.   HENT:  Positive for congestion, postnasal drip, rhinorrhea, sinus pressure and sore throat. Negative for ear pain and trouble swallowing.    Respiratory:  Negative for cough, chest tightness, shortness of breath and wheezing.    Cardiovascular:  Negative for chest pain.   Gastrointestinal:  Negative for abdominal pain, constipation, diarrhea, nausea and vomiting.   Genitourinary:  Negative for decreased urine volume.   Musculoskeletal:  Positive for myalgias.   Skin:  Negative for color change  and rash.   Neurological:  Positive for headaches. Negative for dizziness and weakness.       Objective:    BP (!) 167/101   Pulse 77   Temp 99.1 F (37.3 C) (Tympanic)   Resp 16   Ht 1.727 m (5\' 8" )   Wt 133 kg (293 lb 3.2 oz)   SpO2 97%   BMI 44.58 kg/m     Physical Exam  Vitals and nursing note reviewed.   Constitutional:       General: He is not in acute distress.     Appearance: Normal appearance. He is obese. He is ill-appearing. He is not toxic-appearing or diaphoretic.   HENT:      Head: Normocephalic and atraumatic.      Right Ear: Tympanic membrane, ear canal and external ear normal. There is no impacted cerumen.      Left Ear: Tympanic membrane, ear canal and external ear normal. There is no impacted cerumen.      Nose: Congestion and rhinorrhea present.      Mouth/Throat:      Mouth: Mucous membranes are moist.      Pharynx: Oropharynx is clear. Posterior oropharyngeal erythema present. No oropharyngeal exudate.   Eyes:      General:         Right eye: No discharge.         Left eye: No discharge.       Extraocular Movements: Extraocular movements intact.      Conjunctiva/sclera: Conjunctivae normal.   Cardiovascular:      Rate and Rhythm: Normal rate and regular rhythm.      Pulses: Normal pulses.      Heart sounds: Normal heart sounds. No murmur heard.     No friction rub. No gallop.   Pulmonary:      Effort: Pulmonary effort is normal. No respiratory distress.      Breath sounds: Normal breath sounds. No stridor. No wheezing, rhonchi or rales.   Chest:      Chest wall: No tenderness.   Musculoskeletal:      Cervical back: Normal range of motion and neck supple. No rigidity or tenderness.   Lymphadenopathy:      Cervical: No cervical adenopathy.   Skin:     General: Skin is warm and dry.      Capillary Refill: Capillary refill takes less than 2 seconds.   Neurological:      Mental Status: He is alert and oriented to person, place, and time.   Psychiatric:         Mood and Affect: Mood normal.         Behavior: Behavior normal.         Thought Content: Thought content normal.         Assessment and Plan:       Guerdon was seen today for generalized body aches, headache and fever.    Diagnoses and all orders for this visit:    Viral URI with cough  -     Respiratory Specimen Xpert Xpress  CoV-2 / Flu / RSV Plus      -Xpert Xpress testing sent to the lab.   -Symptoms are consistent with viral URI.   -Encouraged patient to take his HTN medication when he gets home and stick with OTC medications that are appropriate for patients with HTN as they do not contain ingredients that elevate BP.   -Hydrate well and rest.   -Remain home until test  results have been received.   -Patient verbalized understanding and agrees with POC.     Raye Cai, NP  [facility]  09/07/2022  2:31 PM

## 2022-11-11 ENCOUNTER — Other Ambulatory Visit: Payer: Self-pay | Admitting: Internal Medicine

## 2022-11-11 DIAGNOSIS — B20 Human immunodeficiency virus [HIV] disease: Secondary | ICD-10-CM

## 2022-11-20 ENCOUNTER — Other Ambulatory Visit (HOSPITAL_COMMUNITY)
Admission: RE | Admit: 2022-11-20 | Discharge: 2022-11-20 | Disposition: A | Discharge status: Home / Self Care | Payer: PRIVATE HEALTH INSURANCE | Source: Ambulatory Visit | Attending: Internal Medicine | Admitting: Internal Medicine

## 2022-11-20 ENCOUNTER — Other Ambulatory Visit: Payer: PRIVATE HEALTH INSURANCE

## 2022-11-20 ENCOUNTER — Other Ambulatory Visit: Payer: Self-pay

## 2022-11-20 DIAGNOSIS — Z79899 Other long term (current) drug therapy: Secondary | ICD-10-CM

## 2022-11-20 DIAGNOSIS — Z113 Encounter for screening for infections with a predominantly sexual mode of transmission: Secondary | ICD-10-CM

## 2022-11-20 DIAGNOSIS — B20 Human immunodeficiency virus [HIV] disease: Secondary | ICD-10-CM

## 2022-11-21 LAB — T-HELPER CELL (CD4) - (RCID CLINIC ONLY)
CD4 % Helper T Cell: 26 % — ABNORMAL LOW (ref 33–65)
CD4 T Cell Abs: 374 /uL — ABNORMAL LOW (ref 400–1790)

## 2022-11-21 LAB — URINE CYTOLOGY ANCILLARY ONLY
Chlamydia: NEGATIVE
Comment: NEGATIVE
Comment: NORMAL
Neisseria Gonorrhea: NEGATIVE

## 2022-11-23 LAB — RPR TITER: RPR Titer: 1:1 {titer} — ABNORMAL HIGH

## 2022-11-23 LAB — LIPID PANEL
Cholesterol: 192 mg/dL (ref <200)
HDL: 53 mg/dL (ref >=40)
LDL Cholesterol (Calc): 107 mg/dL (calc) — ABNORMAL HIGH
Non-HDL Cholesterol (Calc): 139 mg/dL (calc) — ABNORMAL HIGH (ref <130)
Total CHOL/HDL Ratio: 3.6 (calc) (ref <5.0)
Triglycerides: 208 mg/dL — ABNORMAL HIGH (ref <150)

## 2022-11-23 LAB — CBC WITH DIFFERENTIAL/PLATELET
Absolute Monocytes: 328 cells/uL (ref 200–950)
Basophils Absolute: 28 cells/uL (ref 0–200)
Basophils Relative: 0.7 %
Eosinophils Absolute: 68 cells/uL (ref 15–500)
Eosinophils Relative: 1.7 %
HCT: 40.2 % (ref 38.5–50.0)
Hemoglobin: 13.9 g/dL (ref 13.2–17.1)
Lymphs Abs: 1456 cells/uL (ref 850–3900)
MCH: 31.2 pg (ref 27.0–33.0)
MCHC: 34.6 g/dL (ref 32.0–36.0)
MCV: 90.3 fL (ref 80.0–100.0)
MPV: 10.6 fL (ref 7.5–12.5)
Monocytes Relative: 8.2 %
Neutro Abs: 2120 cells/uL (ref 1500–7800)
Neutrophils Relative %: 53 %
Platelets: 286 10*3/uL (ref 140–400)
RBC: 4.45 10*6/uL (ref 4.20–5.80)
RDW: 13.2 % (ref 11.0–15.0)
Total Lymphocyte: 36.4 %
WBC: 4 10*3/uL (ref 3.8–10.8)

## 2022-11-23 LAB — COMPLETE METABOLIC PANEL WITH GFR
AG Ratio: 1.7 (calc) (ref 1.0–2.5)
ALT: 31 U/L (ref 9–46)
AST: 22 U/L (ref 10–35)
Albumin: 4.7 g/dL (ref 3.6–5.1)
Alkaline phosphatase (APISO): 107 U/L (ref 35–144)
BUN: 9 mg/dL (ref 7–25)
CO2: 29 mmol/L (ref 20–32)
Calcium: 9.8 mg/dL (ref 8.6–10.3)
Chloride: 103 mmol/L (ref 98–110)
Creat: 1.08 mg/dL (ref 0.70–1.30)
Globulin: 2.8 g/dL (calc) (ref 1.9–3.7)
Glucose, Bld: 94 mg/dL (ref 65–99)
Potassium: 4.2 mmol/L (ref 3.5–5.3)
Sodium: 140 mmol/L (ref 135–146)
Total Bilirubin: 0.4 mg/dL (ref 0.2–1.2)
Total Protein: 7.5 g/dL (ref 6.1–8.1)
eGFR: 81 mL/min/{1.73_m2} (ref >=60)

## 2022-11-23 LAB — HIV-1 RNA QUANT-NO REFLEX-BLD
HIV 1 RNA Quant: NOT DETECTED Copies/mL
HIV-1 RNA Quant, Log: NOT DETECTED Log cps/mL

## 2022-11-23 LAB — RPR: RPR Ser Ql: REACTIVE — AB

## 2022-11-23 LAB — T PALLIDUM AB: T Pallidum Abs: POSITIVE — AB

## 2022-11-28 ENCOUNTER — Other Ambulatory Visit: Payer: Self-pay

## 2022-11-28 ENCOUNTER — Other Ambulatory Visit (HOSPITAL_COMMUNITY): Payer: Self-pay | Admitting: General Practice

## 2022-11-28 ENCOUNTER — Inpatient Hospital Stay
Admission: RE | Admit: 2022-11-28 | Discharge: 2022-11-28 | Disposition: A | Discharge status: Home / Self Care | Payer: Medicare (Managed Care) | Source: Ambulatory Visit | Attending: General Practice | Admitting: General Practice

## 2022-11-28 DIAGNOSIS — Z139 Encounter for screening, unspecified: Secondary | ICD-10-CM | POA: Insufficient documentation

## 2022-11-28 DIAGNOSIS — Z136 Encounter for screening for cardiovascular disorders: Secondary | ICD-10-CM | POA: Insufficient documentation

## 2022-11-28 DIAGNOSIS — E785 Hyperlipidemia, unspecified: Secondary | ICD-10-CM

## 2022-11-28 DIAGNOSIS — Z5181 Encounter for therapeutic drug level monitoring: Secondary | ICD-10-CM

## 2022-11-28 DIAGNOSIS — Z0000 Encounter for general adult medical examination without abnormal findings: Secondary | ICD-10-CM

## 2022-11-28 DIAGNOSIS — I10 Essential (primary) hypertension: Secondary | ICD-10-CM

## 2022-11-28 DIAGNOSIS — Z8616 Personal history of COVID-19: Secondary | ICD-10-CM | POA: Insufficient documentation

## 2022-11-28 LAB — ECG 12-LEAD
Atrial Rate: 75 {beats}/min
Calculated P Axis: 62 degrees
Calculated R Axis: -46 degrees
Calculated T Axis: 43 degrees
PR Interval: 156 ms
QRS Duration: 96 ms
QT Interval: 386 ms
QTC Calculation: 431 ms
Ventricular rate: 75 {beats}/min

## 2022-12-02 ENCOUNTER — Encounter: Payer: Self-pay | Admitting: Internal Medicine

## 2022-12-02 ENCOUNTER — Ambulatory Visit (INDEPENDENT_AMBULATORY_CARE_PROVIDER_SITE_OTHER): Payer: PRIVATE HEALTH INSURANCE | Admitting: Internal Medicine

## 2022-12-02 ENCOUNTER — Other Ambulatory Visit: Payer: Self-pay

## 2022-12-02 VITALS — BP 133/75 | HR 76 | Temp 98.9°F | Wt 195.0 lb

## 2022-12-02 DIAGNOSIS — Z21 Asymptomatic human immunodeficiency virus [HIV] infection status: Secondary | ICD-10-CM | POA: Diagnosis not present

## 2022-12-02 DIAGNOSIS — Z79899 Other long term (current) drug therapy: Secondary | ICD-10-CM

## 2022-12-02 DIAGNOSIS — Z23 Encounter for immunization: Secondary | ICD-10-CM | POA: Diagnosis not present

## 2022-12-02 DIAGNOSIS — Z113 Encounter for screening for infections with a predominantly sexual mode of transmission: Secondary | ICD-10-CM

## 2022-12-02 MED ORDER — PRAVASTATIN SODIUM 10 MG PO TABS: 10.0000 mg | ORAL_TABLET | Freq: Every day | ORAL | 11 refills | Status: DC

## 2022-12-02 NOTE — Addendum Note (Signed)
Addended by: Leatrice Jewels on: 12/02/2022 04:32 PM   Modules accepted: Orders

## 2022-12-02 NOTE — Assessment & Plan Note (Signed)
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   The 10-year ASCVD risk score (Arnett DK, et al., 2019) is: 7.2%   Values used to calculate the score:     Age: 57 years     Sex: Male     Is Non-Hispanic African American: Yes     Diabetic: No     Tobacco smoker: No     Systolic Blood Pressure: Q000111Q mmHg     Is BP treated: No     HDL Cholesterol: 53 mg/dL     Total Cholesterol: 192 mg/dL  We discussed the patient's 10-year CVD Risk Score to be at least moderately elevated, and would benefit from intervention given HIV+ and > 56 yo.   Will proceed with lower-intensity statin given non-diabetic.  Discussed side effects.  Has an appointment with his PCP coming up.

## 2022-12-02 NOTE — Assessment & Plan Note (Signed)
Doing well on Biktarvy and labs reviewed with him.  All good and refills provided. Follow up in 1 year

## 2022-12-02 NOTE — Progress Notes (Signed)
   Subjective:    Patient ID: Travis Wheeler, male    DOB: 1965-11-18, 57 y.o.   MRN: [id number]  HPI Here for follow up of HIV He continues on Biktarvy with no missed doses.  No issues with getting or taking his medication.  Feels well.  Has a PCP    Review of Systems  Constitutional:  Negative for fatigue.  Gastrointestinal:  Negative for diarrhea and nausea.  Skin:  Negative for rash.       Objective:   Physical Exam Eyes:     General: No scleral icterus. Pulmonary:     Effort: Pulmonary effort is normal.  Skin:    Findings: No rash.  Neurological:     Mental Status: He is alert.   SH: no tobacco        Assessment & Plan:

## 2022-12-02 NOTE — Assessment & Plan Note (Signed)
Screened negative 

## 2022-12-30 ENCOUNTER — Other Ambulatory Visit: Payer: Self-pay | Admitting: Internal Medicine

## 2022-12-30 DIAGNOSIS — B20 Human immunodeficiency virus [HIV] disease: Secondary | ICD-10-CM

## 2022-12-30 NOTE — Telephone Encounter (Signed)
Appt 4/4

## 2022-12-31 ENCOUNTER — Other Ambulatory Visit (HOSPITAL_COMMUNITY): Payer: Self-pay | Admitting: General Practice

## 2022-12-31 DIAGNOSIS — R0683 Snoring: Secondary | ICD-10-CM

## 2023-01-01 ENCOUNTER — Other Ambulatory Visit (HOSPITAL_COMMUNITY)
Admission: RE | Admit: 2023-01-01 | Discharge: 2023-01-01 | Disposition: A | Discharge status: Home / Self Care | Payer: PRIVATE HEALTH INSURANCE | Source: Ambulatory Visit | Attending: Internal Medicine | Admitting: Internal Medicine

## 2023-01-01 ENCOUNTER — Ambulatory Visit (INDEPENDENT_AMBULATORY_CARE_PROVIDER_SITE_OTHER): Payer: PRIVATE HEALTH INSURANCE

## 2023-01-01 ENCOUNTER — Other Ambulatory Visit: Payer: Self-pay

## 2023-01-01 ENCOUNTER — Ambulatory Visit (INDEPENDENT_AMBULATORY_CARE_PROVIDER_SITE_OTHER): Payer: PRIVATE HEALTH INSURANCE | Admitting: Pharmacist

## 2023-01-01 DIAGNOSIS — Z113 Encounter for screening for infections with a predominantly sexual mode of transmission: Secondary | ICD-10-CM | POA: Diagnosis present

## 2023-01-01 DIAGNOSIS — Z23 Encounter for immunization: Secondary | ICD-10-CM | POA: Diagnosis not present

## 2023-01-02 NOTE — Progress Notes (Signed)
NEW REFERRAL TO CPP CLINIC    01/02/2023  HPI: Wadsworth Urbanek is a 57 y.o. male who presents to the [facility] clinic today for STI testing.  Patient Active Problem List   Diagnosis Date Noted   Encounter for long-term (current) use of high-risk medication 12/02/2022   Routine screening for STI (sexually transmitted infection) 01/04/2020   Medication monitoring encounter 05/12/2019   Colon cancer screening 08/18/2018   Family history of malignant neoplasm of gastrointestinal tract 08/18/2018   Constipation 08/18/2018   Prostate cancer 10/02/2017   Malignant neoplasm of prostate 08/12/2017   Pulmonary embolus 10/01/2016   AIN grade I 07/18/2014   HIV (human immunodeficiency virus infection) 03/23/2013    Patient's Medications  New Prescriptions   No medications on file  Previous Medications   ACETAMINOPHEN (TYLENOL) 500 MG TABLET    Take 1,000 mg by mouth every 6 (six) hours as needed for moderate pain.   BIKTARVY 50-200-25 MG TABS TABLET    Take 1 tablet by mouth once per day   CETIRIZINE (ZYRTEC) 10 MG TABLET    Take 10 mg by mouth daily.   DIPHENHYDRAMINE (BENADRYL ALLERGY) 25 MG CAPSULE    1 capsule Orally bedtime prn   ESCITALOPRAM (LEXAPRO) 20 MG TABLET    Take 20 mg by mouth daily.   MENTHOL, TOPICAL ANALGESIC, (ICY HOT EX)    Apply 1 application topically daily as needed (for muscle pain).   MENTHOL-METHYL SALICYLATE (SALONPAS JET SPRAY EX)    Apply 1 spray topically daily as needed (for muscle pain).   MULTIPLE VITAMINS-MINERALS (MULTI ADULT GUMMIES) CHEW    Chew by mouth.   PATADAY 0.2 % SOLN    Instill 1 drop into both eyes twice daily as needed for allergies   PRAVASTATIN (PRAVACHOL) 10 MG TABLET    Take 1 tablet (10 mg total) by mouth daily.   PSEUDOEPH-DOXYLAMINE-DM-APAP (NYQUIL PO)    Take 30 mLs by mouth 4 (four) times daily as needed (for congestion).  Modified Medications   No medications on file  Discontinued Medications   No medications on file    Allergies: No  Known Allergies  Past Medical History: Past Medical History:  Diagnosis Date   History of kidney stones    HIV infection (HCC)    Pneumonia    Prostate cancer (HCC)    Seasonal allergies     Social History: Social History   Socioeconomic History   Marital status: Single    Spouse name: Not on file   Number of children: Not on file   Years of education: Not on file   Highest education level: Not on file  Occupational History   Not on file  Tobacco Use   Smoking status: Never   Smokeless tobacco: Never  Vaping Use   Vaping Use: Never used  Substance and Sexual Activity   Alcohol use: Yes    Alcohol/week: 0.0 standard drinks of alcohol    Comment: Socially   Drug use: No   Sexual activity: Not Currently    Partners: Male  Other Topics Concern   Not on file  Social History Narrative   Resides in [place].   Social Determinants of Health   Financial Resource Strain: Not on file  Food Insecurity: Not on file  Transportation Needs: Not on file  Physical Activity: Not on file  Stress: Not on file  Social Connections: Not on file     Assessment: Sidh is here today for a nurse visit to receive his  2nd MPOX vaccine. He mentioned to the staff that he needs STI testing as he recently had a sexual encounter where the condom broke.  He states that he was the receptive partner during the encounter. He states that he is usually the receptive partner during sex, but he has never had a rectal screen for STIs. Explained that since he is at risk for rectal STIs, we should screen there. He agrees to full STI testing today with RPR and oral/urine/rectal cytologies. He has no symptoms.   Plan: - STI screening: RPR, urine/rectal/pharyngeal GC/CT swabs for cytology today - F/u results to see if treatment is needed  Fady Stamps L. Jhonatan Lomeli, PharmD, BCIDP, AAHIVP, CPP Clinical Pharmacist Practitioner Infectious Diseases Clinical Pharmacist [facility] 01/02/2023, 4:39 PM

## 2023-01-03 LAB — RPR TITER: RPR Titer: 1:1 {titer} — ABNORMAL HIGH

## 2023-01-03 LAB — T PALLIDUM AB: T Pallidum Abs: POSITIVE — AB

## 2023-01-03 LAB — RPR: RPR Ser Ql: REACTIVE — AB

## 2023-01-05 LAB — URINE CYTOLOGY ANCILLARY ONLY
Chlamydia: NEGATIVE
Comment: NEGATIVE
Comment: NORMAL
Neisseria Gonorrhea: NEGATIVE

## 2023-01-05 LAB — CYTOLOGY, (ORAL, ANAL, URETHRAL) ANCILLARY ONLY
Chlamydia: NEGATIVE
Chlamydia: NEGATIVE
Comment: NEGATIVE
Comment: NEGATIVE
Comment: NORMAL
Comment: NORMAL
Neisseria Gonorrhea: NEGATIVE
Neisseria Gonorrhea: NEGATIVE

## 2023-01-13 ENCOUNTER — Ambulatory Visit (HOSPITAL_BASED_OUTPATIENT_CLINIC_OR_DEPARTMENT_OTHER): Payer: Self-pay

## 2023-01-14 ENCOUNTER — Inpatient Hospital Stay
Admission: RE | Admit: 2023-01-14 | Discharge: 2023-01-14 | Disposition: A | Discharge status: Home / Self Care | Payer: Medicare (Managed Care) | Source: Ambulatory Visit | Attending: General Practice | Admitting: General Practice

## 2023-01-14 ENCOUNTER — Other Ambulatory Visit: Payer: Self-pay

## 2023-01-14 DIAGNOSIS — R0683 Snoring: Secondary | ICD-10-CM

## 2023-01-14 NOTE — Ancillary Notes (Signed)
Neurodiagnostic Lab Note      Sean Fernandez was educated on how to use the unattended sleep study device. Device was sent home with patient to complete the study tonight 01/14/2023 and told to return the device tomorrow morning.      Terie Purser, [facility]  01/14/2023, 11:30

## 2023-01-19 NOTE — Procedures (Signed)
[name], Sean Fernandez   HOSPITAL NUMBER:  [id number]  DATE OF SERVICE:  01/14/2023  DOB:  1966/04/18  SEX:  M      REFERRING PHYSICIAN:  Deboraha Sprang, MD.    INDICATIONS FOR TESTING:  Witnessed snoring.    DESCRIPTION OF STUDY:  The patient was studied using a WatchPAT system on January 14, 2023, for 8 hours 58 minutes, 8 hours 28 minutes of which were asleep.  The patient had a sleep onset of 19 minutes and a sleep efficiency of 94%.  Analysis of sleep architecture demonstrated 23% REM, 66% light, and 10% deep sleep.  The patient had frequent episodes of sleep-disordered breathing.  The apnea-hypopnea index average for the night was calculated at 20.8, index in REM was 52.9.  The mean oxygen saturation was 92%, the minimum 72%.  The patient had 30.7 minutes with oxygen saturation less than 88% and 1.8 minutes with oxygen saturation less than 80%.    IMPRESSION:  1. Sleep-disordered breathing.  2. Obstructive sleep apnea, moderate to severe, with profound and sustained oxygen desaturation.    RECOMMENDATION:  CPAP titration study.        Lillia Dallas, MD            DD:  01/18/2023 08:32:04  DT:  01/19/2023 08:33:04 JR  D#:  [id number]

## 2023-02-03 ENCOUNTER — Other Ambulatory Visit (HOSPITAL_COMMUNITY): Payer: Self-pay

## 2023-02-04 ENCOUNTER — Other Ambulatory Visit (HOSPITAL_COMMUNITY): Payer: Self-pay

## 2023-02-09 ENCOUNTER — Other Ambulatory Visit (HOSPITAL_COMMUNITY): Payer: Self-pay

## 2023-02-16 ENCOUNTER — Other Ambulatory Visit (HOSPITAL_COMMUNITY): Payer: Self-pay

## 2023-02-18 ENCOUNTER — Other Ambulatory Visit (HOSPITAL_COMMUNITY): Payer: Self-pay

## 2023-02-19 ENCOUNTER — Other Ambulatory Visit: Payer: Self-pay

## 2023-02-19 ENCOUNTER — Telehealth: Payer: Self-pay

## 2023-02-19 ENCOUNTER — Other Ambulatory Visit (HOSPITAL_COMMUNITY): Payer: Self-pay

## 2023-02-19 ENCOUNTER — Other Ambulatory Visit: Payer: Self-pay | Admitting: Pharmacist

## 2023-02-19 DIAGNOSIS — B20 Human immunodeficiency virus [HIV] disease: Secondary | ICD-10-CM

## 2023-02-19 MED ORDER — BIKTARVY 50-200-25 MG PO TABS
1.0000 | ORAL_TABLET | Freq: Every day | ORAL | 9 refills | Status: DC
Start: 2023-02-19 — End: 2023-10-28
  Filled 2023-02-19 (×2): qty 30, 30d supply, fill #0
  Filled 2023-03-12: qty 30, 30d supply, fill #1
  Filled 2023-04-09: qty 30, 30d supply, fill #2
  Filled 2023-05-08: qty 30, 30d supply, fill #3
  Filled 2023-06-04: qty 30, 30d supply, fill #4
  Filled 2023-07-02: qty 30, 30d supply, fill #5
  Filled 2023-07-30: qty 30, 30d supply, fill #6
  Filled 2023-08-20: qty 30, 30d supply, fill #7
  Filled 2023-09-16: qty 30, 30d supply, fill #8
  Filled 2023-10-08: qty 30, 30d supply, fill #9

## 2023-02-19 NOTE — Telephone Encounter (Signed)
[facility] Patient Advocate Encounter   Was successful in obtaining a [employer] copay card for Biktarvy.  This copay card will make the patients copay $0.00.  I have spoken with the patient.    The billing information is as follows and has been shared with [facility].         Salisha Bardsley, CPhT Specialty Pharmacy Patient Advocate [facility] Phone: [phone] Fax:  [phone]  

## 2023-02-19 NOTE — Progress Notes (Signed)
Per Lupita Leash since patient has been experiencing issues filling through [facility].  Margarite Gouge, PharmD, CPP, BCIDP, AAHIVP Clinical Pharmacist Practitioner Infectious Diseases Clinical Pharmacist [facility]

## 2023-03-12 ENCOUNTER — Other Ambulatory Visit (HOSPITAL_COMMUNITY): Payer: Self-pay

## 2023-03-16 ENCOUNTER — Other Ambulatory Visit (HOSPITAL_COMMUNITY): Payer: Self-pay

## 2023-04-09 ENCOUNTER — Other Ambulatory Visit (HOSPITAL_COMMUNITY): Payer: Self-pay

## 2023-04-14 ENCOUNTER — Other Ambulatory Visit (HOSPITAL_COMMUNITY): Payer: Self-pay

## 2023-05-08 ENCOUNTER — Other Ambulatory Visit (HOSPITAL_COMMUNITY): Payer: Self-pay

## 2023-05-11 ENCOUNTER — Other Ambulatory Visit (HOSPITAL_COMMUNITY): Payer: Self-pay

## 2023-06-04 ENCOUNTER — Other Ambulatory Visit (HOSPITAL_COMMUNITY): Payer: Self-pay

## 2023-06-08 ENCOUNTER — Other Ambulatory Visit (HOSPITAL_COMMUNITY): Payer: Self-pay

## 2023-06-25 ENCOUNTER — Other Ambulatory Visit (HOSPITAL_COMMUNITY): Payer: Self-pay | Admitting: General Practice

## 2023-06-25 DIAGNOSIS — E785 Hyperlipidemia, unspecified: Secondary | ICD-10-CM

## 2023-06-25 DIAGNOSIS — R9431 Abnormal electrocardiogram [ECG] [EKG]: Secondary | ICD-10-CM

## 2023-06-25 DIAGNOSIS — I10 Essential (primary) hypertension: Secondary | ICD-10-CM

## 2023-06-25 DIAGNOSIS — E669 Obesity, unspecified: Secondary | ICD-10-CM

## 2023-07-02 ENCOUNTER — Other Ambulatory Visit: Payer: Self-pay

## 2023-07-02 NOTE — Progress Notes (Signed)
Specialty Pharmacy Refill Coordination Note  Travis Wheeler is a 57 y.o. male contacted today regarding refills of specialty medication(s) Bictegravir-Emtricitab-Tenofov   Patient requested Delivery   Delivery date: 07/08/23   Verified address: [address]   [place] [address]   Medication will be filled on 07/07/23.

## 2023-07-23 ENCOUNTER — Encounter (HOSPITAL_COMMUNITY): Payer: Self-pay

## 2023-07-30 ENCOUNTER — Other Ambulatory Visit: Payer: Self-pay

## 2023-07-30 ENCOUNTER — Other Ambulatory Visit (HOSPITAL_COMMUNITY): Payer: Self-pay

## 2023-07-30 NOTE — Progress Notes (Signed)
Specialty Pharmacy Refill Coordination Note  Travis Wheeler is a 57 y.o. male contacted today regarding refills of specialty medication(s) Bictegravir-Emtricitab-Tenofov   Patient requested Delivery   Delivery date: 08/04/23   Verified address: [address]   [place] [address]   Medication will be filled on 08/03/23.

## 2023-08-03 ENCOUNTER — Other Ambulatory Visit: Payer: Self-pay

## 2023-08-20 ENCOUNTER — Other Ambulatory Visit: Payer: Self-pay

## 2023-08-20 NOTE — Progress Notes (Signed)
Specialty Pharmacy Refill Coordination Note  Travis Wheeler is a 57 y.o. male contacted today regarding refills of specialty medication(s) Bictegravir-Emtricitab-Tenofov   Patient requested Delivery   Delivery date: 08/28/23   Verified address: [address]   [place] [address]   Medication will be filled on 08/26/23.

## 2023-08-21 ENCOUNTER — Other Ambulatory Visit: Payer: Self-pay

## 2023-08-24 ENCOUNTER — Other Ambulatory Visit: Payer: Self-pay

## 2023-08-26 ENCOUNTER — Other Ambulatory Visit: Payer: Self-pay

## 2023-08-27 ENCOUNTER — Emergency Department (HOSPITAL_COMMUNITY): Payer: Medicare (Managed Care)

## 2023-08-27 ENCOUNTER — Emergency Department
Admission: EM | Admit: 2023-08-27 | Discharge: 2023-08-27 | Disposition: A | Discharge status: Home / Self Care | Payer: Medicare (Managed Care) | Attending: Emergency Medicine | Admitting: Emergency Medicine

## 2023-08-27 ENCOUNTER — Other Ambulatory Visit: Payer: Self-pay

## 2023-08-27 ENCOUNTER — Encounter (HOSPITAL_COMMUNITY): Payer: Self-pay

## 2023-08-27 DIAGNOSIS — N400 Enlarged prostate without lower urinary tract symptoms: Secondary | ICD-10-CM | POA: Insufficient documentation

## 2023-08-27 DIAGNOSIS — N2889 Other specified disorders of kidney and ureter: Secondary | ICD-10-CM | POA: Insufficient documentation

## 2023-08-27 DIAGNOSIS — N200 Calculus of kidney: Secondary | ICD-10-CM | POA: Insufficient documentation

## 2023-08-27 DIAGNOSIS — K5730 Diverticulosis of large intestine without perforation or abscess without bleeding: Secondary | ICD-10-CM | POA: Insufficient documentation

## 2023-08-27 DIAGNOSIS — R1010 Upper abdominal pain, unspecified: Secondary | ICD-10-CM | POA: Insufficient documentation

## 2023-08-27 DIAGNOSIS — R100 Acute abdomen: Secondary | ICD-10-CM

## 2023-08-27 DIAGNOSIS — R9431 Abnormal electrocardiogram [ECG] [EKG]: Secondary | ICD-10-CM | POA: Insufficient documentation

## 2023-08-27 DIAGNOSIS — K4090 Unilateral inguinal hernia, without obstruction or gangrene, not specified as recurrent: Secondary | ICD-10-CM | POA: Insufficient documentation

## 2023-08-27 DIAGNOSIS — R109 Unspecified abdominal pain: Secondary | ICD-10-CM

## 2023-08-27 LAB — GOLD TOP TUBE

## 2023-08-27 LAB — URINALYSIS WITH MICROSCOPIC REFLEX IF INDICATED BMC/JMC ONLY
BILIRUBIN: NEGATIVE mg/dL
BLOOD: NEGATIVE mg/dL
GLUCOSE: NEGATIVE mg/dL
KETONES: NEGATIVE mg/dL
LEUKOCYTES: NEGATIVE WBCs/uL
NITRITE: NEGATIVE
PH: 7 (ref <8.0)
PROTEIN: NEGATIVE mg/dL
SPECIFIC GRAVITY: 1.015 (ref <1.022)
UROBILINOGEN: 1 mg/dL (ref <=2.0)

## 2023-08-27 LAB — CBC WITH DIFF
BASOPHIL #: <0.1 10*3/uL (ref <=0.20)
BASOPHIL %: 0.7 %
EOSINOPHIL #: 0.21 10*3/uL (ref <=0.50)
EOSINOPHIL %: 3 %
HCT: 46.9 % (ref 38.9–52.0)
HGB: 16 g/dL (ref 13.4–17.5)
IMMATURE GRANULOCYTE #: <0.1 10*3/uL (ref <0.10)
IMMATURE GRANULOCYTE %: 0.3 % (ref 0.0–1.0)
LYMPHOCYTE #: 2.2 10*3/uL (ref 1.00–4.80)
LYMPHOCYTE %: 31.4 %
MCH: 29.5 pg (ref 26.0–32.0)
MCHC: 34.1 g/dL (ref 31.0–35.5)
MCV: 86.5 fL (ref 78.0–100.0)
MONOCYTE #: 0.67 10*3/uL (ref 0.20–1.10)
MONOCYTE %: 9.6 %
MPV: 9.1 fL (ref 8.7–12.5)
NEUTROPHIL #: 3.85 10*3/uL (ref 1.50–7.70)
NEUTROPHIL %: 55 %
PLATELETS: 190 10*3/uL (ref 150–400)
RBC: 5.42 10*6/uL (ref 4.50–6.10)
RDW-CV: 13.4 % (ref 11.5–15.5)
WBC: 7 10*3/uL (ref 3.7–11.0)

## 2023-08-27 LAB — COMPREHENSIVE METABOLIC PANEL, NON-FASTING
ALBUMIN: 3.9 g/dL (ref 3.5–5.0)
ALKALINE PHOSPHATASE: 56 U/L (ref 45–115)
ALT (SGPT): 21 U/L (ref 10–55)
ANION GAP: 8 mmol/L (ref 4–13)
AST (SGOT): 16 U/L (ref 8–45)
BILIRUBIN TOTAL: 1.4 mg/dL — ABNORMAL HIGH (ref 0.3–1.3)
BUN/CREA RATIO: 21 (ref 6–22)
BUN: 16 mg/dL (ref 8–25)
CALCIUM: 8.7 mg/dL (ref 8.6–10.2)
CHLORIDE: 105 mmol/L (ref 96–111)
CO2 TOTAL: 24 mmol/L (ref 22–30)
CREATININE: 0.78 mg/dL (ref 0.75–1.35)
ESTIMATED GFR - MALE: >90 mL/min/BSA (ref >=60)
GLUCOSE: 94 mg/dL (ref 65–125)
POTASSIUM: 4.3 mmol/L (ref 3.5–5.1)
PROTEIN TOTAL: 7.2 g/dL (ref 6.4–8.3)
SODIUM: 137 mmol/L (ref 136–145)

## 2023-08-27 LAB — LIPASE: LIPASE: 27 U/L (ref 10–60)

## 2023-08-27 LAB — PSA DIAGNOSTIC WITH FREE PSA REFLEX: PSA: 1.65 ng/mL (ref <=4.00)

## 2023-08-27 LAB — BLUE TOP TUBE

## 2023-08-27 LAB — LIGHT GREEN TOP TUBE

## 2023-08-27 LAB — TROPONIN-I: TROPONIN-I HS: <2.7 ng/L (ref <=35.0)

## 2023-08-27 LAB — RED TOP TUBE

## 2023-08-27 MED ORDER — SODIUM CHLORIDE 0.9 % (FLUSH) INJECTION SYRINGE
10.0000 mL | INJECTION | Freq: Three times a day (TID) | INTRAMUSCULAR | Status: DC
Start: 2023-08-27 — End: 2023-08-27

## 2023-08-27 MED ORDER — IOPAMIDOL 370 MG IODINE/ML (76 %) INTRAVENOUS SOLUTION
100.0000 mL | INTRAVENOUS | Status: AC
Start: 2023-08-27 — End: 2023-08-27
  Administered 2023-08-27: 88 mL via INTRAVENOUS

## 2023-08-27 MED ORDER — GI COCKTAIL (ANTACID SUSP, HYOSCYAMINE, LIDOCAINE)
55.0000 mL | ORAL | Status: AC
Start: 2023-08-27 — End: 2023-08-27
  Administered 2023-08-27: 55 mL via ORAL
  Filled 2023-08-27: qty 55

## 2023-08-27 NOTE — ED Provider Notes (Signed)
[address]  [facility] MEDICINE  Emergency Department Visit Note    Date: 08/27/2023  Primary care provider: Deboraha Sprang, MD  Means of arrival: private car  History obtained by: patient  History limited by: none      Chief Complaint: Upper abdominal pain    History of Present Illness     Sean Fernandez, date of birth [birth year], is a 57 y.o. male who presents to the Emergency Department complaining of upper abdominal pain.    Context:  Patient states that he began yesterday around 1500 hours with epigastric pain.  States now also some radiation into the back.  Denies n/v/d.  Denies fever/chills.  Denies back/flank pain.  Denies urinary symptoms.  Denies rashes.  States has had increased burping.  Denies CP or SOB.  States that he still has all of his abdominal organs.  States that he thinks maybe it's his gallbladder.  No correlation to food and pain that he can recall.  No pre-er treatment or meds.  No other c/o.  Pertinent Past Medical History:  See nursing     Review of Systems     The pertinent positive and negative symptoms are as per HPI. All other systems reviewed and are negative.    Patient History      Past Medical History:  Past Medical History:   Diagnosis Date    High cholesterol     HTN (hypertension)     Meniere disease     Wears glasses        Past Surgical History:  History reviewed. No pertinent surgical history.    Family History:  Family Medical History:    None           Social History:  Social History     Tobacco Use    Smoking status: Never   Substance Use Topics    Alcohol use: Yes     Comment: occ    Drug use: No     Social History     Substance and Sexual Activity   Drug Use No       Medications:  No outpatient medications have been marked as taking for the 08/27/23 encounter [facility] Encounter).       Allergies:   No Known Allergies    Physical Exam     Vital Signs:  ED Triage Vitals [08/27/23 0910]   BP (Non-Invasive) (!) 174/94   Heart Rate 65   Respiratory Rate 18   Temperature  37 C (98.6 F)   SpO2 96 %   Weight 135 kg (297 lb 6.4 oz)   Height 1.727 m (5\' 8" )       The initial visit vital signs are reviewed as above.     Pulse Ox: 96% on None (Room Air); interpreted by me JW:[id number]    GENERAL:  This is a well appearing 57yom patient who is interactive, appropriate and showing no outward signs of distress.  Non-toxic.  HEAD:  Atraumatic, normocephalic.  HEENT:  Conjunctival WNL, no gross deformity noted.  Mucous membranes moist.  NECK:  Supple, no meningeal signs.  HEART: RRR  LUNGS : CTA  ABDOMEN:  Soft.  Tenderness to the epigastric area.  No peritoneal signs present.  BACK:  No CVA discomfort present.  SKIN:  Warm, good color.  No rashes noted.  NEURO/PSYCH:  Patient is interactive, appropriate and grossly intact.    Diagnostics     Labs:    Results for orders placed or performed  during the hospital encounter of 08/27/23   COMPREHENSIVE METABOLIC PANEL, NON-FASTING   Result Value Ref Range    SODIUM 137 136 - 145 mmol/L    POTASSIUM 4.3 3.5 - 5.1 mmol/L    CHLORIDE 105 96 - 111 mmol/L    CO2 TOTAL 24 22 - 30 mmol/L    ANION GAP 8 4 - 13 mmol/L    BUN 16 8 - 25 mg/dL    CREATININE 1.61 0.96 - 1.35 mg/dL    BUN/CREA RATIO 21 6 - 22    ALBUMIN 3.9 3.5 - 5.0 g/dL     CALCIUM 8.7 8.6 - 04.5 mg/dL    GLUCOSE 94 65 - 409 mg/dL    ALKALINE PHOSPHATASE 56 45 - 115 U/L    ALT (SGPT) 21 10 - 55 U/L    AST (SGOT)  16 8 - 45 U/L    BILIRUBIN TOTAL 1.4 (H) 0.3 - 1.3 mg/dL    PROTEIN TOTAL 7.2 6.4 - 8.3 g/dL    ESTIMATED GFR - MALE >90 >=60 mL/min/BSA   LIPASE   Result Value Ref Range    LIPASE 27 10 - 60 U/L   URINALYSIS WITH MICROSCOPIC REFLEX IF INDICATED [facility] ONLY   Result Value Ref Range    COLOR Yellow Light Yellow, Straw, Yellow    APPEARANCE Clear Clear    PH 7.0 <8.0    LEUKOCYTES Negative Negative WBCs/uL    NITRITE Negative Negative    PROTEIN Negative Negative, 10  mg/dL    GLUCOSE Negative Negative mg/dL    KETONES Negative Negative, 100  mg/dL    UROBILINOGEN 1.0 <=8.1 mg/dL     BILIRUBIN Negative Negative mg/dL    BLOOD Negative Negative mg/dL    SPECIFIC GRAVITY 1.914 <1.022   CBC WITH DIFF   Result Value Ref Range    WBC 7.0 3.7 - 11.0 x10^3/uL    RBC 5.42 4.50 - 6.10 x10^6/uL    HGB 16.0 13.4 - 17.5 g/dL    HCT 78.2 95.6 - 21.3 %    MCV 86.5 78.0 - 100.0 fL    MCH 29.5 26.0 - 32.0 pg    MCHC 34.1 31.0 - 35.5 g/dL    RDW-CV 08.6 57.8 - 46.9 %    PLATELETS 190 150 - 400 x10^3/uL    MPV 9.1 8.7 - 12.5 fL    NEUTROPHIL % 55.0 %    LYMPHOCYTE % 31.4 %    MONOCYTE % 9.6 %    EOSINOPHIL % 3.0 %    BASOPHIL % 0.7 %    NEUTROPHIL # 3.85 1.50 - 7.70 x10^3/uL    LYMPHOCYTE # 2.20 1.00 - 4.80 x10^3/uL    MONOCYTE # 0.67 0.20 - 1.10 x10^3/uL    EOSINOPHIL # 0.21 <=0.50 x10^3/uL    BASOPHIL # <0.10 <=0.20 x10^3/uL    IMMATURE GRANULOCYTE % 0.3 0.0 - 1.0 %    IMMATURE GRANULOCYTE # <0.10 <0.10 x10^3/uL   TROPONIN-I   Result Value Ref Range    TROPONIN-I HS <2.7 <=35.0 ng/L ng/L     Labs reviewed and interpreted by me.    Radiology:   CT ABDOMEN PELVIS W IV CONTRAST:  IMPRESSION:  1. A 4.7 x 3.1 x 4 cm partially exophytic solid mass lateral aspect midpole right kidney. Findings compatible with malignancy until proven otherwise.  2. Nonobstructing bilateral renal calculi.  3. Diverticulosis large bowel without pericolonic inflammatory changes.  4. Enlarged prostate. Correlate with PSA and erect exam.  5.  Small right inguinal hernia without bowel  Imaging interpreted by radiologist and report reviewed by me.    EKG interpretation:  12-Lead EKG interpreted by Dr. Erin Sons reveals sinus rhythm without acute ST/T wave changes present    ED Progress Note/Medical Decision Making       Orders Placed This Encounter    CT ABDOMEN PELVIS W IV CONTRAST    CBC/DIFF    COMPREHENSIVE METABOLIC PANEL, NON-FASTING    LIPASE    URINALYSIS WITH MICROSCOPIC REFLEX IF INDICATED [facility] ONLY    RAINBOW DRAW - [facility] ONLY    CBC WITH DIFF    BLUE TOP TUBE    GOLD TOP TUBE    RED TOP TUBE    CANCELED: Green Tube    LIGHT  GREEN TOP TUBE    TROPONIN-I    PSA DIAGNOSTIC WITH FREE PSA REFLEX    Refer to [facility]    ECG 12-Lead    INSERT & MAINTAIN PERIPHERAL IV ACCESS    NS flush syringe    iopamidol (ISOVUE-370) 76% infusion    GI cocktail (ANTACID SUSP, HYOSCYAMINE, LIDOCAINE) oral solution       ED Course as of 08/27/23 1239   Thu Aug 27, 2023   0943 URINALYSIS WITH MICROSCOPIC REFLEX IF INDICATED [facility] ONLY  No blood or infection    1049 CBC/DIFF  WBC was 7 and without shift.  H&H normal.   1049 COMPREHENSIVE METABOLIC PANEL, NON-FASTING(!)  Bilirubin is 1.4   1049 LIPASE  Lipase is 27   1049 TROPONIN-I  Troponin is <2.7   1050 ECG 12-Lead  EKG showed NSR without acute ST/T wave changes   1108 CT ABDOMEN PELVIS W IV CONTRAST  IMPRESSION:  1. A 4.7 x 3.1 x 4 cm partially exophytic solid mass lateral aspect midpole right kidney. Findings compatible with malignancy until proven otherwise.  2. Nonobstructing bilateral renal calculi.  3. Diverticulosis large bowel without pericolonic inflammatory changes.  4. Enlarged prostate. Correlate with PSA and erect exam.  5. Small right inguinal hernia without bowel     1210 Findings discussed with Dr Ellison Hughs by Dr Erin Sons.  Related that the patient would need follow up with urology.  Will place order for outpatient follow up.  Patient is aware of the same and will be sure he gets a call for f/u or will call them.  They asked that PSA be added to today's labs.  WIll be CT chest and PET scan thru PMD.  Will also need biopsy by urology.       Medical Decision Making  Patient was evaluated.  Patient here for epigastric pain.  States that it started yesterday.  States that he thinks it's his gallbladder.  Denies CP, SOB, flank pain.  Denies fever/chills.  Denies n/v/d/c.  Denies urinary symptoms.  Patient labs reassuring.  CT showed no acute findings, incidental findings of kidney mass that will require further testing.  Patient discussed with oncology who relates patient will need urology  f/u for testing of mass.  Will also need outpatient CT chest and PET scan.  Asked PSA be added to today's labs (this was ordered).  Patient given GI cocktail for treatment of discomfort with success.  To have PMD and urology f/u and strict return precautions discussed in detail.  Patient related understanding to treatment and plan and had no questions or concerns at discharge.  Discharged to home in stable condition.    Problems Addressed:  Abdominal pain, unspecified abdominal location: acute  illness or injury  Kidney mass:     Details: Incidental finding    Amount and/or Complexity of Data Reviewed  Independent Historian: spouse  Labs: ordered. Decision-making details documented in ED Course.  Radiology: ordered and independent interpretation performed. Decision-making details documented in ED Course.  ECG/medicine tests: ordered and independent interpretation performed. Decision-making details documented in ED Course.    Risk  Prescription drug management.        Supervising physician:  Dr. Erin Sons      Pre-Disposition Vitals:  Filed Vitals:    08/27/23 0910   BP: (!) 174/94   Pulse: 65   Resp: 18   Temp: 37 C (98.6 F)   SpO2: 96%       Clinical Impression      Acute abdominal pain  Kidney mass, incidental finding    Plan/Disposition     Discharged    Prescriptions:     Current Discharge Medication List        CONTINUE these medications - NO CHANGES were made during your visit.        Details   aspirin 325 mg Tablet   325 mg, Oral, DAILY  Refills: 0     atorvastatin 20 mg Tablet  Commonly known as: LIPITOR   20 mg, Oral, EVERY EVENING  Refills: 0     doxycycline 100 mg Tablet   100 mg, Oral, 2 TIMES DAILY  Qty: 42 Tab  Refills: 0     meclizine 25 mg Tablet  Commonly known as: ANTIVERT   25 mg, Oral, 2 TIMES DAILY PRN  Refills: 0     mirabegron 25 mg Tablet Sustained Release 24 hr  Commonly known as: MYRBETRIQ   25 mg, Oral, DAILY  Refills: 0     olmesartan 20 mg Tablet  Commonly known as: BENICAR   20 mg, Oral,  DAILY  Refills: 0     raNITIdine 300 mg Tablet  Commonly known as: ZANTAC   300 mg, Oral, EVERY EVENING  Refills: 0              Follow Up:  Deboraha Sprang, MD  [address]  [place] [address]  [phone]    Schedule an appointment as soon as possible for a visit       Wandra Feinstein, MD  [address]  [address]  [place] [address]  [phone]    Schedule an appointment as soon as possible for a visit       Myer Peer, MD  [address], [address]  [place] [address]  [phone]    Schedule an appointment as soon as possible for a visit       [facility] - Emergency Department  [facility]  [place] [address]  [phone]    If symptoms worsen          Condition on Disposition: Stable

## 2023-08-27 NOTE — ED Nurses Note (Signed)
Ambulated to CT with cane.

## 2023-08-27 NOTE — Discharge Instructions (Signed)
You will require further outpatient workup for incidental finding of kidney mass on CT.  You will need urology f/u for testing of mass.  You will need further imaging (CT chest and PET scan) thru PMD.

## 2023-08-27 NOTE — ED Nurses Note (Signed)
Patient discharged home with family.  AVS reviewed with patient/care giver.  A written copy of the AVS and discharge instructions was given to the patient/care giver.  Questions sufficiently answered as needed.  Patient/care giver encouraged to follow up with PCP as indicated.  In the event of an emergency, patient/care giver instructed to call 911 or go to the nearest emergency room.      Current Discharge Medication List        CONTINUE these medications - NO CHANGES were made during your visit.        Details   aspirin 325 mg Tablet   Take 325 mg by mouth Once a day  Refills: 0     atorvastatin 20 mg Tablet  Commonly known as: LIPITOR   Take 20 mg by mouth Every evening  Refills: 0     doxycycline 100 mg Tablet   Take 1 Tab (100 mg total) by mouth Twice daily  Qty: 42 Tab  Refills: 0     meclizine 25 mg Tablet  Commonly known as: ANTIVERT   Take 25 mg by mouth Twice per day as needed  Refills: 0     mirabegron 25 mg Tablet Sustained Release 24 hr  Commonly known as: MYRBETRIQ   Take 25 mg by mouth Once a day  Refills: 0     olmesartan 20 mg Tablet  Commonly known as: BENICAR   Take 20 mg by mouth Once a day  Refills: 0     raNITIdine 300 mg Tablet  Commonly known as: ZANTAC   Take 300 mg by mouth Every evening  Refills: 0

## 2023-08-27 NOTE — ED Triage Notes (Addendum)
Pt arrives c/o midabdominal pain that began around 1500 yesterday. States pain radiates to his back. Denies nausea, vomiting, or fevers. Having normal urine output. Reports increased burping. Denies CP or SOB. States he still has all abdominal organs. Hx of HTN, GERD, and meniere's disease, took his medication today.

## 2023-08-31 ENCOUNTER — Other Ambulatory Visit: Payer: Self-pay

## 2023-08-31 DIAGNOSIS — R9431 Abnormal electrocardiogram [ECG] [EKG]: Secondary | ICD-10-CM

## 2023-08-31 DIAGNOSIS — I517 Cardiomegaly: Secondary | ICD-10-CM

## 2023-08-31 LAB — ECG 12-LEAD
Atrial Rate: 65 {beats}/min
Calculated P Axis: 47 degrees
Calculated R Axis: -33 degrees
Calculated T Axis: 17 degrees
PR Interval: 156 ms
QRS Duration: 96 ms
QT Interval: 392 ms
QTC Calculation: 407 ms
Ventricular rate: 65 {beats}/min

## 2023-09-01 ENCOUNTER — Ambulatory Visit: Payer: Medicare (Managed Care) | Attending: Urology | Admitting: Urology

## 2023-09-01 ENCOUNTER — Encounter (INDEPENDENT_AMBULATORY_CARE_PROVIDER_SITE_OTHER): Payer: Self-pay | Admitting: Urology

## 2023-09-01 VITALS — BP 190/115 | HR 73 | Temp 98.7°F | Ht 68.0 in | Wt 299.4 lb

## 2023-09-01 DIAGNOSIS — K4090 Unilateral inguinal hernia, without obstruction or gangrene, not specified as recurrent: Secondary | ICD-10-CM | POA: Insufficient documentation

## 2023-09-01 DIAGNOSIS — Z841 Family history of disorders of kidney and ureter: Secondary | ICD-10-CM

## 2023-09-01 DIAGNOSIS — K5730 Diverticulosis of large intestine without perforation or abscess without bleeding: Secondary | ICD-10-CM | POA: Insufficient documentation

## 2023-09-01 DIAGNOSIS — Z87442 Personal history of urinary calculi: Secondary | ICD-10-CM | POA: Insufficient documentation

## 2023-09-01 DIAGNOSIS — Z8042 Family history of malignant neoplasm of prostate: Secondary | ICD-10-CM | POA: Insufficient documentation

## 2023-09-01 DIAGNOSIS — N400 Enlarged prostate without lower urinary tract symptoms: Secondary | ICD-10-CM | POA: Insufficient documentation

## 2023-09-01 DIAGNOSIS — Z87448 Personal history of other diseases of urinary system: Secondary | ICD-10-CM

## 2023-09-01 DIAGNOSIS — N2889 Other specified disorders of kidney and ureter: Secondary | ICD-10-CM | POA: Insufficient documentation

## 2023-09-01 NOTE — H&P (Signed)
[name]  [address]  [place], [place] [address]    New Office Visit    SUBJECTIVE:    Chief Complaint:   Chief Complaint   Patient presents with    Mass     4.7 x 3.1 x 4 cm partially exophytic solid mass lateral aspect midpole right kidney. Findings compatible with malignancy until proven otherwise.  Nonobstructing bilateral renal calculi. nonobstructing 3 mm calculus lower pole right kidney. Enlarged prostate          History of Present Illness:    Sean Fernandez 57 y.o. male w/ hx of HTN and facial melanoma presents for evaluation of renal mass. Pt reported to the ER with upper abdominal pain last week on 08/27/2023. He reported the epigastric pain began the day before with some radiation of the pain into his back.    Contrast CT Ab Pelvis done in the ER showed a 4.7 cm partially exophytic solid mass lateral aspect midpole right kidney concerning for malignancy and urology follow up was recommended. Normal left kidney. Calculated renal nephrometry score is 8-9 which means intermediate complexity.    Pt reports he takes tolterodine for hx of urge incontinence. He states he had a blocked urethra as a child and was hospitalized with a severe infection. This was surgically repaired in [place]. He also has a hx of kidney stones and has passed stones before.    He reports his father had prostate cancer that spread to his bladder and his father also had renal disease. His mother also had tumors.    U/A today shows trace protein. PVR is 66 mL.    Past Medical History:    Past Medical History:   Diagnosis Date    High cholesterol     HTN (hypertension)     Meniere disease     Wears glasses            Past Surgical History:  History reviewed. No pertinent surgical history.         Allergies:  Allergies   Allergen Reactions    Lisinopril      Coughing, per pt       Home Medications:  Prior to Admission medications    Medication Sig Start Date End Date Taking? Authorizing Provider    aspirin 325 mg Oral Tablet Take 1 Tablet (325 mg total) by mouth Once a day   Yes Provider, Historical   atorvastatin (LIPITOR) 20 mg Oral Tablet Take 1 Tablet (20 mg total) by mouth Every evening   Yes Provider, Historical   meclizine (ANTIVERT) 25 mg Oral Tablet Take 1 Tablet (25 mg total) by mouth Twice per day as needed    Provider, Historical   mirabegron (MYRBETRIQ) 25 mg Oral Tablet Sustained Release 24 hr Take 1 Tablet (25 mg total) by mouth Once a day   Yes Provider, Historical   olmesartan (BENICAR) 20 mg Oral Tablet Take 1 Tablet (20 mg total) by mouth Once a day   Yes Provider, Historical   Ranitidine HCl (ZANTAC) 300 mg Oral Tablet Take 1 Tablet (300 mg total) by mouth Every evening   Yes Provider, Historical   doxycycline 100 mg Oral Tablet Take 1 Tab (100 mg total) by mouth Twice daily 03/02/15 09/01/23  Kara Pacer, MD       Social History:  Social History     Tobacco Use    Smoking status: Never   Substance Use Topics    Alcohol use:  Yes     Comment: occ    Drug use: No       Family History:  Family Medical History:    None           Review of Systems:  Negative, except as mentioned in HPI.    OBJECTIVE:    Vitals:  BP (!) 190/115   Pulse 73   Temp 37.1 C (98.7 F)   Ht 1.727 m (5\' 8" )   Wt 136 kg (299 lb 6.4 oz)   BMI 45.52 kg/m         Physical examination:  Constitutional: Appears well, no acute distress.   GU: Deferred    Data Reviewed:  CT ABDOMEN PELVIS W IV CONTRAST - 08/27/2023  IMPRESSION:  1. A 4.7 x 3.1 x 4 cm partially exophytic solid mass lateral aspect midpole right kidney. Findings compatible with malignancy until proven otherwise.  2. Nonobstructing bilateral renal calculi.  3. Diverticulosis large bowel without pericolonic inflammatory changes.  4. Enlarged prostate. Correlate with PSA and erect exam.  5. Small right inguinal hernia without bowel    Urine Dip Results:  Time collected: 1448  Glucose (Ref Range: Negative mg/dL): Negative  Bilirubin (Ref Range: Negative  mg/dL): Negative  Ketones (Ref Range: Negative mg/dL): Negative  Urine Specific Gravity (Ref Range: 1.005 - 1.030): 1.020  Blood (urine) (Ref Range: Negative mg/dL): Negative  pH (Ref Range: 5.0 - 8.0): 7.0  Protein (Ref Range: Negative mg/dL): (!) Trace  Urobilinogen (Ref Range: Normal): 1.0 mg/dL (Normal)  Nitrite (Ref Range: Negative): Negative  Leukocytes (Ref Range: Negative WBC's/uL): Negative    PVR Volume: 66mL    Assessment and Plan:      ICD-10-CM    1. Right renal mass  N28.89 Refer to [facility]     MRI KIDNEY MASS W/WO CONTRAST     CT CHEST WO IV CONTRAST      2. Kidney mass  N28.89 POCT URINE DIPSTICK     POCT PVR      3. Enlarged prostate  N40.0 POCT URINE DIPSTICK     POCT PVR         No problem-specific Assessment & Plan notes found for this encounter.    Orders Placed This Encounter    MRI KIDNEY MASS W/WO CONTRAST    CT CHEST WO IV CONTRAST    Refer to [facility]    POCT URINE DIPSTICK    POCT PVR     - Pt w/ CT done in the ER showing suspicious renal mass. Calculated renal nephrometry score is 8-9 which means at least intermediate complexity. Discussed treatment options today of partial nephrectomy versus full nephrectomy. Pt advised kidney sparing partial nephrectomy could be an option; however, given the complexity of this case if he prefers partial nephrectomy I recommend referral to outside hospital which specializes in this (such as referral to [facility]). Renal biopsy could also be considered; however, even if biopsy is negative, pt advised biopsy result would not necessarily be definitive. In the meantime, we will plan for staging studies to rule out any presence of metastatic disease.    Follow up to discuss imaging or per direction of [facility] urologic oncology team.    Dr. Irene Shipper. Juliane Poot, MD  [facility]    I am in the presence of and scribing for Dr. Irene Shipper. Juliane Poot, MD, for services provided on 09/01/2023.  Concha Norway,  SCRIBE  I personally performed the services described in this  documentation, as scribed  in my presence, and it is both accurate  and complete.    Myer Peer, MD

## 2023-09-01 NOTE — Nursing Note (Signed)
09/01/23 1500   Urine test  (Siemens Multistix 10 SG)   PVR Volume 66mL   Initials tbm

## 2023-09-01 NOTE — Nursing Note (Signed)
09/01/23 1400   Required: Location Test Performed At:   [facility] [address], [place], [place] [address]   Urine test  (Siemens Multistix 10 SG)   Performed Status: Automated   Time collected 1448   Color (Ref Range: Yellow) Yellow   Clarity (Ref Range: Clear) Clear   Glucose (Ref Range: Negative mg/dL) Negative   Bilirubin (Ref Range: Negative mg/dL) Negative   Ketones (Ref Range: Negative mg/dL) Negative   Urine Specific Gravity (Ref Range: 1.005 - 1.030) 1.020   Blood (urine) (Ref Range: Negative mg/dL) Negative   pH (Ref Range: 5.0 - 8.0) 7.0   Protein (Ref Range: Negative mg/dL) (!) Trace   Urobilinogen (Ref Range: Normal) 1.0 mg/dL (Normal)   Nitrite (Ref Range: Negative) Negative   Leukocytes (Ref Range: Negative WBC's/uL) Negative   Bottle Number   (Siemens Multistix 10 SG) [id number]   Lot # [id number]   Expiration Date 07/29/24   Initials tbm

## 2023-09-08 ENCOUNTER — Ambulatory Visit (HOSPITAL_BASED_OUTPATIENT_CLINIC_OR_DEPARTMENT_OTHER): Payer: Self-pay | Admitting: Family

## 2023-09-08 NOTE — Nursing Note (Signed)
Summary: General Other    Copied From CRM [id number].  [name] called with a general question or another reason.  Sean Fernandez pt is wanting to keep his appt on 12.23.24 as an in person.

## 2023-09-14 ENCOUNTER — Other Ambulatory Visit: Payer: Self-pay

## 2023-09-16 ENCOUNTER — Other Ambulatory Visit: Payer: Self-pay

## 2023-09-16 ENCOUNTER — Other Ambulatory Visit (INDEPENDENT_AMBULATORY_CARE_PROVIDER_SITE_OTHER): Payer: Self-pay | Admitting: Urology

## 2023-09-16 DIAGNOSIS — N2889 Other specified disorders of kidney and ureter: Secondary | ICD-10-CM

## 2023-09-16 NOTE — Progress Notes (Signed)
Specialty Pharmacy Refill Coordination Note  Travis Wheeler is a 57 y.o. male contacted today regarding refills of specialty medication(s) Bictegravir-Emtricitab-Tenofov [name])   Patient requested Delivery   Delivery date: 09/21/23   Verified address: [address]   [place] [address]   Medication will be filled on 09/18/23.

## 2023-09-16 NOTE — Progress Notes (Signed)
Specialty Pharmacy Ongoing Clinical Assessment Note  Travis Wheeler is a 57 y.o. male who is being followed by the specialty pharmacy service for RxSp HIV   Patient's specialty medication(s) reviewed today: Bictegravir-Emtricitab-Tenofov (Biktarvy)   Missed doses in the last 4 weeks: 0   Patient/Caregiver did not have any additional questions or concerns.   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 Patient will maintain adherence. Viral load remains undetectable long term.          Follow up:  6 months  Otto Herb Specialty Pharmacist

## 2023-09-18 ENCOUNTER — Other Ambulatory Visit: Payer: Self-pay

## 2023-09-21 ENCOUNTER — Other Ambulatory Visit: Payer: Self-pay

## 2023-09-21 ENCOUNTER — Ambulatory Visit: Payer: Medicare (Managed Care) | Attending: Urology | Admitting: Family

## 2023-09-21 ENCOUNTER — Encounter (INDEPENDENT_AMBULATORY_CARE_PROVIDER_SITE_OTHER): Payer: Self-pay | Admitting: Urology

## 2023-09-21 DIAGNOSIS — N2889 Other specified disorders of kidney and ureter: Secondary | ICD-10-CM

## 2023-09-21 NOTE — Cancer Center Note (Signed)
UROLOGY ONCOLOGY, [facility]  [address]  [place] [address]  Operated by [facility]  Telephone Visit    Name:  Sean Fernandez MRN: [id number]   Date:  09/21/2023 Age:   57 y.o.     The patient/family initiated a request for telephone service.  Verbal consent for this service was obtained from the patient/family.    Last office visit in this department: Visit date not found      Reason for call:   Indeterminate right renal mass (cT1b)    Call notes:  I was able to make contact with the patient to establish with urology oncology for an incidentally found right renal mass on CT abdomen for abdominal pain. Right renal mass measures 4.7 cm and is suspicious for malignancy. Creatinine is 0.78. He has right sided abdominal pain but states this may be related to his gallbladder. He denies any GU complaints.     Plan:  Patient's imaging was reviewed at Radiology Review Board with recommendation for radical vs partial nephrectomy with CT chest and CT renal multiphase for surgical planning.   I offered referral to local hospital system, [facility], due to increased patient volume at [facility], patient declined.   Plan for CT chest, CT renal multiphase, CBC, and CMP at next available with Dr. Cordelia Poche.       ICD-10-CM    1. Right renal mass  N28.89 CT CHEST WO IV CONTRAST     CT MULTIPHASE RENAL SURGICAL PLANNING     CBC     COMPREHENSIVE METABOLIC PANEL, NON-FASTING          Total provider time spent with the patient on the phone: 15 minutes.    Limmie Patricia, FNP-C

## 2023-10-01 ENCOUNTER — Ambulatory Visit (HOSPITAL_COMMUNITY): Payer: Self-pay

## 2023-10-08 ENCOUNTER — Other Ambulatory Visit (HOSPITAL_COMMUNITY): Payer: Self-pay

## 2023-10-08 ENCOUNTER — Other Ambulatory Visit: Payer: Self-pay

## 2023-10-08 NOTE — Progress Notes (Signed)
 Specialty Pharmacy Refill Coordination Note  Travis Wheeler is a 58 y.o. male contacted today regarding refills of specialty medication(s) Bictegravir-Emtricitab-Tenofov (Biktarvy )   Patient requested Delivery   Delivery date: 10/14/23   Verified address: [address]   Medication will be filled on 10/13/23.

## 2023-10-12 ENCOUNTER — Ambulatory Visit (INDEPENDENT_AMBULATORY_CARE_PROVIDER_SITE_OTHER): Payer: Medicare (Managed Care) | Admitting: Student in an Organized Health Care Education/Training Program

## 2023-10-12 ENCOUNTER — Other Ambulatory Visit: Payer: Self-pay

## 2023-10-12 ENCOUNTER — Encounter (INDEPENDENT_AMBULATORY_CARE_PROVIDER_SITE_OTHER): Payer: Self-pay | Admitting: Student in an Organized Health Care Education/Training Program

## 2023-10-12 VITALS — BP 157/99 | HR 89 | Temp 100.2°F | Ht 68.0 in | Wt 295.0 lb

## 2023-10-12 DIAGNOSIS — R0981 Nasal congestion: Secondary | ICD-10-CM

## 2023-10-12 DIAGNOSIS — R051 Acute cough: Secondary | ICD-10-CM

## 2023-10-12 DIAGNOSIS — J209 Acute bronchitis, unspecified: Secondary | ICD-10-CM

## 2023-10-12 DIAGNOSIS — R52 Pain, unspecified: Secondary | ICD-10-CM

## 2023-10-12 DIAGNOSIS — R509 Fever, unspecified: Secondary | ICD-10-CM

## 2023-10-12 LAB — POC COVID-19, FLU A/B, RSV RAPID BY PCR (RESULTS)
INFLUENZA VIRUS A, PCR 4PLEX, POC: NEGATIVE
INFLUENZA VIRUS B, PCR 4PLEX, POC: NEGATIVE
RSV, PCR 4PLEX, POC: NEGATIVE
SARS-COV-2, POC: NEGATIVE

## 2023-10-12 MED ORDER — DOXYCYCLINE HYCLATE 100 MG CAPSULE
100.0000 mg | ORAL_CAPSULE | Freq: Two times a day (BID) | ORAL | 0 refills | Status: DC
Start: 2023-10-12 — End: 2023-11-04

## 2023-10-12 NOTE — Progress Notes (Signed)
[facility]  URGENT CARE, [facility]  [address] [place] [address]  [phone]  Today's date: 10/12/23    Patient Name: Ladd Glawson  Patient DOB: 1966/06/15      SUBJECTIVE:   Grafton Lehrke is a 58 y.o. male who complains of body aches, cough, chest congestion, sinus drainage and pressure with fevers since yesterday.  His wife was recently diagnosed and treated for pneumonia in the past 2 weeks.  His symptoms have been worsening since that time.   Denies change/loss of taste or smell.  He denies a history of wheezing, shortness of breath, and chest pain.   He denies a history of asthma.  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    PMH:  Current Outpatient Medications   Medication Sig    aspirin 325 mg Oral Tablet Take 1 Tablet (325 mg total) by mouth Once a day    atomoxetine (STRATTERA) 40 mg Oral Capsule Take 1 Capsule (40 mg total) by mouth Once a day    atorvastatin (LIPITOR) 20 mg Oral Tablet Take 1 Tablet (20 mg total) by mouth Every evening    cloNIDine (CATAPRES-TTS) 0.1 mg/24 hr Transdermal Patch Weekly Place 1 Patch (0.1 mg total) on the skin Every 7 days    doxycycline hyclate (VIBRAMYCIN) 100 mg Oral Capsule Take 1 Capsule (100 mg total) by mouth Twice daily for 10 days    losartan (COZAAR) 100 mg Oral Tablet Take 1 Tablet (100 mg total) by mouth Once a day    meclizine (ANTIVERT) 25 mg Oral Tablet Take 1 Tablet (25 mg total) by mouth Twice per day as needed    omeprazole (PRILOSEC) 40 mg Oral Capsule, Delayed Release(E.C.) Take 1 Capsule (40 mg total) by mouth Once a day    pentoxifylline (TRENTAL) 400 mg Oral Tablet Sustained Release Take 1 Tablet (400 mg total) by mouth Three times a day    pregabalin (LYRICA) 75 mg Oral Capsule Take 1 Capsule (75 mg total) by mouth Three times a day    scopolamine (TRANSDERM SCOP) 1 mg over 3 days Transdermal patch 3 day Place 1 Patch on the skin Every 72 hours    tolterodine (DETROL LA) 4 mg Oral  Capsule, Sust. Release 24 hr Take 1 Capsule (4 mg total) by mouth Once a day    vitamin D3-folic acid 250 mcg (10,000 unit)-1 mg Oral Tablet Take 1 Tablet by mouth Once a day     Allergies   Allergen Reactions    Lisinopril      Coughing, per pt     Past Medical History:   Diagnosis Date    CPAP (continuous positive airway pressure) dependence     High cholesterol     HTN (hypertension)     Meniere disease     Wears glasses          Past Surgical History:   Procedure Laterality Date    HX MENISCECTOMY Right     URETHRA SURGERY           Family Medical History:    None         Social History     Socioeconomic History    Marital status: Married   Tobacco Use    Smoking status: Never   Substance and Sexual Activity    Alcohol use: Yes     Comment: occ    Drug use: No     Social Determinants of Health  Received from [facility]    Intimate Partner Violence     Social History     Tobacco Use   Smoking Status Never   Smokeless Tobacco Not on file       OBJECTIVE:  BP (!) 157/99   Pulse 89   Temp 37.9 C (100.2 F) (Tympanic)   Ht 1.727 m (5\' 8" )   Wt 134 kg (295 lb)   SpO2 96%   BMI 44.85 kg/m     Appearance: in no apparent distress, well developed and well nourished, non-toxic, in no respiratory distress and acyanotic, and alert.   Skin:  Clean. Dry and warm to touch. No rash on arms or face  ENT: bilateral TM normal without fluid or infection, throat normal without erythema or exudate, post nasal drip noted, and nasal mucosa congested.   Cardiovascular:    Heart regular rate and rhythm  Pulmonary Lungs: non-labored breathing, clear to auscultation bilaterally.  No rales, wheezes or rhonchi appreciated.       ASSESSMENT/PLAN    ICD-10-CM    1. Acute bronchitis, unspecified organism  J20.9 doxycycline hyclate (VIBRAMYCIN) 100 mg Oral Capsule      2. Acute cough  R05.1 POC COVID-19, FLU A/B, RSV RAPID BY PCR (RESULTS)      3. Body aches  R52 POC COVID-19, FLU A/B, RSV RAPID BY PCR (RESULTS)       4. Fever, unspecified fever cause  R50.9 POC COVID-19, FLU A/B, RSV RAPID BY PCR (RESULTS)      5. Nasal congestion  R09.81 POC COVID-19, FLU A/B, RSV RAPID BY PCR (RESULTS)        Medication Orders   Medications    doxycycline hyclate (VIBRAMYCIN) 100 mg Oral Capsule     Sig: Take 1 Capsule (100 mg total) by mouth Twice daily for 10 days     Dispense:  20 Capsule     Refill:  0       Acute bronchitis  Cepheid 4 Plex obtained (COVID, RSV, Flu A/B) all negative  Snap Rx for doxycycline sent (recent diagnosis kidney cancer and pneumonia exposure)  Discussed that viral respiratory illness typically peak on day 3-5 before improvement is noted and run a course of 7-14 days.  Symptomatic therapy suggested: push fluids, rest, (vitamin C, vitamin D and zinc if appropriate reviewed), gargle warm salt water, use vaporizer or mist prn and use acetaminophen, cough suppressant of choice prn.   Call or return to clinic prn if these symptoms worsen or fail to improve as anticipated.  Concerning symptoms and red flags reviewed to f/u in the ER.    Portions of this note may be dictated using voice recognition software or a dictation service. Variances in spelling and vocabulary are possible and unintentional. Not all errors are caught/corrected. Please notify the Thereasa Parkin if any discrepancies are noted or if the meaning of any statement is not clear.     Fredricka Bonine, MD

## 2023-10-13 ENCOUNTER — Other Ambulatory Visit: Payer: Self-pay

## 2023-10-26 ENCOUNTER — Other Ambulatory Visit (HOSPITAL_COMMUNITY): Payer: Self-pay

## 2023-10-28 ENCOUNTER — Other Ambulatory Visit (HOSPITAL_COMMUNITY): Payer: Self-pay

## 2023-10-28 ENCOUNTER — Other Ambulatory Visit: Payer: Self-pay

## 2023-10-28 ENCOUNTER — Other Ambulatory Visit (HOSPITAL_COMMUNITY): Payer: Self-pay | Admitting: Pharmacy Technician

## 2023-10-28 ENCOUNTER — Other Ambulatory Visit: Payer: Self-pay | Admitting: Pharmacist

## 2023-10-28 DIAGNOSIS — B20 Human immunodeficiency virus [HIV] disease: Secondary | ICD-10-CM

## 2023-10-28 MED ORDER — BIKTARVY 50-200-25 MG PO TABS
1.0000 | ORAL_TABLET | Freq: Every day | ORAL | 1 refills | Status: DC
Start: 2023-10-28 — End: 2023-12-23
  Filled 2023-10-28: qty 30, 30d supply, fill #0
  Filled 2023-11-26: qty 30, 30d supply, fill #1

## 2023-10-28 NOTE — Progress Notes (Signed)
Specialty Pharmacy Refill Coordination Note  Travis Wheeler is a 57 y.o. male contacted today regarding refills of specialty medication(s) Bictegravir-Emtricitab-Tenofov [name])   Patient requested Delivery   Delivery date: 11/09/23   Verified address: Patient address [address]  [place]   Medication will be filled on 11/06/23.  Refill Request sent to MD

## 2023-10-30 ENCOUNTER — Encounter (HOSPITAL_BASED_OUTPATIENT_CLINIC_OR_DEPARTMENT_OTHER): Payer: Self-pay | Admitting: Family

## 2023-11-04 ENCOUNTER — Ambulatory Visit (HOSPITAL_BASED_OUTPATIENT_CLINIC_OR_DEPARTMENT_OTHER)
Admission: RE | Admit: 2023-11-04 | Discharge: 2023-11-04 | Disposition: A | Discharge status: Home / Self Care | Payer: Medicare (Managed Care) | Source: Ambulatory Visit

## 2023-11-04 ENCOUNTER — Other Ambulatory Visit: Payer: Self-pay

## 2023-11-04 ENCOUNTER — Encounter (HOSPITAL_BASED_OUTPATIENT_CLINIC_OR_DEPARTMENT_OTHER): Payer: Self-pay | Admitting: Urology

## 2023-11-04 ENCOUNTER — Ambulatory Visit: Payer: Medicare (Managed Care) | Attending: Urology | Admitting: Urology

## 2023-11-04 ENCOUNTER — Ambulatory Visit (HOSPITAL_COMMUNITY)
Admission: RE | Admit: 2023-11-04 | Discharge: 2023-11-04 | Disposition: A | Discharge status: Home / Self Care | Payer: Medicare (Managed Care) | Source: Ambulatory Visit

## 2023-11-04 VITALS — BP 157/84 | HR 66 | Temp 98.5°F | Resp 24 | Ht 68.0 in | Wt 295.1 lb

## 2023-11-04 DIAGNOSIS — N2889 Other specified disorders of kidney and ureter: Secondary | ICD-10-CM | POA: Insufficient documentation

## 2023-11-04 DIAGNOSIS — N200 Calculus of kidney: Secondary | ICD-10-CM

## 2023-11-04 LAB — COMPREHENSIVE METABOLIC PANEL, NON-FASTING
ALBUMIN: 3.8 g/dL (ref 3.5–5.0)
ALKALINE PHOSPHATASE: 73 U/L (ref 45–115)
ALT (SGPT): 18 U/L (ref <43)
ANION GAP: 7 mmol/L (ref 4–13)
AST (SGOT): 18 U/L (ref 11–34)
BILIRUBIN TOTAL: 0.9 mg/dL (ref 0.3–1.3)
BUN/CREA RATIO: 23 — ABNORMAL HIGH (ref 6–22)
BUN: 19 mg/dL (ref 8–25)
CALCIUM: 8.9 mg/dL (ref 8.6–10.2)
CHLORIDE: 103 mmol/L (ref 96–111)
CO2 TOTAL: 24 mmol/L (ref 22–30)
CREATININE: 0.84 mg/dL (ref 0.75–1.35)
ESTIMATED GFR - MALE: >90 mL/min/BSA (ref >=60)
GLUCOSE: 99 mg/dL (ref 65–125)
POTASSIUM: 4.3 mmol/L (ref 3.5–5.1)
PROTEIN TOTAL: 6.7 g/dL (ref 6.4–8.3)
SODIUM: 134 mmol/L — ABNORMAL LOW (ref 136–145)

## 2023-11-04 LAB — CBC
HCT: 42.2 % (ref 38.9–52.0)
HGB: 14.4 g/dL (ref 13.4–17.5)
MCH: 29.6 pg (ref 26.0–32.0)
MCHC: 34.1 g/dL (ref 31.0–35.5)
MCV: 86.7 fL (ref 78.0–100.0)
MPV: 9.3 fL (ref 8.7–12.5)
PLATELETS: 188 10*3/uL (ref 150–400)
RBC: 4.87 10*6/uL (ref 4.50–6.10)
RDW-CV: 13.4 % (ref 11.5–15.5)
WBC: 5.8 10*3/uL (ref 3.7–11.0)

## 2023-11-04 LAB — POC ISTAT CREATININE (RESULT)
CREATININE, POC: 0.9 mg/dL (ref 0.75–1.35)
ESTIMATED GFR MALE: >60 mL/min/{1.73_m2} (ref >=60)

## 2023-11-04 MED ORDER — IOPAMIDOL 370 MG IODINE/ML (76 %) INTRAVENOUS SOLUTION
100.0000 mL | INTRAVENOUS | Status: AC
Start: 2023-11-04 — End: 2023-11-04
  Administered 2023-11-04: 100 mL via INTRAVENOUS

## 2023-11-04 NOTE — Cancer Center Note (Signed)
UROLOGIC ONCOLOGY SURGERY NOTE    CHIEF COMPLAINT:   New patient visit to Dr. Cordelia Poche for right renal mass     SUBJECTIVE:  58 y.o. male presents today for right renal mass found incidentally during work-up for abdominal pain. He denies any changes in his health since he was last seen in the clinic. He is interested in surgery. He reports generalized fatigue but denies any GU complaints today.     OBJECTIVE:  BP (!) 157/84   Pulse 66   Temp 36.9 C (98.5 F)   Resp (!) 24   Ht 1.727 m (5\' 8" )   Wt 134 kg (295 lb 1.6 oz)   SpO2 95%   BMI 44.87 kg/m       General: Patient is vitally stable (see values). Appears calm and at rest. Dressed appropriately.    Skin: Warm and dry.     Eyes: Eyes are clear.    Pulmonary: Respiratory effort is unlabored.     Psychiatric: Patient is alert, appropriate mood, and in no acute distress.     Musculoskeletal: Patient ambulates without assistance   Cardiovascular: Palpable peripheral pulses.    Neurologic: Neurological exam is consistent with patient's age.      Gastrointestinal: The abdomen is soft, nontender and non-distended.    Genitourinary: No costovertebral angle tenderness bilaterally.  No suprapubic fullness or tenderness.     ASSESSMENT:  58 y.o. male with:    1. Right renal mass concerning for malignancy (cT1bNxMx - pending staging)   07/2023 - CT AP: 4.7 cm right renal mass concerning for malignancy, small right inguinal hernia without bowel   10/2023 - CT chest: pending   10/2023 - CT renal multiphase: pending     2. Past medical history significant for HTN, Meniere's, neuropathy, GERD, abnormal EKG    3. Past surgical history significant for right knee surgery, urethral surgery in childhood   4. Blood thinners: ASA 325  5. ECOG: 0 - Fully active, asymptomatic    PLAN:  We discussed treatment options including partial vs radical nephrectomy. The risks and benefits of each were reviewed.   We engaged in shared decision making with the patient and he would like  to proceed with robotic assisted laparoscopic right radical nephrectomy. Consent was obtained today.   Surgical scheduler notified to secure date and time for surgery.     ATTESTATION:  This was a shared visit with Dr. Shepard General, FNP-C, 11/04/2023 14:18    I personally saw and evaluated the patient as part of a shared service with an APP. MDM (complete): NPV Level 5 (High Complexity). On the day of the encounter, I independently of the APP spent a total of 60 minutes  in direct/indirect care of this patient including initial evaluation, review of laboratory, radiology, diagnostic studies, review of medical record, examination, order entry, coordination of care, documentation, and post-visit activities. The note above has been reviewed and/or edited by me to reflect my clinical assessment and medical decision making for this patient.     We discussed that renal masses are a biologically heterogeneous group of tumors ranging from benign masses to cancers that can be indolent or aggressive. A small renal mass is defined as a small cortical lesion measuring less than 4 cm suspicious for clinical T1a renal cell carcinoma that is not cystic, fat containing, or invasive. It has been reported that 20 to 30% of renal masses measuring less than 4 cm in size are benign  or may have low to very low malignant potential.  We discussed that smaller tumors in women are more likely to be benign than in men. Spreading beyond the kidney has been reported only 3% of patients with tumors less than 4 cm and < 2% with tumors less than 3 cm.  We discussed that renal masses measuring greater than 4 cm in size are more likely to be malignant and have a higher chance of spreading to other parts of the body which may eventually cause symptoms and impact survival. Management options for renal masses include observation, renal biopsy with subsequent decision making based on biopsy results,  thermal ablation procedures, radiation  therapy, partial nephrectomy, and radical nephrectomy. We discussed the advantages and disadvantages associated with each approach as well as the local recurrence and metastatic progression associated with each strategy. Educational information and our office contact information were provided to the patient.     After a detailed discussion, the patient elected to proceed with robotic-assisted laparoscopic radical nephrectomy. The indications, alternatives, benefits, and risks of surgery were discussed with the patient. Risks were discussed to include pain, immediate or delayed hemorrhage requiring blood transfusion and/or additional interventions, lymphatic leak, infection, bowel injury requiring immediate and/or future open surgery and possibly temporary or permanent stool diversion, injury to blood vessels, injury to surrounding organs/structures, lymphatic leak, postoperative ileus, need for nasogastric decompression, parenteral nutrition, need for hospital readmission, incisional dehiscence, potential nerve injury from either positioning or the surgery itself, loss of muscle strength, need for prolonged rehabilitation, and risks of surgery and anesthesia in general to include myocardial infarction, cerebrovascular accident, need for prolonged ventilation, pulmonary embolism, deep vein thrombosis, and even death. There is a risk of the patient requiring subsequent procedures to manage complications associated with this surgery. There is also a risk that the cancer may return and despite aggressive treatment, may be incurable leading to death. In addition, risks inherent to this particular procedure were emphasized, including pneumothorax, injury to the liver, spleen, injury to the pancreas, local or distant cancer recurrence or progression, operating on a benign tumor, and loss of kidney with resultant renal failure (acute or chronic). There is a risk of converting a laparoscopic procedure to an open procedure  due to bleeding, failure to progress, or other unforeseen issues or complications.  If conversion is through a flank approach, there is the possibility of a flank bulge associated with loss or diminished muscular tone and there is a risk of incisional hernia. The patient acknowledged and communicated understanding of these risks and wishes to proceed with surgery.     Written surgical consent obtained today.  OR scheduler notified to reserve a time and date for robotic-assisted laparoscopic partial nephrectomy.    Trinda Pascal, MD  Assistant Professor, Surgeon   Division of Urologic Oncology  Department of Urology   [facility]

## 2023-11-06 ENCOUNTER — Other Ambulatory Visit: Payer: Self-pay

## 2023-11-06 DIAGNOSIS — I2510 Atherosclerotic heart disease of native coronary artery without angina pectoris: Secondary | ICD-10-CM

## 2023-11-06 DIAGNOSIS — N2889 Other specified disorders of kidney and ureter: Secondary | ICD-10-CM

## 2023-11-06 DIAGNOSIS — J9811 Atelectasis: Secondary | ICD-10-CM

## 2023-11-13 NOTE — Addendum Note (Signed)
Addended byCordelia Poche, Sakeenah Valcarcel on: 11/13/2023 02:32 PM     Modules accepted: Level of Service

## 2023-11-26 ENCOUNTER — Other Ambulatory Visit: Payer: Self-pay

## 2023-11-26 ENCOUNTER — Other Ambulatory Visit: Payer: Self-pay | Admitting: Pharmacy Technician

## 2023-11-26 ENCOUNTER — Encounter (HOSPITAL_BASED_OUTPATIENT_CLINIC_OR_DEPARTMENT_OTHER): Payer: Self-pay | Admitting: Urology

## 2023-11-26 NOTE — Nursing Note (Signed)
 I'm sorry I just saw this message. The fax for [employer] is [phone]. Thank you for your assistance.

## 2023-11-26 NOTE — Progress Notes (Signed)
 Specialty Pharmacy Refill Coordination Note  Travis Wheeler is a 58 y.o. male contacted today regarding refills of specialty medication(s) Bictegravir-Emtricitab-Tenofov [name])   Patient requested Delivery   Delivery date: 12/02/23   Verified address: [address]  [place]   Medication will be filled on 12/01/23.

## 2023-12-01 ENCOUNTER — Other Ambulatory Visit: Payer: Self-pay

## 2023-12-02 ENCOUNTER — Inpatient Hospital Stay (HOSPITAL_COMMUNITY)
Admission: RE | Admit: 2023-12-02 | Discharge: 2023-12-02 | Disposition: A | Discharge status: Home / Self Care | Payer: Medicare (Managed Care) | Source: Ambulatory Visit

## 2023-12-02 ENCOUNTER — Encounter (HOSPITAL_COMMUNITY): Payer: Self-pay | Admitting: Urology

## 2023-12-07 ENCOUNTER — Other Ambulatory Visit: Payer: Self-pay

## 2023-12-07 DIAGNOSIS — Z79899 Other long term (current) drug therapy: Secondary | ICD-10-CM

## 2023-12-07 DIAGNOSIS — B20 Human immunodeficiency virus [HIV] disease: Secondary | ICD-10-CM

## 2023-12-07 DIAGNOSIS — Z113 Encounter for screening for infections with a predominantly sexual mode of transmission: Secondary | ICD-10-CM

## 2023-12-08 ENCOUNTER — Ambulatory Visit (HOSPITAL_BASED_OUTPATIENT_CLINIC_OR_DEPARTMENT_OTHER): Payer: Self-pay | Admitting: Urology

## 2023-12-08 NOTE — Telephone Encounter (Signed)
 Message from Cecil Cranker sent at 12/07/2023  3:41 PM EDT    Dr. Cordelia Poche        Patient is due to have surgery in April, having consistant pain in kidney area, No fever, urine out put is good, urine color is good. Request a return call for advise.       Call History    Contact Date/Time Type Contact Phone/Fax By   12/07/2023 03:38 PM EDT Phone (Incoming) Jeanluc, Wegman (Self) [phone] Rexene Edison) Creta Levin      Message  Received: Today  Isaias Sakai, APRN,AGPCNP-BC  Vella Redhead, RN  If he is in that much pain.... ER  There is no evidence on his Feb 2025 images that this is kidney related          Previous Messages       ----- Message -----  From: Vella Redhead, RN  Sent: 12/07/2023   4:07 PM EDT  To: Isaias Sakai, APRN,AGPCNP-BC    He denies any hematuria, FCNV. He said he's finding it difficult to sleep, it feels like he's being punched repeatedly in the back. Has tried ibuprofen and aspirin with no relief.  Voiding fine, drink 80oz + of water a day.      I attempted to reach the patient to assess his pain today - was unable to make contact at this time. A voicemail was left with a call back number to discuss. I was advised by Sammuel Bailiff, APRN that if the patient is interested he could have a CT Renal Calc scan completed.   I will await a call back.    Vella Redhead, RN

## 2023-12-08 NOTE — Telephone Encounter (Signed)
 Message from Cecil Cranker sent at 12/07/2023  3:41 PM EDT    Dr. Cordelia Poche        Patient is due to have surgery in April, having consistant pain in kidney area, No fever, urine out put is good, urine color is good. Request a return call for advise.       Call History    Contact Date/Time Type Contact Phone/Fax By   12/07/2023 03:38 PM EDT Phone (Incoming) Simeon, Vera (Self) [phone] Rexene Edison) Creta Levin     I was able to make contact with the patient He denies any hematuria, FCNV. He said he's finding it difficult to sleep, it feels like he's being punched repeatedly in the back. Has tried ibuprofen and aspirin with no relief.   Voiding fine, drink 80oz + of water a day.   Discussed with patient that if he finds that he is unable to get any relief from the pain it may be beneficial to present to the ED for evaluation. I also discussed with the patient signs and symptoms to be aware of that would need him to also present to the ED. I will discuss with the provider to see if there is any additional imaging that could completed for the patient.  Patient verbalized understanding and offered no additional questions at this time.    Vella Redhead, RN

## 2023-12-09 ENCOUNTER — Other Ambulatory Visit (HOSPITAL_COMMUNITY)
Admission: RE | Admit: 2023-12-09 | Discharge: 2023-12-09 | Disposition: A | Discharge status: Home / Self Care | Payer: Self-pay | Source: Ambulatory Visit | Attending: Internal Medicine | Admitting: Internal Medicine

## 2023-12-09 ENCOUNTER — Other Ambulatory Visit: Payer: Self-pay

## 2023-12-09 DIAGNOSIS — B20 Human immunodeficiency virus [HIV] disease: Secondary | ICD-10-CM | POA: Insufficient documentation

## 2023-12-09 DIAGNOSIS — Z113 Encounter for screening for infections with a predominantly sexual mode of transmission: Secondary | ICD-10-CM

## 2023-12-09 DIAGNOSIS — Z79899 Other long term (current) drug therapy: Secondary | ICD-10-CM

## 2023-12-10 LAB — T-HELPER CELL (CD4) - (RCID CLINIC ONLY)
CD4 % Helper T Cell: 24 % — ABNORMAL LOW (ref 33–65)
CD4 T Cell Abs: 390 /uL — ABNORMAL LOW (ref 400–1790)

## 2023-12-11 LAB — COMPLETE METABOLIC PANEL WITH GFR
AG Ratio: 1.5 (calc) (ref 1.0–2.5)
ALT: 29 U/L (ref 9–46)
AST: 23 U/L (ref 10–35)
Albumin: 4.6 g/dL (ref 3.6–5.1)
Alkaline phosphatase (APISO): 105 U/L (ref 35–144)
BUN: 9 mg/dL (ref 7–25)
CO2: 28 mmol/L (ref 20–32)
Calcium: 9.9 mg/dL (ref 8.6–10.3)
Chloride: 104 mmol/L (ref 98–110)
Creat: 1.03 mg/dL (ref 0.70–1.30)
Globulin: 3 g/dL (ref 1.9–3.7)
Glucose, Bld: 80 mg/dL (ref 65–99)
Potassium: 3.8 mmol/L (ref 3.5–5.3)
Sodium: 140 mmol/L (ref 135–146)
Total Bilirubin: 0.5 mg/dL (ref 0.2–1.2)
Total Protein: 7.6 g/dL (ref 6.1–8.1)
eGFR: 85 mL/min/{1.73_m2} (ref >=60)

## 2023-12-11 LAB — CBC WITH DIFFERENTIAL/PLATELET
Absolute Lymphocytes: 1647 {cells}/uL (ref 850–3900)
Absolute Monocytes: 333 {cells}/uL (ref 200–950)
Basophils Absolute: 41 {cells}/uL (ref 0–200)
Basophils Relative: 0.9 %
Eosinophils Absolute: 41 {cells}/uL (ref 15–500)
Eosinophils Relative: 0.9 %
HCT: 41.5 % (ref 38.5–50.0)
Hemoglobin: 14.2 g/dL (ref 13.2–17.1)
MCH: 31.3 pg (ref 27.0–33.0)
MCHC: 34.2 g/dL (ref 32.0–36.0)
MCV: 91.4 fL (ref 80.0–100.0)
MPV: 11 fL (ref 7.5–12.5)
Monocytes Relative: 7.4 %
Neutro Abs: 2439 {cells}/uL (ref 1500–7800)
Neutrophils Relative %: 54.2 %
Platelets: 269 Thousand/uL (ref 140–400)
RBC: 4.54 Million/uL (ref 4.20–5.80)
RDW: 12.8 % (ref 11.0–15.0)
Total Lymphocyte: 36.6 %
WBC: 4.5 Thousand/uL (ref 3.8–10.8)

## 2023-12-11 LAB — LIPID PANEL
Cholesterol: 199 mg/dL (ref <200)
HDL: 51 mg/dL (ref >=40)
LDL Cholesterol (Calc): 111 mg/dL — ABNORMAL HIGH
Non-HDL Cholesterol (Calc): 148 mg/dL — ABNORMAL HIGH (ref <130)
Total CHOL/HDL Ratio: 3.9 (calc) (ref <5.0)
Triglycerides: 258 mg/dL — ABNORMAL HIGH (ref <150)

## 2023-12-11 LAB — T PALLIDUM AB: T Pallidum Abs: POSITIVE — AB

## 2023-12-11 LAB — RPR TITER: RPR Titer: 1:2 {titer} — ABNORMAL HIGH

## 2023-12-11 LAB — URINE CYTOLOGY ANCILLARY ONLY
Chlamydia: NEGATIVE
Comment: NEGATIVE
Comment: NORMAL
Neisseria Gonorrhea: NEGATIVE

## 2023-12-11 LAB — SYPHILIS: RPR W/REFLEX TO RPR TITER AND TREPONEMAL ANTIBODIES, TRADITIONAL SCREENING AND DIAGNOSIS ALGORITHM: RPR Ser Ql: REACTIVE — AB

## 2023-12-11 LAB — HIV-1 RNA QUANT-NO REFLEX-BLD
HIV 1 RNA Quant: NOT DETECTED {copies}/mL
HIV-1 RNA Quant, Log: NOT DETECTED {Log_copies}/mL

## 2023-12-15 ENCOUNTER — Ambulatory Visit: Payer: Medicare (Managed Care) | Attending: NURSE PRACTITIONER | Admitting: NURSE PRACTITIONER

## 2023-12-15 ENCOUNTER — Other Ambulatory Visit (HOSPITAL_BASED_OUTPATIENT_CLINIC_OR_DEPARTMENT_OTHER): Payer: Self-pay | Admitting: Family

## 2023-12-15 ENCOUNTER — Encounter (HOSPITAL_COMMUNITY): Payer: Self-pay

## 2023-12-15 ENCOUNTER — Encounter (HOSPITAL_BASED_OUTPATIENT_CLINIC_OR_DEPARTMENT_OTHER): Payer: Self-pay | Admitting: Family

## 2023-12-15 DIAGNOSIS — I10 Essential (primary) hypertension: Secondary | ICD-10-CM

## 2023-12-15 DIAGNOSIS — G629 Polyneuropathy, unspecified: Secondary | ICD-10-CM

## 2023-12-15 DIAGNOSIS — F1290 Cannabis use, unspecified, uncomplicated: Secondary | ICD-10-CM

## 2023-12-15 DIAGNOSIS — N200 Calculus of kidney: Secondary | ICD-10-CM

## 2023-12-15 DIAGNOSIS — N2889 Other specified disorders of kidney and ureter: Secondary | ICD-10-CM

## 2023-12-15 DIAGNOSIS — G4730 Sleep apnea, unspecified: Secondary | ICD-10-CM

## 2023-12-15 DIAGNOSIS — H8109 Meniere's disease, unspecified ear: Secondary | ICD-10-CM

## 2023-12-15 DIAGNOSIS — Z01818 Encounter for other preprocedural examination: Secondary | ICD-10-CM | POA: Insufficient documentation

## 2023-12-15 DIAGNOSIS — K219 Gastro-esophageal reflux disease without esophagitis: Secondary | ICD-10-CM

## 2023-12-15 NOTE — H&P (Addendum)
 Addendum:      01/08/24: Message from Dr. Roselynn Connors, Ali John, MD  Sophie Dutch, RN; Servando Danger, FNP-C; Vernona Goods, FNP-C; Daneen Dunk, FNP-C; Hosie Macadamia, RN; 1 other  Cc: Aubrey Blas, FNP-C  Thank you for the update. This is too high for partial nephrectomy.    [place] & Nancyann Aye, lets reschedule the case until he has BP controlled by PCP (< 160/100 consistently). Let's see if there is another case we can schedule.    Thank you all,    Ceclia Cohens    12/16/2023    Patient to hold Trental for 7 days prior to surgery per Dr. Lavonna Prader. PEC RN to reach out with updated instructions.     01/05/2024: Spoke with pt regarding BP log. Pt has been started on Tekurna 300 mg daily and decreased losartan to 50 mg daily. Reports BPs of 167/97 in the evening and 187/113 in the morning since the medication changes were started on Sunday 01/03/2024. PEC RN to follow up with readings on 01/07/2024.      Perioperative Evaluation Center  Department of Anesthesiology  Optimization Visit  History and Physical     Sean Fernandez, Sean Fernandez, 57 y.o. male Medical Record Number: [id number]   Date of Birth:  [birth year] Date of Service:      Information Obtained from: patient Surgeon:  Surgeon(s):  Claressa Crock, MD    Date of Surgery: 01/12/2024  Planned anesthesia: General   Estimated Case Length: 263 Min     TELEMEDICINE DOCUMENTATION:    Patient Location:  MyChart video visit from home address: [address]  [place] [address]    Patient/family aware of provider location:  yes  Patient/family consent for telemedicine:  yes  Examination observed and performed by:  Vernona Goods, FNP-C        CHIEF COMPLAINT  Pre-Operative Optimization Visit for NEPHRECTOMY RADICAL ROBOTIC: 50545 (CPT)  XI ROBOT SUPPLY CARD: S2900 due to renal mass.        RECOMMENDATION  SUMMARY: The surgery is considered urgent [weeks to short months].  The surgery is considered high intensity .  Recommend  proceeding towards surgery.    Patient has the  following outstanding needs for optimization:   None  Patient would benefit from active incentive spirometer, early ambulation following surgery and multi-modal pain management approach as appropriate.  DVT prophylaxis as appropriate.  Please see addendum for any updates after 12/15/2023.        ASSESSMENT & PLAN   Preoperative examination (Primary): Scheduled for right nephrectomy with Dr. Lavonna Prader on 01/12/2024 secondary to right renal mass. Found during abdominal pain work up. Follows with urology and oncology.  Labs: reviewed. Type and screen ordered.  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 Follows with urology oncology.   Plan: proceed as planned.    HTN- Per epic, ranges 150-190/70-100's. Has not check BP recently but reports checking BP at home periodically. Denies CP, SOB, HA, vision changes. Follows with PCP.   Plan- Pt to complete BP log twice daily and PEC RN to follow up in one week. BMP and EKG reviewed. Continue losartan. Preoperative medication instructions provided.      Sleep Apnea- Reports compliance with CPAP/BIPAP qnight. Denies excessive daytime fatigue.   Plan- Continue CPAP use. Recommend follow up with PCP.     GERD- stable. Controlled. Follows with PCP.   Plan- Continue Prilosec.       Menire's disease:  stable. Follows with PCP  Plan: Continue PRN meclizine and scope patch. Pt instructed to notify anesthesia on day of surgery if scope patch is applied.    Neuropathy: Pt reports history of neuropathy for which PCP ordered trental. follows with PCP. Denies history of vascular disease, MI, stroke, and stent placement. Indicates aspirin was ordered as prophylactic.   Plan: Message sent to operating service for Trental instructions.     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  Plan- Patient instructed to hold medical marijuana 24 hours prior to surgery.           MEDICATION INSTRUCTIONS  Hold Aspirin for 7 days  prior to surgery per surgical service.     Message sent to surgical service for Trental instructions. Pt will be updated with instructions.     Take the following medications the morning of surgery: meclizine,Prilosec, lyrica, detrol LA    Take the following medications the night prior to surgery: Lipitor    Hold the following medications the day of surgery: losartan    Hold NSAIDs (Motrin, Advil, Aleve, Ibuprofen, Naprosyn), vitamins, fish oil, and herbal supplements 7 days prior to the procedure.      DIET INSTRUCTIONS   Regular diet until 8 hours prior to arrival. Clear liquids until 3 hours prior to procedure/surgery. Please drink up to 20 oz of Gatorade, Powerade, or Pedialyte up to three hours before procedure/surgery (No red colored drinks). Drinks must be finished 3 hours before scheduled procedure/surgery. You are to have nothing by mouth 3 hours prior to your scheduled procedure/surgery.  No tobacco/nicotine or mints for 8 hours prior to arrival.       HPI  Sean Fernandez is a 58 y.o. male that presents with need for pre-operative optimization.        Operative Reference Anesthesia  ASA  3    METS  >4    History of difficult intubation No    Personal or family history of anesthesia problems No    OSA  Yes -Compliant with CPAP      Operative Reference Surgery  Allergy to the following: latex, metal/nickel, iodine/shellfish, acrylic, egg/propofol, all opioids No    Recent Hospitalization (within the last 3 months) No    Current Tobacco Use  No    Current Alcohol Use No    Current Drug Use  Yes- Substance - marijuana .      Assessment Tools     Past Medical History:  Past Medical History:   Diagnosis Date    CPAP (continuous positive airway pressure) dependence     Esophageal reflux     High cholesterol     HTN (hypertension)     Hyperlipidemia     Meniere disease     Sleep apnea     Wears glasses      Past Surgical History:   Procedure Laterality Date    HX MENISCECTOMY Right     URETHRA SURGERY        Family Medical History:    None             Review of Systems:  Review of Systems   Constitutional: Negative for fever (last 7 days), unplanned weight loss, exercise intolerance and fatigue.   Skin:  Negative for rash, easy bruising and open wound.   HENT: Negative for dental problems, congestion and sore throat.   Exercise Tolerance: Negative for less than 4 METS.   Cardiovascular:  Negative for chest pain and palpitations.  Respiratory:  Negative for cough, oxygen dependent and shortness of breath (at rest).    Gastrointestinal:  Positive for heartburn (controlled with medications). Negative for abdominal pain.   Genitourinary:  Positive for frequency. Negative for urgency and hematuria.   Musculoskeletal:  Positive for back pain (related to kidney stone).   Family Anesthesia History: Negative for malignant hyperthermia precautions and pseudocholinesterase deficiency.   Anesthesia: Negative for complications, difficult intubation, difficult IV access, motion sickness and PONV.   Neurological:  Negative for dizziness (baseline. related to meniere's disease).       Exam:  There were no vitals taken for this visit.      There is no height or weight on file to calculate BMI.       Airway       Mallampati: II    TM distance: >3 FB    Neck ROM: full  Mouth Opening: good.  Facial hair  Beard        Dental           (+) missing        Comment: Upper and lower molars.       Pulmonary           Cardiovascular             Other findings                      Diagnostics included will be reviewed  and followed up as determined necessary prior to procedure         Vernona Goods, FNP-C  12/15/2023, 08:00    Perioperative Evaluation Center  [place]  [facility]  [place]    It should be emphasized that the [facility] Provider consultation is to assess the patient's medical conditions and provide recommendations to optimize the patient to decrease peri-operative complications.  Decision on whether or not to proceed to  surgery is made at the care team's discretion.

## 2023-12-15 NOTE — Patient Instructions (Addendum)
 MEDICATION INSTRUCTIONS  Hold Aspirin for 7 days prior to surgery per surgical service.     Message sent to surgical service for Trental instructions. Pt will be updated with instructions.     Take the following medications the morning of surgery: meclizine,Prilosec, lyrica, detrol LA    Take the following medications the night prior to surgery: Lipitor    Hold the following medications the day of surgery: losartan    Hold NSAIDs (Motrin, Advil, Aleve, Ibuprofen, Naprosyn), vitamins, fish oil, and herbal supplements 7 days prior to the procedure.    DIET INSTRUCTIONS   Regular diet until 8 hours prior to arrival. Clear liquids until 3 hours prior to procedure/surgery. Please drink up to 20 oz of Gatorade, Powerade, or Pedialyte up to three hours before procedure/surgery (No red colored drinks). Drinks must be finished 3 hours before scheduled procedure/surgery. You are to have nothing by mouth 3 hours prior to your scheduled procedure/surgery.  No tobacco/nicotine or mints for 8 hours prior to arrival.     Please call [phone] to speak with a nurse regarding any questions, changes in medical history, or medications.

## 2023-12-15 NOTE — Nurses Notes (Signed)
 [facility] MEDICINE    Preoperative Evaluation Center   Department of Anesthesiology      Name: Lenardo, Westwood   DOB: [birth year]    PRE-PROCEDURE/OPERATIVE INSTRUCTIONS   Preoperative Evaluation Center [facility])  [address], [place], [place] [address]    Thank you for choosing [facility] for your health care needs. [facility] is a tobacco-free campus. Please refrain from using any forms of tobacco while on the premises.     Please review upon receipt and again prior to procedure date.    If, after your PEC call or visit, you experience any of the changes below or develop any of the listed symptoms, Call the Preoperative Evaluation Center [facility]) at [phone].   Change in your best contact phone number.  Changes in your medications, especially starting a new medication.  Changes in your medical history, including an Emergency room visit, a hospital admission, or you receive a new diagnosis.    Develop any of these symptoms:  fever, cough, sore throat, shortness of breath, chills, muscle pain, new loss of taste or smell, vomiting or diarrhea, or fatigue (extreme tiredness or lack of energy).  Diagnosed with conjunctivitis (pink eye) or shingles.  Diagnosed as COVID positive.  Develop a wound, rash, open area or sores.  If you use or are prescribed an antibiotic.     Also contact your PCP to discuss your symptom(s) and the potential need for treatment/ appointments/etc.     PROCEDURE:    Procedure(s):  NEPHRECTOMY RADICAL ROBOTIC  XI ROBOT SUPPLY CARD    DATE of SURGERY:   January 12, 2024    ARRIVAL LOCATION:   1st Floor Registration     Arrival and diet instruction times will be given the business day prior to your procedure between 2 pm - 5 pm. If you should not hear from anyone by 5 pm the business day prior to your procedure, please call [phone] (Option 4).    Patients and visitors may park for free in the lots in front of the hospital. If you are need assistance from [place] parking lot, we have transportation  available to you.  Call security at [phone] to arrange pick up from the parking lot.    DIET INSTRUCTIONS:    STOP regular diet 8 hours before the arrival time of the surgery/procedure.   STOP clear liquids, including the 20 oz/ounce ELECTROLYTE drink, 3 hours before scheduled surgery time  then NPO (NPO means NO FOOD or DRINK).    ACCEPTABLE CLEAR LIQUIDS: Water, fruit juices (white grape or apple), pedialyte, gatorade, contrast dye (limited to non-particulate forms, such as gastrografin), coffee & tea without fat/milk/creamer, and clear broth without fat or protein.     ELECTROLYTE DRINK:  Gatorade, Powerade or Pedialyte, any flavor but exclude colors red/blue/purple.  Drink MUST be completed before clear liquid stop time.      LIQUIDS NOT ACCEPTED: Orange juice, any product colored red/blue/purple, coffee or tea with fat/milk/creamer, carbonated beverages or any alcoholic beverages.        Your diet instructions are specific for your safety. When not followed, particles left in your stomach could enter your lungs during surgery. Failure to follow the instructions could result in a delay or cancellation of your surgery.                     No tobacco (cigarettes, vaping, snuff, or chewing tobacco) or medical marijuana (all types) 8 hours prior to scheduled arrival time.  No  Alcohol, or recreational drugs, including marijuana, 24 hours prior to scheduled arrival time.  Chewing gum is permitted but do not swallow the gum as your surgery may be cancelled if swallowed.    MEDICATION INSTRUCTIONS:    Meds to take day of procedure with a sip of water: meclizine,Prilosec, lyrica, detrol LA       Meds to stop: Hold Aspirin for 7 days prior to surgery per surgical service. Message sent to surgical service for Trental instructions. Pt will be updated with instructions. Take the following medications the night prior to surgery: Lipitor.   Hold the following medications the day of surgery: losartan.    Avoid taking:    All NSAIDS (Ibuprofen/Motrin/Advil, Aleve/Naprosyn/Anaprox, Diclofenac/Voltaren, Meloxicam/Mobic, Ketorolac/Toradol). Vitamin E, Multivitamins, Herbals, Fish Oil, Supplements, & other over the counter meds for 7 days prior to your procedure, unless instructed otherwise. Tylenol is safe to take up until and including the morning of surgery.    PREP:   Before surgery you can play an important role in your own health. Because skin is not sterile, we need to be sure that your skin is as free from germs as possible before surgery. You can help reduce the number of germs on your skin by carefully washing before surgery. To help make the process more effective, you will need to take 2 showers (one the evening before and one the morning of) using a special cleansing solution, Chlorhexidine Gluconate 4% (CHG). No shaving for 2 days prior to surgery to prevent any break in the skin.    Read and follow the steps carefully:     Wash from your chin down to your toes, front and back of your body. Exclude eyes, ears, mouth, genitalia & rectal regions.     On the evening before surgery   Wash and rinse your hair using normal shampoo, rinsing well to remove all possible shampoo residues.   Gently wash your body, from your neck down, avoiding genitalia & rectal regions, with the CHG-saturated cloth for 2 minutes.  Rinse well to remove solution from your body.  Pat yourself dry with a clean soft towel.   Dress in clean clothes following your shower.   Once you have showered, do not use regular soap, apply deodorant, lotions, moisturizers, makeup, body spray, perfume, cologne, or aftershave unless instructed differently by your Surgeon or their Clinic.    On the morning of surgery   Repeat the antiseptic shower process listed above using the CHG solution using a new, clean washcloth. Dress in clean clothes.  Once you have showered, do not use regular soap, apply deodorant, lotions, moisturizers, makeup, body spray, perfume, cologne,  or aftershave unless instructed differently by your Surgeon or their Clinic.    TRANSPORTATION:  You must arrange for a responsible person, 18 or older, to drive you home and stay for 24 hours following discharge after the procedure. Public transportation may only be used in the event you still have the responsible person with you, drivers of the transportation are not responsible for your care.  If you have not arranged for a driver and responsible person to accompany you, your procedure will be cancelled.      If you use home oxygen, bring enough for travel to hospital and return trip home.     OTHER IMPORTANT INFORMATION:  Do not bring money, jewelry, or valuables with you.    Do not wear make-up, nail polish, lotion, powder, or aftershave. Do not wear any jewelry, watches, earrings,  rings, or body piercings.    Piercings, including silicone, will be required to be removed in Pre-op area prior to surgery.  Remove your continuous glucose monitoring system prior to arrival as staff are required to check your glucose using facility monitors. Feel free to bring your device with you as you can reapply it after surgery, upon discharge.    Transport planner for implantable devices (Deep Brain Stimulator or Nerve Stimulator for pain/mental health/sleep apnea, etc.) as they may be required to be turned off.  Minors must be accompanied by a parent or legal guardian. The responsible adult must stay with the patient before and after the procedure.  No visitors under age 79 are permitted in the preparation/recovery areas. It is strongly suggested that child-care arrangements be made for other children at home.  Wear glasses instead of contact lenses. If possible, bring a case for your glasses.  It may be possible to wear your hearing aid throughout the procedure. Discuss this with the Pre op nursing staff.  Wear comfortable shoes and clean clothes to the hospital as you will wear them home after the procedure.  Bring any  equipment - crutches, splints, etc., that you are already using at home.  Bring any legal paperwork with you (guardianship, custody, Medical Power of Attorney, Designer, industrial/product, etc) if applicable.  Before using the bathroom at the hospital, check with hospital staff for needed specimens.  You must have a valid photo ID to fill prescriptions for narcotics.  If discharged with prescriptions, we offer an in-house delivery.  Narcotics require in-person pick up with valid ID.  Other medications can be delivered to your bedside.    LODGING:    Both the [employer] and [facility] provide "homes away from home" for families of patients. If you, the patient, plan to stay, a responsible person 59 or older, is required to stay with you for 24 hours following discharge, the same as if you are going home after discharge from facility. The [facility], located across the parking lot from the hospital provides lodging and supportive services to adult patients and their families. To be eligible, guests must live 50 miles or more from the [place] area and request a referral from a hospital staff person to be placed on the waiting list. [facility] staff and volunteers can assist you with hotel reservations, usually at a discounted rate, until a room becomes available. [phone]. The [employer], located to the right of [facility] provides temporary lodging to families of children receiving hospital treatment. [phone].    For more information on local motels and a list of private homes providing rental rooms, contact Social Services at [phone].    SPIRITUAL CARE:  [facility] has chaplains available every day for your spiritual needs. Our Interfaith Prayer and Meditation Room is located at [facility] on the first floor between the [employer] and elevators. You may ask your nurse or staff member to contact the on-call chaplain  anytime.    For questions regarding instructions please contact PEC at [phone]. Leave a message if no answer.      CANCEL/PROCEDURE/SURGERY:  If you must cancel your procedure, call [phone] (Option 4) between 6 am and 5:30 pm. After 5:30 pm, call [facility] at [phone].

## 2023-12-16 ENCOUNTER — Other Ambulatory Visit: Payer: Self-pay

## 2023-12-16 ENCOUNTER — Ambulatory Visit (HOSPITAL_COMMUNITY)
Admission: RE | Admit: 2023-12-16 | Discharge: 2023-12-16 | Disposition: A | Discharge status: Home / Self Care | Payer: Medicare (Managed Care) | Source: Ambulatory Visit

## 2023-12-16 ENCOUNTER — Encounter (HOSPITAL_COMMUNITY): Payer: Self-pay

## 2023-12-16 ENCOUNTER — Encounter (HOSPITAL_BASED_OUTPATIENT_CLINIC_OR_DEPARTMENT_OTHER): Payer: Self-pay | Admitting: Family

## 2023-12-16 ENCOUNTER — Emergency Department
Admission: EM | Admit: 2023-12-16 | Discharge: 2023-12-16 | Disposition: A | Discharge status: Home / Self Care | Payer: Medicare (Managed Care)

## 2023-12-16 DIAGNOSIS — R109 Unspecified abdominal pain: Secondary | ICD-10-CM | POA: Insufficient documentation

## 2023-12-16 DIAGNOSIS — K4090 Unilateral inguinal hernia, without obstruction or gangrene, not specified as recurrent: Secondary | ICD-10-CM | POA: Insufficient documentation

## 2023-12-16 DIAGNOSIS — N200 Calculus of kidney: Secondary | ICD-10-CM | POA: Insufficient documentation

## 2023-12-16 DIAGNOSIS — N2889 Other specified disorders of kidney and ureter: Secondary | ICD-10-CM | POA: Insufficient documentation

## 2023-12-16 DIAGNOSIS — K5730 Diverticulosis of large intestine without perforation or abscess without bleeding: Secondary | ICD-10-CM | POA: Insufficient documentation

## 2023-12-16 DIAGNOSIS — N400 Enlarged prostate without lower urinary tract symptoms: Secondary | ICD-10-CM | POA: Insufficient documentation

## 2023-12-16 LAB — URINALYSIS, MACROSCOPIC
BILIRUBIN: NEGATIVE mg/dL
BLOOD: NEGATIVE mg/dL
GLUCOSE: NEGATIVE mg/dL
KETONES: NEGATIVE mg/dL
LEUKOCYTES: NEGATIVE WBCs/uL
NITRITE: NEGATIVE
PH: 7.5 (ref <8.0)
PROTEIN: NEGATIVE mg/dL
SPECIFIC GRAVITY: 1.02 (ref <1.022)
UROBILINOGEN: <2 mg/dL (ref <=2.0)

## 2023-12-16 LAB — CBC WITH DIFF
BASOPHIL #: <0.1 10*3/uL (ref <=0.20)
BASOPHIL %: 0.5 %
EOSINOPHIL #: 0.16 10*3/uL (ref <=0.50)
EOSINOPHIL %: 2.6 %
HCT: 46.3 % (ref 38.9–52.0)
HGB: 15.5 g/dL (ref 13.4–17.5)
IMMATURE GRANULOCYTE #: <0.1 10*3/uL (ref <0.10)
IMMATURE GRANULOCYTE %: 0.2 % (ref 0.0–1.0)
LYMPHOCYTE #: 1.9 10*3/uL (ref 1.00–4.80)
LYMPHOCYTE %: 31.4 %
MCH: 29.6 pg (ref 26.0–32.0)
MCHC: 33.5 g/dL (ref 31.0–35.5)
MCV: 88.4 fL (ref 78.0–100.0)
MONOCYTE #: 0.5 10*3/uL (ref 0.20–1.10)
MONOCYTE %: 8.3 %
MPV: 9.1 fL (ref 8.7–12.5)
NEUTROPHIL #: 3.45 10*3/uL (ref 1.50–7.70)
NEUTROPHIL %: 57 %
PLATELETS: 215 10*3/uL (ref 150–400)
RBC: 5.24 10*6/uL (ref 4.50–6.10)
RDW-CV: 13.9 % (ref 11.5–15.5)
WBC: 6.1 10*3/uL (ref 3.7–11.0)

## 2023-12-16 LAB — COMPREHENSIVE METABOLIC PANEL, NON-FASTING
ALBUMIN: 4.1 g/dL (ref 3.5–5.0)
ALKALINE PHOSPHATASE: 73 U/L (ref 45–115)
ALT (SGPT): 27 U/L (ref <43)
ANION GAP: 4 mmol/L (ref 4–13)
AST (SGOT): 24 U/L (ref 11–34)
BILIRUBIN TOTAL: 1.4 mg/dL — ABNORMAL HIGH (ref 0.3–1.3)
BUN/CREA RATIO: 13 (ref 6–22)
BUN: 11 mg/dL (ref 8–25)
CALCIUM: 9.2 mg/dL (ref 8.6–10.2)
CHLORIDE: 105 mmol/L (ref 96–111)
CO2 TOTAL: 29 mmol/L (ref 22–30)
CREATININE: 0.85 mg/dL (ref 0.75–1.35)
ESTIMATED GFR - MALE: >90 mL/min/BSA (ref >=60)
GLUCOSE: 109 mg/dL (ref 65–125)
POTASSIUM: 4.2 mmol/L (ref 3.5–5.1)
PROTEIN TOTAL: 7.5 g/dL (ref 6.4–8.3)
SODIUM: 138 mmol/L (ref 136–145)

## 2023-12-16 MED ORDER — KETOROLAC 30 MG/ML (1 ML) INJECTION SOLUTION
15.0000 mg | INTRAMUSCULAR | Status: AC
Start: 2023-12-16 — End: 2023-12-16
  Administered 2023-12-16: 30 mg via INTRAVENOUS
  Filled 2023-12-16: qty 1

## 2023-12-16 MED ORDER — IOPAMIDOL 370 MG IODINE/ML (76 %) INTRAVENOUS SOLUTION
135.0000 mL | INTRAVENOUS | Status: AC
Start: 2023-12-16 — End: 2023-12-16
  Administered 2023-12-16: 135 mL via INTRAVENOUS

## 2023-12-16 MED ORDER — INDOMETHACIN 50 MG CAPSULE
50.0000 mg | ORAL_CAPSULE | Freq: Three times a day (TID) | ORAL | 0 refills | Status: AC
Start: 2023-12-16 — End: 2023-12-21

## 2023-12-16 NOTE — ED Provider Notes (Signed)
 Sean Oz, MD, [facility]  [facility] - Emergency Department  ED Attending Note        Chief Complaint   Patient presents with    Flank Pain     Pt here for right flank pain for several weeks, states he has a mass on right kidney and also stones. States he is scheduled to have his kidney removed next month in [place]. Pt states he is taking tylenol and motrin with no relief. Reports increase in urine frequency and incontinence episodes. Denies bloody urine.            HISTORY OF PRESENT ILLNESS     Sean Fernandez, date of birth [birth year], is a 58 y.o.male who presents to the Emergency Department via POV and unaccompanied, here for evaluation of right flank pain that has been intermittent since this past weekend.  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  He did reach out to his oncologist due to the right flank pain starting over the weekend.  They informed him that if the pain got worse that he should come to the ED. they also scheduled him a CT of the kidney, but apparently this was scheduled in [place] the day after his nephrectomy as scheduled.  Nonetheless, he does note increased urine output.  In fact, he had an episode of incontinence over the last few days.  States that the pain is waxing and waning, sharp in character, isolated to the right flank.  Does not radiate.  Similar symptoms in the past with passage of a kidney stone.  He also reports that he was told he had a 2 mm renal pelvic stone on the right on previous scans.  He has had no gross hematuria, fevers, chills, nausea, vomiting.  Has been using aspirin as well as his CBD/THC for pain with only limited change.  Takes no anticoagulation.  No particular exacerbating factors.                                                    PATIENT HISTORY     Past Medical History:  Past Medical History:   Diagnosis Date    CPAP (continuous positive airway pressure)  dependence     Esophageal reflux     High cholesterol     HTN (hypertension)     Hyperlipidemia     Meniere disease     Sleep apnea     Wears glasses        Past Surgical History:  Past Surgical History:   Procedure Laterality Date    Hx meniscectomy Right     Urethra surgery         Family History:  Family Medical History:    None           Social History:  Social History     Tobacco Use    Smoking status: Never   Substance Use Topics    Alcohol use: Yes     Comment: occ    Drug use: No       Medications:  Current Outpatient Medications   Medication Sig    aspirin 325 mg Oral Tablet Take 1 Tablet (325 mg total) by mouth Daily    atomoxetine (STRATTERA) 40 mg Oral Capsule Take 1 Capsule (  40 mg total) by mouth Once a day (Patient not taking: Reported on 12/02/2023)    atorvastatin (LIPITOR) 20 mg Oral Tablet Take 1 Tablet (20 mg total) by mouth Every evening    cloNIDine (CATAPRES-TTS) 0.1 mg/24 hr Transdermal Patch Weekly Place 1 Patch (0.1 mg total) on the skin Every 7 days (Patient not taking: Reported on 12/02/2023)    indomethacin (INDOCIN) 50 mg Oral Capsule Take 1 Capsule (50 mg total) by mouth Three times a day for 5 days    losartan (COZAAR) 100 mg Oral Tablet Take 1 Tablet (100 mg total) by mouth Daily    meclizine (ANTIVERT) 25 mg Oral Tablet Take 1 Tablet (25 mg total) by mouth Twice per day as needed    omeprazole (PRILOSEC) 40 mg Oral Capsule, Delayed Release(E.C.) Take 1 Capsule (40 mg total) by mouth Daily    pentoxifylline (TRENTAL) 400 mg Oral Tablet Sustained Release Take 1 Tablet (400 mg total) by mouth Three times a day    pregabalin (LYRICA) 75 mg Oral Capsule Take 1 Capsule (75 mg total) by mouth Three times a day    scopolamine (TRANSDERM SCOP) 1 mg over 3 days Transdermal patch 3 day Place 1 Patch on the skin Every 72 hours    tolterodine (DETROL LA) 4 mg Oral Capsule, Sust. Release 24 hr Take 1 Capsule (4 mg total) by mouth Daily    vitamin D3-folic acid 250 mcg (10,000 unit)-1 mg Oral Tablet  Take 1 Tablet by mouth Once a day       Allergies:  Allergies   Allergen Reactions    Lisinopril      Coughing, per pt         Review of Systems   Constitutional:  Negative for chills, fever, malaise/fatigue and weight loss.   HENT:  Negative for congestion and sore throat.    Eyes:  Negative for double vision and discharge.   Respiratory:  Negative for hemoptysis and sputum production.    Cardiovascular:  Negative for chest pain, palpitations and leg swelling.   Gastrointestinal:  Negative for constipation, diarrhea, nausea and vomiting.   Genitourinary:  Positive for flank pain and frequency. Negative for dysuria and hematuria.   Musculoskeletal:  Negative for back pain, joint pain and myalgias.   Neurological:  Negative for dizziness, seizures and loss of consciousness.                                                      PHYSICAL EXAM     Vitals:  ED Triage Vitals [12/16/23 1242]   BP (Non-Invasive) (!) 206/121   Heart Rate 84   Respiratory Rate 20   Temperature 37.1 C (98.8 F)   SpO2 97 %   Weight 134 kg (295 lb)   Height 1.753 m (5\' 9" )        Physical Exam  Vitals and nursing note reviewed.   Constitutional:       General: He is not in acute distress.     Appearance: Normal appearance. He is obese. He is not ill-appearing, toxic-appearing or diaphoretic.      Comments: Nontoxic appearance, well hydrated.  Ambulates with a cane.  Does not appear acutely ill.  Pleasant and cooperative.  No respiratory distress.   HENT:      Head: Normocephalic and atraumatic.  Nose: No congestion or rhinorrhea.      Mouth/Throat:      Mouth: Mucous membranes are moist.   Eyes:      General: No scleral icterus.     Conjunctiva/sclera: Conjunctivae normal.   Cardiovascular:      Rate and Rhythm: Normal rate and regular rhythm.   Pulmonary:      Effort: Pulmonary effort is normal.      Breath sounds: Normal breath sounds. No wheezing, rhonchi or rales.   Abdominal:      General: Abdomen is flat. Bowel sounds are normal.  There is no distension.      Tenderness: There is no abdominal tenderness. There is right CVA tenderness. There is no left CVA tenderness, guarding or rebound.      Hernia: No hernia is present.   Musculoskeletal:         General: No deformity. Normal range of motion.      Cervical back: Normal range of motion and neck supple. No rigidity.      Right lower leg: No edema.      Left lower leg: No edema.   Skin:     General: Skin is warm.      Coloration: Skin is not pale.      Findings: No erythema or rash.   Neurological:      General: No focal deficit present.      Mental Status: He is alert and oriented to person, place, and time.   Psychiatric:         Mood and Affect: Mood normal.         Behavior: Behavior normal.         Thought Content: Thought content normal.         Judgment: Judgment normal.                                                    DIAGNOSTIC STUDIES     Labs:    Results for orders placed or performed during the hospital encounter of 12/16/23   COMPREHENSIVE METABOLIC PANEL, NON-FASTING   Result Value Ref Range    SODIUM 138 136 - 145 mmol/L    POTASSIUM 4.2 3.5 - 5.1 mmol/L    CHLORIDE 105 96 - 111 mmol/L    CO2 TOTAL 29 22 - 30 mmol/L    ANION GAP 4 4 - 13 mmol/L    BUN 11 8 - 25 mg/dL    CREATININE 2.13 0.86 - 1.35 mg/dL    BUN/CREA RATIO 13 6 - 22    ALBUMIN 4.1 3.5 - 5.0 g/dL     CALCIUM 9.2 8.6 - 57.8 mg/dL    GLUCOSE 469 65 - 629 mg/dL    ALKALINE PHOSPHATASE 73 45 - 115 U/L    ALT (SGPT) 27 <43 U/L    AST (SGOT)  24 11 - 34 U/L    BILIRUBIN TOTAL 1.4 (H) 0.3 - 1.3 mg/dL    PROTEIN TOTAL 7.5 6.4 - 8.3 g/dL    ESTIMATED GFR - MALE >90 >=60 mL/min/BSA   CBC WITH DIFF   Result Value Ref Range    WBC 6.1 3.7 - 11.0 x10^3/uL    RBC 5.24 4.50 - 6.10 x10^6/uL    HGB 15.5 13.4 - 17.5 g/dL    HCT 52.8 41.3 - 24.4 %  MCV 88.4 78.0 - 100.0 fL    MCH 29.6 26.0 - 32.0 pg    MCHC 33.5 31.0 - 35.5 g/dL    RDW-CV 95.2 84.1 - 32.4 %    PLATELETS 215 150 - 400 x10^3/uL    MPV 9.1 8.7 - 12.5 fL     NEUTROPHIL % 57.0 %    LYMPHOCYTE % 31.4 %    MONOCYTE % 8.3 %    EOSINOPHIL % 2.6 %    BASOPHIL % 0.5 %    NEUTROPHIL # 3.45 1.50 - 7.70 x10^3/uL    LYMPHOCYTE # 1.90 1.00 - 4.80 x10^3/uL    MONOCYTE # 0.50 0.20 - 1.10 x10^3/uL    EOSINOPHIL # 0.16 <=0.50 x10^3/uL    BASOPHIL # <0.10 <=0.20 x10^3/uL    IMMATURE GRANULOCYTE % 0.2 0.0 - 1.0 %    IMMATURE GRANULOCYTE # <0.10 <0.10 x10^3/uL   URINALYSIS, MACROSCOPIC   Result Value Ref Range    COLOR Light Yellow Colorless, Dark Yellow, Light Yellow, Straw, Yellow    APPEARANCE Clear Clear    PH 7.5 <8.0    LEUKOCYTES Negative Negative WBCs/uL    NITRITE Negative Negative    PROTEIN Negative Negative, 10  mg/dL    GLUCOSE Negative Negative mg/dL    KETONES Negative Negative, 100  mg/dL    UROBILINOGEN < 2.0 <=4.0 mg/dL    BILIRUBIN Negative Negative mg/dL    BLOOD Negative Negative mg/dL    SPECIFIC GRAVITY 1.027 <1.022     Labs reviewed and interpreted by me.                                         MEDICAL DECISION MAKING     Medications Ordered/Administered in the ED   ketorolac (TORADOL) 30 mg/mL injection (30 mg Intravenous Given 12/16/23 1332)       ED Course as of 12/16/23 1529   Wed Dec 16, 2023   1526 PROCEDURE DESCRIPTION: CT ABDOMEN PELVIS W IV CONTRAST     CLINICAL INDICATION: Known renal mass with scheduled nephrectomy for 4/15, here due to increasing flank pain     COMPARISON: CT abdomen pelvis November 04, 2023.        FINDINGS: The observed lung bases are unremarkable. The liver, spleen, adrenals, gallbladder, pancreas, and left kidney are unremarkable with exception of nonobstructing 3 mm calculus midpole left kidney as well as a exophytic 1.5 cm simple cyst anterior midpole right kidney.     Again noted is a partially exophytic solid 4.7 x 3.6 x 5.0 cm solid right renal mass within the midpole overall similar in size to previous exam. There is no perinephric soft tissue stranding or fluid collection.     There is no abdominal aortic aneurysm. There is no  lymphadenopathy. There is no free air or free fluid. There is no bowel obstruction. There is diverticulosis of the large bowel without pericolonic inflammatory changes. The appendix is normal. Bladder is mildly distended and normal contour. There is a small right inguinal hernia without bowel. The prostate measures 5 x 5.8 cm. There are mild-to-moderate degenerative changes of lumbar spine. There are mild degenerative changes bilateral hips.     IMPRESSION:  .  1. Stable 4.7 x 3.6 x 5.0 cm solid right renal mass. Findings compatible with malignancy until proven otherwise.  2. Enlarged prostate. Correlate with PSA and directed exam.  3. Diverticulosis  large bowel without pericolonic inflammatory change.  4. Nondilated gallbladder.  5. No abdominal aortic aneurysm.  6. Small right inguinal hernia without bowel                 The CT exam was performed using one or more of the following dose reduction techniques: Automated exposure control, adjustment of the mA and/or kV according to the patient's size, or use of iterative reconstruction technique.        Radiologist location ID: [id number]  CT imaging as provided by the radiologist and independently reviewed by me with no findings to suggest acute change.   1526 Fortunately, no note was made of hydroureter on the involved side.        Medical Decision Making  Amount and/or Complexity of Data Reviewed  Labs: ordered.  Radiology: ordered.    Risk  Prescription drug management.      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  We will be evaluated with CT imaging; will include IV contrast due to the presence of his renal cell mass.  We will also check UA for infection.  CBC and chemistries included.  He will receive IV Toradol for his current  pain.           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                                               DISPOSITION PLAN     Discharged    Prescriptions:     New Prescriptions    INDOMETHACIN (INDOCIN) 50 MG ORAL CAPSULE    Take 1 Capsule (50 mg total) by mouth Three times a day for 5 days        Follow-Up:     Deboraha Sprang, MD  [address]  [place] [address]  [phone]    Schedule an appointment as soon as possible for a visit in 3 days  For re-evaluation    Patient re-evaluated prior to discharge.  We reviewed all his results and plan for outpatient follow-up.  He states understanding and is agreeable with the plan.  Condition at Disposition: Improved

## 2023-12-16 NOTE — Discharge Instructions (Addendum)
 Fortunately, on evaluation today we did not find any serious issues.  The renal mass appears to be stable.  There is no blockage seen on the right side.  You do have a 3 mm stone on the left not causing any issues at the current time.  Please use the medication as prescribed for any residual pain and see your PCP within the next 3-5 days to address any ongoing discomfort.

## 2023-12-16 NOTE — Nurses Notes (Signed)
 [facility] MEDICINE    Preoperative Evaluation Center   Department of Anesthesiology      Name: Sean Fernandez, Sean Fernandez   DOB: [birth year]    PRE-PROCEDURE/OPERATIVE INSTRUCTIONS   Preoperative Evaluation Center [facility])  [address], [place], [place] [address]    Thank you for choosing [facility] for your health care needs. [facility] is a tobacco-free campus. Please refrain from using any forms of tobacco while on the premises.     Please review upon receipt and again prior to procedure date.    If, after your PEC call or visit, you experience any of the changes below or develop any of the listed symptoms, Call the Preoperative Evaluation Center [facility]) at [phone].   Change in your best contact phone number.  Changes in your medications, especially starting a new medication.  Changes in your medical history, including an Emergency room visit, a hospital admission, or you receive a new diagnosis.    Develop any of these symptoms:  fever, cough, sore throat, shortness of breath, chills, muscle pain, new loss of taste or smell, vomiting or diarrhea, or fatigue (extreme tiredness or lack of energy).  Diagnosed with conjunctivitis (pink eye) or shingles.  Diagnosed as COVID positive.  Develop a wound, rash, open area or sores.  If you use or are prescribed an antibiotic.     Also contact your PCP to discuss your symptom(s) and the potential need for treatment/ appointments/etc.     PROCEDURE:    Procedure(s):  NEPHRECTOMY RADICAL ROBOTIC  XI ROBOT SUPPLY CARD    DATE of SURGERY:   January 12, 2024    ARRIVAL LOCATION:   1st Floor Registration     Arrival and diet instruction times will be given the business day prior to your procedure between 2 pm - 5 pm. If you should not hear from anyone by 5 pm the business day prior to your procedure, please call [phone] (Option 4).    Patients and visitors may park for free in the lots in front of the hospital. If you are need assistance from [employer] parking lot, we have transportation  available to you.  Call security at [phone] to arrange pick up from the parking lot.    DIET INSTRUCTIONS:    STOP regular diet 8 hours before the arrival time of the surgery/procedure.   STOP clear liquids, including the 20 oz/ounce ELECTROLYTE drink, 3 hours before scheduled surgery time  then NPO (NPO means NO FOOD or DRINK).    ACCEPTABLE CLEAR LIQUIDS: Water, fruit juices (white grape or apple), pedialyte, gatorade, contrast dye (limited to non-particulate forms, such as gastrografin), coffee & tea without fat/milk/creamer, and clear broth without fat or protein.     ELECTROLYTE DRINK:  Gatorade, Powerade or Pedialyte, any flavor but exclude colors red/blue/purple.  Drink MUST be completed before clear liquid stop time.      LIQUIDS NOT ACCEPTED: Orange juice, any product colored red/blue/purple, coffee or tea with fat/milk/creamer, carbonated beverages or any alcoholic beverages.        Your diet instructions are specific for your safety. When not followed, particles left in your stomach could enter your lungs during surgery. Failure to follow the instructions could result in a delay or cancellation of your surgery.                     No tobacco (cigarettes, vaping, snuff, or chewing tobacco) or medical marijuana (all types) 8 hours prior to scheduled arrival time.  No  Alcohol, or recreational drugs, including marijuana, 24 hours prior to scheduled arrival time.  Chewing gum is permitted but do not swallow the gum as your surgery may be cancelled if swallowed.    MEDICATION INSTRUCTIONS:    Meds to take day of procedure with a sip of water: meclizine, Prilosec, Lyrica, Detrol LA.  Take the following medication the night prior to the surgery: Lipitor.       Meds to stop: Hold Aspirin for 7 days prior to surgery per surgical service.  Hold Trental for 7 days prior to procerdure as per surgical service.  Hold the following medications the day of surgery: Losartan.    Avoid taking:   All NSAIDS  (Ibuprofen/Motrin/Advil, Aleve/Naprosyn/Anaprox, Diclofenac/Voltaren, Meloxicam/Mobic, Ketorolac/Toradol). Vitamin E, Multivitamins, Herbals, Fish Oil, Supplements, & other over the counter meds for 7 days prior to your procedure, unless instructed otherwise. Tylenol is safe to take up until and including the morning of surgery.    PREP:   Before surgery you can play an important role in your own health. Because skin is not sterile, we need to be sure that your skin is as free from germs as possible before surgery. You can help reduce the number of germs on your skin by carefully washing before surgery. To help make the process more effective, you will need to take 2 showers (one the evening before and one the morning of) using a special cleansing solution, Chlorhexidine Gluconate 4% (CHG). No shaving for 2 days prior to surgery to prevent any break in the skin.    Read and follow the steps carefully:     Wash from your chin down to your toes, front and back of your body. Exclude eyes, ears, mouth, genitalia & rectal regions.     On the evening before surgery   Wash and rinse your hair using normal shampoo, rinsing well to remove all possible shampoo residues.   Gently wash your body, from your neck down, avoiding genitalia & rectal regions, with the CHG-saturated cloth for 2 minutes.  Rinse well to remove solution from your body.  Pat yourself dry with a clean soft towel.   Dress in clean clothes following your shower.   Once you have showered, do not use regular soap, apply deodorant, lotions, moisturizers, makeup, body spray, perfume, cologne, or aftershave unless instructed differently by your Surgeon or their Clinic.    On the morning of surgery   Repeat the antiseptic shower process listed above using the CHG solution using a new, clean washcloth. Dress in clean clothes.  Once you have showered, do not use regular soap, apply deodorant, lotions, moisturizers, makeup, body spray, perfume, cologne, or aftershave  unless instructed differently by your Surgeon or their Clinic.    TRANSPORTATION:  You must arrange for a responsible person, 18 or older, to drive you home and stay for 24 hours following discharge after the procedure. Public transportation may only be used in the event you still have the responsible person with you, drivers of the transportation are not responsible for your care.  If you have not arranged for a driver and responsible person to accompany you, your procedure will be cancelled.      If you use home oxygen, bring enough for travel to hospital and return trip home.     OTHER IMPORTANT INFORMATION:  Do not bring money, jewelry, or valuables with you.    Do not wear make-up, nail polish, lotion, powder, or aftershave. Do not wear any jewelry, watches,  earrings, rings, or body piercings.    Piercings, including silicone, will be required to be removed in Pre-op area prior to surgery.  Remove your continuous glucose monitoring system prior to arrival as staff are required to check your glucose using facility monitors. Feel free to bring your device with you as you can reapply it after surgery, upon discharge.    Transport planner for implantable devices (Deep Brain Stimulator or Nerve Stimulator for pain/mental health/sleep apnea, etc.) as they may be required to be turned off.  Minors must be accompanied by a parent or legal guardian. The responsible adult must stay with the patient before and after the procedure.  No visitors under age 22 are permitted in the preparation/recovery areas. It is strongly suggested that child-care arrangements be made for other children at home.  Wear glasses instead of contact lenses. If possible, bring a case for your glasses.  It may be possible to wear your hearing aid throughout the procedure. Discuss this with the Pre op nursing staff.  Wear comfortable shoes and clean clothes to the hospital as you will wear them home after the procedure.  Bring any equipment -  crutches, splints, etc., that you are already using at home.  Bring any legal paperwork with you (guardianship, custody, Medical Power of Attorney, Designer, industrial/product, etc) if applicable.  Before using the bathroom at the hospital, check with hospital staff for needed specimens.  You must have a valid photo ID to fill prescriptions for narcotics.  If discharged with prescriptions, we offer an in-house delivery.  Narcotics require in-person pick up with valid ID.  Other medications can be delivered to your bedside.    LODGING:    Both the [employer] and [facility] provide "homes away from home" for families of patients. If you, the patient, plan to stay, a responsible person 60 or older, is required to stay with you for 24 hours following discharge, the same as if you are going home after discharge from facility. The [facility], located across the parking lot from the hospital provides lodging and supportive services to adult patients and their families. To be eligible, guests must live 50 miles or more from the [place] area and request a referral from a hospital staff person to be placed on the waiting list. [facility] staff and volunteers can assist you with hotel reservations, usually at a discounted rate, until a room becomes available. [phone]. The [employer], located to the right of [facility] provides temporary lodging to families of children receiving hospital treatment. [phone].    For more information on local motels and a list of private homes providing rental rooms, contact Social Services at [phone].    SPIRITUAL CARE:  [facility] has chaplains available every day for your spiritual needs. Our Interfaith Prayer and Meditation Room is located at [facility] on the first floor between the [employer] and elevators. You may ask your nurse or staff member to contact the on-call chaplain anytime.    For  questions regarding instructions please contact PEC at [phone]. Leave a message if no answer.      CANCEL/PROCEDURE/SURGERY:  If you must cancel your procedure, call [phone] (Option 4) between 6 am and 5:30 pm. After 5:30 pm, call [facility] at [phone].

## 2023-12-16 NOTE — ED Nurses Note (Signed)
 Iv removed, patient walked from the room Patient discharged home with family.  AVS reviewed with patient/care giver.  A written copy of the AVS and discharge instructions was given to the patient/care giver.  Questions sufficiently answered as needed.  Patient/care giver encouraged to follow up with PCP as indicated.  In the event of an emergency, patient/care giver instructed to call 911 or go to the nearest emergency room.  Declined vitals

## 2023-12-22 ENCOUNTER — Other Ambulatory Visit (HOSPITAL_COMMUNITY): Payer: Self-pay

## 2023-12-23 ENCOUNTER — Encounter: Payer: Self-pay | Admitting: Internal Medicine

## 2023-12-23 ENCOUNTER — Ambulatory Visit: Payer: PRIVATE HEALTH INSURANCE | Admitting: Internal Medicine

## 2023-12-23 ENCOUNTER — Other Ambulatory Visit: Payer: Self-pay

## 2023-12-23 ENCOUNTER — Other Ambulatory Visit (HOSPITAL_COMMUNITY)
Admission: RE | Admit: 2023-12-23 | Discharge: 2023-12-23 | Disposition: A | Discharge status: Home / Self Care | Payer: PRIVATE HEALTH INSURANCE | Source: Ambulatory Visit | Attending: Internal Medicine | Admitting: Internal Medicine

## 2023-12-23 ENCOUNTER — Other Ambulatory Visit (HOSPITAL_COMMUNITY): Payer: Self-pay

## 2023-12-23 VITALS — BP 143/84 | HR 76 | Ht 71.0 in | Wt 195.0 lb

## 2023-12-23 DIAGNOSIS — B20 Human immunodeficiency virus [HIV] disease: Secondary | ICD-10-CM | POA: Diagnosis not present

## 2023-12-23 DIAGNOSIS — Z113 Encounter for screening for infections with a predominantly sexual mode of transmission: Secondary | ICD-10-CM | POA: Diagnosis present

## 2023-12-23 DIAGNOSIS — Z5181 Encounter for therapeutic drug level monitoring: Secondary | ICD-10-CM

## 2023-12-23 DIAGNOSIS — Z23 Encounter for immunization: Secondary | ICD-10-CM | POA: Diagnosis not present

## 2023-12-23 DIAGNOSIS — Z79899 Other long term (current) drug therapy: Secondary | ICD-10-CM

## 2023-12-23 DIAGNOSIS — Z21 Asymptomatic human immunodeficiency virus [HIV] infection status: Secondary | ICD-10-CM

## 2023-12-23 MED ORDER — BIKTARVY 50-200-25 MG PO TABS
1.0000 | ORAL_TABLET | Freq: Every day | ORAL | 11 refills | Status: DC
  Filled 2023-12-23: qty 30, 30d supply, fill #0
  Filled 2024-01-26: qty 30, 30d supply, fill #1
  Filled 2024-02-19: qty 30, 30d supply, fill #2
  Filled 2024-03-23: qty 30, 30d supply, fill #3
  Filled 2024-04-13 – 2024-04-15 (×2): qty 30, 30d supply, fill #4
  Filled 2024-05-11 – 2024-05-13 (×3): qty 30, 30d supply, fill #5
  Filled 2024-06-15: qty 30, 30d supply, fill #6
  Filled 2024-07-12: qty 30, 30d supply, fill #7
  Filled 2024-08-04 – 2024-08-10 (×2): qty 30, 30d supply, fill #8
  Filled 2024-09-02: qty 30, 30d supply, fill #9
  Filled 2024-10-05: qty 30, 30d supply, fill #10

## 2023-12-23 MED ORDER — PRAVASTATIN SODIUM 10 MG PO TABS: 10.0000 mg | ORAL_TABLET | Freq: Every day | ORAL | 11 refills | Status: DC

## 2023-12-23 MED ORDER — DOXYCYCLINE HYCLATE 100 MG PO TABS: 200.0000 mg | ORAL_TABLET | Freq: Once | ORAL | 5 refills | Status: DC | PRN

## 2023-12-23 NOTE — Addendum Note (Signed)
 Addended by: Tressa Busman T on: 12/23/2023 04:25 PM   Modules accepted: Orders

## 2023-12-23 NOTE — Assessment & Plan Note (Signed)
 He is doing well on Biktarvy and labs reviewed with him.  No concerns and refills provided.  He can continue with follow-up in about 11 months.

## 2023-12-23 NOTE — Assessment & Plan Note (Signed)
 Lipid panel checked and reviewed with him.

## 2023-12-23 NOTE — Progress Notes (Signed)
 Specialty Pharmacy Refill Coordination Note  Travis Wheeler is a 58 y.o. male contacted today regarding refills of specialty medication(s) Bictegravir-Emtricitab-Tenofov [name])   Patient requested Delivery   Delivery date: 12/25/23   Verified address: [address] [facility] [address]   Medication will be filled on 12/24/23.

## 2023-12-23 NOTE — Assessment & Plan Note (Addendum)
 He was screened and no concerns. Also discussed Doxy pep and a prescription for doxycycline given today for any concern/unprotected sexual activity. Will check his anal Pap today

## 2023-12-23 NOTE — Assessment & Plan Note (Signed)
 His creatinine and LFTs remain normal.

## 2023-12-23 NOTE — Progress Notes (Signed)
   Subjective:    Patient ID: Travis Wheeler, male    DOB: 1966-03-23, 58 y.o.   MRN: [id number]  HPI Travis Wheeler is here for follow-up of HIV. He has continued on Biktarvy and denies any missed doses.  He has no issues with the Biktarvy and no problems getting or taking the medication.  He has no complaints today.   Review of Systems  Constitutional:  Negative for fatigue.  Gastrointestinal:  Negative for diarrhea and nausea.  Skin:  Negative for rash.       Objective:   Physical Exam Eyes:     General: No scleral icterus. Pulmonary:     Effort: Pulmonary effort is normal.  Neurological:     Mental Status: He is alert.   SH: no tobacco        Assessment & Plan:

## 2023-12-24 ENCOUNTER — Other Ambulatory Visit: Payer: Self-pay

## 2023-12-24 ENCOUNTER — Other Ambulatory Visit (HOSPITAL_COMMUNITY): Payer: Self-pay

## 2023-12-29 LAB — CYTOLOGY - PAP
Comment: NEGATIVE
Diagnosis: NEGATIVE
High risk HPV: NEGATIVE

## 2024-01-04 NOTE — Nurses Notes (Addendum)
 Obtained BP log from patient.  BP readings are elevated.  He has contacted his PCP.  Started on:    Tekturna 300 mg take 1 tablet once daily  Losartan decreased from 100 mg to 50 mg once daily    Blood pressure log scanned into media and given to NP for review.  Will contact pt on Friday 01/08/24 for BP log review.    Margarita Sermons [facility]

## 2024-01-08 ENCOUNTER — Encounter (HOSPITAL_BASED_OUTPATIENT_CLINIC_OR_DEPARTMENT_OTHER): Payer: Self-pay | Admitting: Urology

## 2024-01-08 NOTE — Nursing Note (Signed)
 Sean Fernandez   I let him know we are cancelling surgery for now & he is asking for you to call him about his FMLA & short term disability   Returned call to patient. He ask if Uro/Onc will write his FMLA to be coverted by us  while he is attempting to get his BP controlled. Informed him we will not write for that his PCP is who needs to write for that time frame if deemed he is unable to work during BP control. We will revise his FMLA paperwork regarding surgery once new OR date is determined and set. The patient verbalized understanding and offers no additional questions at this time. Hosie Macadamia, RN

## 2024-01-08 NOTE — Nurses Notes (Signed)
 Obtained BP readings from patient.  Message sent to service regarding BP log.  Patient is going to reach out to PCP regarding BP readings and his medications.  He will also contact Urology provider regarding his BP.    Kristen Fromm [facility]

## 2024-01-12 ENCOUNTER — Other Ambulatory Visit: Payer: Self-pay

## 2024-01-13 ENCOUNTER — Ambulatory Visit (HOSPITAL_COMMUNITY): Payer: Self-pay

## 2024-01-15 ENCOUNTER — Encounter (HOSPITAL_BASED_OUTPATIENT_CLINIC_OR_DEPARTMENT_OTHER): Payer: Self-pay | Admitting: Urology

## 2024-01-19 NOTE — Nurses Notes (Signed)
 Called PCP office to obtain recent visit note.  LVMM with return telephone number and fax number.    Sean Fernandez [facility]

## 2024-01-20 ENCOUNTER — Encounter (HOSPITAL_COMMUNITY): Payer: Self-pay

## 2024-01-20 ENCOUNTER — Encounter (HOSPITAL_BASED_OUTPATIENT_CLINIC_OR_DEPARTMENT_OTHER): Payer: Self-pay | Admitting: Urology

## 2024-01-20 MED ORDER — BUPIVACAINE (PF) 0.25 % (2.5 MG/ML) INJECTION SOLUTION
INTRAMUSCULAR | Status: AC
Start: 2024-01-20 — End: 2024-01-20
  Filled 2024-01-20: qty 60

## 2024-01-20 NOTE — Nursing Note (Signed)
 After reviewing BP logs sent by patient with Dr. Lavonna Prader called placed to patient. Informed patient the plan is to place him on the OR Schedule for mid June with the understanding that he has to maintain consistent BP <160/<100. He is to continue sending BP logs via My Chart for the doctor to review. He will also start checking BP in each arm and record them for review. Reminded patient that he must maintain consistent BP < then mentioned value. Also that the morning of surgery his BP must be <160/<100 or surgery will be cancelled. Patient did mention his concern of Renin type BP meds being used, informed him he needs to discuss with PCP or the doctor attempting to control his BP. The patient verbalized understanding and offers no additional questions at this time.  Hosie Macadamia, RN

## 2024-01-26 ENCOUNTER — Other Ambulatory Visit: Payer: Self-pay

## 2024-01-26 NOTE — Progress Notes (Signed)
 Specialty Pharmacy Refill Coordination Note  Travis Wheeler is a 58 y.o. male contacted today regarding refills of specialty medication(s) Bictegravir-Emtricitab-Tenofov (Biktarvy )   Patient requested Delivery   Delivery date: 01/29/24   Verified address: [address] [place] [address]   Medication will be filled on 05.01.25.

## 2024-01-28 ENCOUNTER — Other Ambulatory Visit: Payer: Self-pay

## 2024-02-02 ENCOUNTER — Encounter (HOSPITAL_COMMUNITY): Payer: Self-pay

## 2024-02-03 ENCOUNTER — Encounter (HOSPITAL_COMMUNITY): Payer: Self-pay

## 2024-02-03 ENCOUNTER — Inpatient Hospital Stay (HOSPITAL_COMMUNITY)
Admission: RE | Admit: 2024-02-03 | Discharge: 2024-02-03 | Disposition: A | Discharge status: Home / Self Care | Payer: Medicare (Managed Care) | Source: Ambulatory Visit

## 2024-02-03 HISTORY — DX: Personal history of other diseases of the nervous system and sense organs: Z86.69

## 2024-02-03 NOTE — Nurses Notes (Signed)
 [facility] MEDICINE    Preoperative Evaluation Center   Department of Anesthesiology      Name: Dearon, Dema   DOB: Aug 13, 1966    PRE-PROCEDURE/OPERATIVE INSTRUCTIONS   Preoperative Evaluation Center [facility])  [address] [place], [place] [address]    Thank you for choosing [facility] for your health care needs. [facility] is a tobacco-free campus. Please refrain from using any forms of tobacco while on the premises.     Please review upon receipt and again prior to procedure date.    If, after your PEC call or visit, you experience any of the changes below or develop any of the listed symptoms, Call the Preoperative Evaluation Center [facility]) at [phone].   Change in your best contact phone number.  Changes in your medications, especially starting a new medication.  Changes in your medical history, including an Emergency room visit, a hospital admission, or you receive a new diagnosis.    Develop any of these symptoms:  fever, cough, sore throat, shortness of breath, chills, muscle pain, new loss of taste or smell, vomiting or diarrhea, or fatigue (extreme tiredness or lack of energy).  Diagnosed with conjunctivitis (pink eye) or shingles.  Diagnosed as COVID positive.  Develop a wound, rash, open area or sores.  If you use or are prescribed an antibiotic.     Also contact your PCP to discuss your symptom(s) and the potential need for treatment/ appointments/etc.     PROCEDURE:    Procedure(s):  NEPHRECTOMY RADICAL ROBOTIC  XI ROBOT SUPPLY CARD    DATE of SURGERY:   March 15, 2024    ARRIVAL LOCATION:   1st Floor Registration     Arrival and diet instruction times will be given the business day prior to your procedure between 2 pm - 5 pm. If you should not hear from anyone by 5 pm the business day prior to your procedure, please call [phone] (Option 4).    Patients and visitors may park for free in the lots in front of the hospital. If you are need assistance from [employer] [facility] parking lot, we have transportation  available to you.  Call security at [phone] to arrange pick up from the parking lot.    DIET INSTRUCTIONS:   STOP regular diet 8 hours before the arrival time of the surgery/procedure.   STOP clear liquids 2 hours before the arrival time of surgery/procedure  then NPO (NPO means NO FOOD or DRINK).    ACCEPTABLE CLEAR LIQUIDS: Water, fruit juices (white grape or apple), pedialyte, gatorade, contrast dye (limited to non-particulate forms, such as gastrografin), coffee & tea without fat/milk/creamer, and clear broth without fat or protein.     LIQUIDS NOT ACCEPTED: Orange juice, any product colored red/blue/purple, infant formula, breast milk, coffee or tea with fat/milk/creamer, carbonated beverages or any alcoholic beverages.       Your diet instructions are specific for your safety. When not followed, particles left in your stomach could enter your lungs during surgery. Failure to follow the instructions could result in a delay or cancellation of your surgery.            Special Circumstances Diet Instruction: Drink 20oz electrolyte beverage up to 3 hours before scheduled surgery time. The electrolyte beverage must be completed by this time. (Gatorade, Powerade, or Pedialyte, any flavor but exclude colors red/blue/purple).    No tobacco (cigarettes, vaping, snuff, or chewing tobacco) or medical marijuana (all types) 8 hours prior to scheduled arrival time.  No Alcohol,  or recreational drugs, including marijuana, 24 hours prior to scheduled arrival time.  Chewing gum is permitted but do not swallow the gum as your surgery may be cancelled if swallowed.    MEDICATION INSTRUCTIONS:    Meds to take day of procedure with a sip of water: VERAPAMIL, TEKTURNA, MECLIZINE, CLONIDINE, DETROL, AND ANTIVERT       Meds to stop: DO NOT TAKE STRATERRA OR ADDERALL MORNING OF SURGERY. STOP ASPIRIN AND TRENTAL 7 DAYS PRIOR TO PROCEDURE.    Avoid taking:   All NSAIDS (Ibuprofen/Motrin/Advil, Aleve/Naprosyn/Anaprox,  Diclofenac/Voltaren, Meloxicam/Mobic, Ketorolac /Toradol ). Vitamin E, Multivitamins, Herbals, Fish Oil, Supplements, & other over the counter meds for 7 days prior to your procedure, unless instructed otherwise. Tylenol is safe to take up until and including the morning of surgery.    PREP:   Before surgery you can play an important role in your own health. Because skin is not sterile, we need to be sure that your skin is as free from germs as possible before surgery. You can help reduce the number of germs on your skin by carefully washing before surgery. To help make the process more effective, you will need to take 2 showers (one the evening before and one the morning of) using an antibacterial soap. Washing your hair (head) with normal Shampoo is acceptable. Dress in clean clothes after each shower. No shaving for 2 days prior to surgery to prevent any break in the skin. Once you have showered, do not apply deodorant, lotions, moisturizers, makeup, body spray, perfume, cologne, or aftershave unless instructed differently by your Surgeon or their Clinic.    TRANSPORTATION:  You must arrange for a responsible person, 18 or older, to drive you home and stay for 24 hours following discharge after the procedure. Public transportation may only be used in the event you still have the responsible person with you, drivers of the transportation are not responsible for your care.  If you have not arranged for a driver and responsible person to accompany you, your procedure will be cancelled.      If you use home oxygen, bring enough for travel to hospital and return trip home.     OTHER IMPORTANT INFORMATION:  Do not bring money, jewelry, or valuables with you.    Do not wear make-up, nail polish, lotion, powder, or aftershave. Do not wear any jewelry, watches, earrings, rings, or body piercings.    Piercings, including silicone, will be required to be removed in Pre-op area prior to surgery.  Remove your continuous glucose  monitoring system prior to arrival as staff are required to check your glucose using facility monitors. Feel free to bring your device with you as you can reapply it after surgery, upon discharge.    Bring Programmer for implantable devices (Deep Brain Stimulator or Nerve Stimulator for pain/mental health/sleep apnea, etc.) as they may be required to be turned off.  Minors must be accompanied by a parent or legal guardian. The responsible adult must stay with the patient before and after the procedure.  No visitors under age 2 are permitted in the preparation/recovery areas. It is strongly suggested that child-care arrangements be made for other children at home.  If child stays overnight, one parent or other adult family member is encouraged to stay during your child's hospitalization. Each room has a sleeper chair, couch, and shower, you are permitted to use. Siblings are not allowed to stay overnight. Rules are subject to change due to COVID guidelines.  Children may  bring a special toy, doll, or blanket.  Wear glasses instead of contact lenses. If possible, bring a case for your glasses.  It may be possible to wear your hearing aid throughout the procedure. Discuss this with the Pre op nursing staff.  Wear comfortable shoes and clean clothes to the hospital as you will wear them home after the procedure.  Bring any equipment - crutches, splints, etc., that you are already using at home.  Bring any legal paperwork with you (guardianship, custody, Medical Power of Attorney, Designer, industrial/product, etc) if applicable.  Before using the bathroom at the hospital, check with hospital staff for needed specimens.  You must have a valid photo ID to fill prescriptions for narcotics.  If discharged with prescriptions, we offer an in-house delivery.  Narcotics require in-person pick up with valid ID.  Other medications can be delivered to your bedside.    LODGING:    Both the [employer] and [facility]  provide "homes away from home" for families of patients. If you, the patient, plan to stay, a responsible person 42 or older, is required to stay with you for 24 hours following discharge, the same as if you are going home after discharge from facility. The [facility], located across the parking lot from the hospital provides lodging and supportive services to adult patients and their families. To be eligible, guests must live 50 miles or more from the [place] area and request a referral from a hospital staff person to be placed on the waiting list. [facility] staff and volunteers can assist you with hotel reservations, usually at a discounted rate, until a room becomes available. [phone]. The [employer], located to the right of [facility] provides temporary lodging to families of children receiving hospital treatment. [phone].    For more information on local motels and a list of private homes providing rental rooms, contact Social Services at [phone].    SPIRITUAL CARE:  [facility] has chaplains available every day for your spiritual needs. Our Interfaith Prayer and Meditation Room is located at [facility] on the first floor between the [employer] and elevators. You may ask your nurse or staff member to contact the on-call chaplain anytime.    For questions regarding instructions please contact PEC at [phone]. Leave a message if no answer.      CANCEL/PROCEDURE/SURGERY:  If you must cancel your procedure, call [phone] (Option 4) between 6 am and 5:30 pm. After 5:30 pm, call [facility] Healthline at [phone].

## 2024-02-09 ENCOUNTER — Encounter (HOSPITAL_BASED_OUTPATIENT_CLINIC_OR_DEPARTMENT_OTHER): Payer: Self-pay

## 2024-02-16 ENCOUNTER — Other Ambulatory Visit: Payer: Self-pay

## 2024-02-18 ENCOUNTER — Other Ambulatory Visit: Payer: Self-pay

## 2024-02-19 ENCOUNTER — Other Ambulatory Visit: Payer: Self-pay

## 2024-02-19 NOTE — Progress Notes (Signed)
 Specialty Pharmacy Refill Coordination Note  Travis Wheeler is a 58 y.o. male contacted today regarding refills of specialty medication(s) Bictegravir-Emtricitab-Tenofov (Biktarvy )   Patient requested Delivery   Delivery date: 02/24/24   Verified address: [address] [place] [address]   Medication will be filled on 05.27.25.

## 2024-02-26 NOTE — Progress Notes (Signed)
 The 10-year ASCVD risk score (Arnett DK, et al., 2019) is: 9.1%   Values used to calculate the score:     Age: 58 years     Sex: Male     Is Non-Hispanic African American: Yes     Diabetic: No     Tobacco smoker: No     Systolic Blood Pressure: 143 mmHg     Is BP treated: No     HDL Cholesterol: 51 mg/dL     Total Cholesterol: 199 mg/dL  Currently prescribed pravastatin  10 mg.  Sumaiya Arruda, BSN, RN

## 2024-03-11 ENCOUNTER — Encounter (HOSPITAL_BASED_OUTPATIENT_CLINIC_OR_DEPARTMENT_OTHER): Payer: Self-pay | Admitting: Family

## 2024-03-15 ENCOUNTER — Other Ambulatory Visit: Payer: Self-pay

## 2024-03-15 ENCOUNTER — Encounter (HOSPITAL_COMMUNITY): Payer: Self-pay | Admitting: Urology

## 2024-03-15 ENCOUNTER — Inpatient Hospital Stay
Admission: RE | Admit: 2024-03-15 | Discharge: 2024-03-16 | DRG: 657 | Disposition: A | Discharge status: Home / Self Care | Payer: Medicare (Managed Care) | Attending: Urology | Admitting: Urology

## 2024-03-15 ENCOUNTER — Inpatient Hospital Stay (HOSPITAL_COMMUNITY): Payer: Medicare (Managed Care) | Admitting: Certified Registered"

## 2024-03-15 ENCOUNTER — Encounter (HOSPITAL_COMMUNITY)
Admission: RE | Disposition: A | Discharge status: Home / Self Care | Payer: Self-pay | Source: Home / Self Care | Attending: Urology

## 2024-03-15 DIAGNOSIS — E7800 Pure hypercholesterolemia, unspecified: Secondary | ICD-10-CM | POA: Diagnosis present

## 2024-03-15 DIAGNOSIS — Z6841 Body Mass Index (BMI) 40.0 and over, adult: Secondary | ICD-10-CM

## 2024-03-15 DIAGNOSIS — K219 Gastro-esophageal reflux disease without esophagitis: Secondary | ICD-10-CM | POA: Diagnosis present

## 2024-03-15 DIAGNOSIS — C641 Malignant neoplasm of right kidney, except renal pelvis: Secondary | ICD-10-CM

## 2024-03-15 DIAGNOSIS — H9190 Unspecified hearing loss, unspecified ear: Secondary | ICD-10-CM | POA: Diagnosis present

## 2024-03-15 DIAGNOSIS — Z5331 Laparoscopic surgical procedure converted to open procedure: Secondary | ICD-10-CM

## 2024-03-15 DIAGNOSIS — H8109 Meniere's disease, unspecified ear: Secondary | ICD-10-CM | POA: Diagnosis present

## 2024-03-15 DIAGNOSIS — Z905 Acquired absence of kidney: Secondary | ICD-10-CM

## 2024-03-15 DIAGNOSIS — N2889 Other specified disorders of kidney and ureter: Principal | ICD-10-CM | POA: Diagnosis present

## 2024-03-15 DIAGNOSIS — K4090 Unilateral inguinal hernia, without obstruction or gangrene, not specified as recurrent: Secondary | ICD-10-CM | POA: Diagnosis present

## 2024-03-15 DIAGNOSIS — G4730 Sleep apnea, unspecified: Secondary | ICD-10-CM | POA: Diagnosis present

## 2024-03-15 DIAGNOSIS — F1290 Cannabis use, unspecified, uncomplicated: Secondary | ICD-10-CM | POA: Diagnosis present

## 2024-03-15 DIAGNOSIS — E785 Hyperlipidemia, unspecified: Secondary | ICD-10-CM | POA: Diagnosis present

## 2024-03-15 DIAGNOSIS — G629 Polyneuropathy, unspecified: Secondary | ICD-10-CM | POA: Diagnosis present

## 2024-03-15 HISTORY — DX: Sleep apnea, unspecified: G47.30

## 2024-03-15 HISTORY — DX: Gastro-esophageal reflux disease without esophagitis: K21.9

## 2024-03-15 HISTORY — DX: Hyperlipidemia, unspecified: E78.5

## 2024-03-15 LAB — BASIC METABOLIC PANEL
ANION GAP: 9 mmol/L (ref 4–13)
BUN/CREA RATIO: 14 (ref 6–22)
BUN: 12 mg/dL (ref 8–25)
CALCIUM: 8.4 mg/dL — ABNORMAL LOW (ref 8.6–10.2)
CHLORIDE: 103 mmol/L (ref 96–111)
CO2 TOTAL: 23 mmol/L (ref 22–30)
CREATININE: 0.86 mg/dL (ref 0.75–1.35)
ESTIMATED GFR - MALE: >90 mL/min/BSA (ref >=60)
GLUCOSE: 161 mg/dL — ABNORMAL HIGH (ref 65–125)
POTASSIUM: 4.2 mmol/L (ref 3.5–5.1)
SODIUM: 135 mmol/L — ABNORMAL LOW (ref 136–145)

## 2024-03-15 LAB — TYPE AND SCREEN
ABO/RH(D): O POS
ANTIBODY SCREEN: NEGATIVE
UNITS ORDERED: 2

## 2024-03-15 LAB — CBC
HCT: 42.7 % (ref 38.9–52.0)
HGB: 14.8 g/dL (ref 13.4–17.5)
MCH: 31.3 pg (ref 26.0–32.0)
MCHC: 34.7 g/dL (ref 31.0–35.5)
MCV: 90.3 fL (ref 78.0–100.0)
MPV: 8.7 fL (ref 8.7–12.5)
PLATELETS: 186 10*3/uL (ref 150–400)
RBC: 4.73 10*6/uL (ref 4.50–6.10)
RDW-CV: 13.2 % (ref 11.5–15.5)
WBC: 8.9 10*3/uL (ref 3.7–11.0)

## 2024-03-15 LAB — ABO & RH: ABO/RH(D): O POS

## 2024-03-15 SURGERY — ROBOTIC NEPHRECTOMY RADICAL
Anesthesia: General | Site: Kidney | Laterality: Right | Wound class: Clean Contaminated Wounds-The respiratory, GI, Genital, or urinary

## 2024-03-15 MED ORDER — SODIUM CHLORIDE 0.9 % (FLUSH) INJECTION SYRINGE
2.0000 mL | INJECTION | INTRAMUSCULAR | Status: DC | PRN
Start: 2024-03-15 — End: 2024-03-15

## 2024-03-15 MED ORDER — HYDROMORPHONE (PF) 0.5 MG/0.5 ML INJECTION SYRINGE
0.4000 mg | INJECTION | INTRAMUSCULAR | Status: DC | PRN
Start: 2024-03-15 — End: 2024-03-15
  Administered 2024-03-15: 0.4 mg via INTRAVENOUS
  Filled 2024-03-15: qty 0.5

## 2024-03-15 MED ORDER — SODIUM CHLORIDE 0.9 % INTRAVENOUS SOLUTION
0.0000 mg/h | INTRAVENOUS | Status: DC
Start: 2024-03-15 — End: 2024-03-15
  Filled 2024-03-15: qty 20

## 2024-03-15 MED ORDER — SODIUM CHLORIDE 0.9% FLUSH BAG - 250 ML
INTRAVENOUS | Status: DC | PRN
Start: 2024-03-15 — End: 2024-03-16

## 2024-03-15 MED ORDER — SUGAMMADEX 100 MG/ML INTRAVENOUS SOLUTION
Freq: Once | INTRAVENOUS | Status: DC | PRN
Start: 2024-03-15 — End: 2024-03-15
  Administered 2024-03-15: 400 mg via INTRAVENOUS

## 2024-03-15 MED ORDER — VERAPAMIL ER (SR) 240 MG TABLET,EXTENDED RELEASE
240.0000 mg | ORAL_TABLET | Freq: Every morning | ORAL | Status: DC
Start: 2024-03-15 — End: 2024-03-16

## 2024-03-15 MED ORDER — MIDAZOLAM 1 MG/ML INJECTION SOLUTION
INTRAMUSCULAR | Status: AC
Start: 2024-03-15 — End: 2024-03-15
  Filled 2024-03-15: qty 2

## 2024-03-15 MED ORDER — WATER FOR IRRIGATION, STERILE SOLUTION
2000.0000 mL | Status: DC | PRN
Start: 2024-03-15 — End: 2024-03-15
  Administered 2024-03-15: 2000 mL

## 2024-03-15 MED ORDER — OXYCODONE 10 MG TABLET
10.0000 mg | ORAL_TABLET | Freq: Once | ORAL | Status: DC | PRN
Start: 2024-03-15 — End: 2024-03-15

## 2024-03-15 MED ORDER — DEXTROSE 5% IN WATER (D5W) FLUSH BAG - 250 ML
INTRAVENOUS | Status: DC | PRN
Start: 2024-03-15 — End: 2024-03-15

## 2024-03-15 MED ORDER — DEXTROSE 5% IN WATER (D5W) FLUSH BAG - 250 ML
INTRAVENOUS | Status: DC | PRN
Start: 2024-03-15 — End: 2024-03-16

## 2024-03-15 MED ORDER — HYDROCODONE 5 MG-ACETAMINOPHEN 325 MG TABLET
1.0000 | ORAL_TABLET | Freq: Four times a day (QID) | ORAL | Status: DC | PRN
Start: 2024-03-15 — End: 2024-03-16
  Administered 2024-03-15: 1 via ORAL
  Filled 2024-03-15: qty 1

## 2024-03-15 MED ORDER — APREPITANT 40 MG CAPSULE
40.0000 mg | ORAL_CAPSULE | Freq: Once | ORAL | Status: AC
Start: 2024-03-15 — End: 2024-03-15
  Administered 2024-03-15: 40 mg via ORAL
  Filled 2024-03-15: qty 1

## 2024-03-15 MED ORDER — PHENYLEPHRINE 1 MG/10 ML (100 MCG/ML) IN 0.9 % SOD.CHLORIDE IV SYRINGE
INJECTION | Freq: Once | INTRAVENOUS | Status: DC | PRN
Start: 2024-03-15 — End: 2024-03-15
  Administered 2024-03-15: 100 ug via INTRAVENOUS

## 2024-03-15 MED ORDER — SUGAMMADEX 100 MG/ML INTRAVENOUS SOLUTION
INTRAVENOUS | Status: AC
Start: 2024-03-15 — End: 2024-03-15
  Filled 2024-03-15: qty 5

## 2024-03-15 MED ORDER — SODIUM CHLORIDE 0.9% FLUSH BAG - 250 ML
INTRAVENOUS | Status: DC | PRN
Start: 2024-03-15 — End: 2024-03-15

## 2024-03-15 MED ORDER — FENTANYL (PF) 50 MCG/ML INJECTION SOLUTION
Freq: Once | INTRAMUSCULAR | Status: DC | PRN
Start: 2024-03-15 — End: 2024-03-15
  Administered 2024-03-15: 50 ug via INTRAVENOUS
  Administered 2024-03-15: 100 ug via INTRAVENOUS

## 2024-03-15 MED ORDER — SODIUM CHLORIDE 0.9 % (FLUSH) INJECTION SYRINGE
2.0000 mL | INJECTION | INTRAMUSCULAR | Status: DC | PRN
Start: 2024-03-15 — End: 2024-03-16

## 2024-03-15 MED ORDER — KETOROLAC 30 MG/ML (1 ML) INJECTION SOLUTION
15.0000 mg | Freq: Four times a day (QID) | INTRAMUSCULAR | Status: DC | PRN
Start: 2024-03-15 — End: 2024-03-18
  Administered 2024-03-15 – 2024-03-16 (×3): 15 mg via INTRAVENOUS
  Filled 2024-03-15 (×3): qty 1

## 2024-03-15 MED ORDER — LIDOCAINE (PF) 20 MG/ML (2 %) INJECTION SOLUTION
INTRAMUSCULAR | Status: AC
Start: 2024-03-15 — End: 2024-03-15
  Filled 2024-03-15: qty 5

## 2024-03-15 MED ORDER — THROMBIN(HUM-PLAS)-FIBRIN-CAL 500 UNIT-80 MG/ML (10ML) TOPICAL SYRINGE
INJECTION | Freq: Once | CUTANEOUS | Status: DC | PRN
Start: 2024-03-15 — End: 2024-03-15
  Administered 2024-03-15: 10 mL via TOPICAL
  Filled 2024-03-15: qty 10

## 2024-03-15 MED ORDER — HYDROMORPHONE (PF) 0.5 MG/0.5 ML INJECTION SYRINGE
0.2000 mg | INJECTION | INTRAMUSCULAR | Status: DC | PRN
Start: 2024-03-15 — End: 2024-03-15

## 2024-03-15 MED ORDER — PROPOFOL 10 MG/ML INTRAVENOUS EMULSION
INTRAVENOUS | Status: AC
Start: 2024-03-15 — End: 2024-03-15
  Filled 2024-03-15: qty 20

## 2024-03-15 MED ORDER — ACETAMINOPHEN 1,000 MG/100 ML (10 MG/ML) INTRAVENOUS SOLUTION
INTRAVENOUS | Status: AC
Start: 2024-03-15 — End: 2024-03-15
  Filled 2024-03-15: qty 100

## 2024-03-15 MED ORDER — ACETAMINOPHEN 1,000 MG/100 ML (10 MG/ML) INTRAVENOUS SOLUTION
Freq: Once | INTRAVENOUS | Status: DC | PRN
Start: 2024-03-15 — End: 2024-03-15
  Administered 2024-03-15: 1000 mg via INTRAVENOUS

## 2024-03-15 MED ORDER — MIDAZOLAM (PF) 1 MG/ML INJECTION SOLUTION
Freq: Once | INTRAMUSCULAR | Status: DC | PRN
Start: 2024-03-15 — End: 2024-03-15
  Administered 2024-03-15: 2 mg via INTRAVENOUS

## 2024-03-15 MED ORDER — ATORVASTATIN 10 MG TABLET
20.0000 mg | ORAL_TABLET | Freq: Every evening | ORAL | Status: DC
Start: 2024-03-15 — End: 2024-03-16
  Administered 2024-03-15: 20 mg via ORAL
  Filled 2024-03-15: qty 2

## 2024-03-15 MED ORDER — ROCURONIUM 10 MG/ML INTRAVENOUS SYRINGE WRAPPER
INJECTION | INTRAVENOUS | Status: AC
Start: 2024-03-15 — End: 2024-03-15
  Filled 2024-03-15: qty 5

## 2024-03-15 MED ORDER — DEXAMETHASONE SODIUM PHOSPHATE 4 MG/ML INJECTION SOLUTION
Freq: Once | INTRAMUSCULAR | Status: DC | PRN
Start: 2024-03-15 — End: 2024-03-15
  Administered 2024-03-15: 4 mg via INTRAVENOUS

## 2024-03-15 MED ORDER — OXYCODONE 5 MG TABLET
5.0000 mg | ORAL_TABLET | Freq: Once | ORAL | Status: DC | PRN
Start: 2024-03-15 — End: 2024-03-15

## 2024-03-15 MED ORDER — SUCCINYLCHOLINE 20 MG/ML INTRAVENOUS WRAPPER
INJECTION | INTRAVENOUS | Status: AC
Start: 2024-03-15 — End: 2024-03-15
  Filled 2024-03-15: qty 10

## 2024-03-15 MED ORDER — ATOMOXETINE 40 MG CAPSULE
40.0000 mg | ORAL_CAPSULE | Freq: Every day | ORAL | Status: DC
Start: 2024-03-16 — End: 2024-03-16
  Administered 2024-03-16: 40 mg via ORAL
  Filled 2024-03-15: qty 1

## 2024-03-15 MED ORDER — DROPERIDOL 2.5 MG/ML INJECTION SOLUTION
0.6250 mg | Freq: Once | INTRAMUSCULAR | Status: DC | PRN
Start: 2024-03-15 — End: 2024-03-15
  Administered 2024-03-15: 0.625 mg via INTRAVENOUS
  Filled 2024-03-15: qty 2

## 2024-03-15 MED ORDER — ONDANSETRON HCL (PF) 4 MG/2 ML INJECTION SOLUTION
Freq: Once | INTRAMUSCULAR | Status: DC | PRN
Start: 2024-03-15 — End: 2024-03-15
  Administered 2024-03-15: 4 mg via INTRAVENOUS

## 2024-03-15 MED ORDER — CLEVIDIPINE 50 MG/100 ML INTRAVENOUS EMULSION
INTRAVENOUS | Status: DC | PRN
Start: 2024-03-15 — End: 2024-03-15
  Administered 2024-03-15: 0 mg/h via INTRAVENOUS
  Administered 2024-03-15: 7 mg/h via INTRAVENOUS
  Administered 2024-03-15: 9 mg/h via INTRAVENOUS
  Administered 2024-03-15: 3 mg/h via INTRAVENOUS
  Administered 2024-03-15: 0 mg/h via INTRAVENOUS
  Administered 2024-03-15 (×3): 5 mg/h via INTRAVENOUS
  Administered 2024-03-15 (×2): 7 mg/h via INTRAVENOUS
  Administered 2024-03-15: 0 mg/h via INTRAVENOUS
  Administered 2024-03-15: 5 mg/h via INTRAVENOUS
  Administered 2024-03-15: 0 mg/h via INTRAVENOUS
  Administered 2024-03-15: 5 mg/h via INTRAVENOUS

## 2024-03-15 MED ORDER — FENTANYL (PF) 50 MCG/ML INJECTION SOLUTION
INTRAMUSCULAR | Status: AC
Start: 2024-03-15 — End: 2024-03-15
  Filled 2024-03-15: qty 2

## 2024-03-15 MED ORDER — SODIUM CHLORIDE 0.9 % INTRAVENOUS SOLUTION
INTRAVENOUS | Status: DC | PRN
Start: 2024-03-15 — End: 2024-03-15
  Administered 2024-03-15: 0 via INTRAVENOUS

## 2024-03-15 MED ORDER — ONDANSETRON HCL (PF) 4 MG/2 ML INJECTION SOLUTION
INTRAMUSCULAR | Status: AC
Start: 2024-03-15 — End: 2024-03-15
  Filled 2024-03-15: qty 2

## 2024-03-15 MED ORDER — HALOPERIDOL LACTATE 5 MG/ML INJECTION SOLUTION
Freq: Once | INTRAMUSCULAR | Status: DC | PRN
Start: 2024-03-15 — End: 2024-03-15
  Administered 2024-03-15: 5 mg via INTRAVENOUS

## 2024-03-15 MED ORDER — DEXAMETHASONE SODIUM PHOSPHATE 4 MG/ML INJECTION SOLUTION
INTRAMUSCULAR | Status: AC
Start: 2024-03-15 — End: 2024-03-15
  Filled 2024-03-15: qty 1

## 2024-03-15 MED ORDER — LACTATED RINGERS INTRAVENOUS SOLUTION
INTRAVENOUS | Status: DC
Start: 2024-03-15 — End: 2024-03-15

## 2024-03-15 MED ORDER — PROPOFOL 10 MG/ML IV BOLUS
INJECTION | Freq: Once | INTRAVENOUS | Status: DC | PRN
Start: 2024-03-15 — End: 2024-03-15
  Administered 2024-03-15: 50 mg via INTRAVENOUS
  Administered 2024-03-15: 200 mg via INTRAVENOUS

## 2024-03-15 MED ORDER — HYDROMORPHONE (PF) 0.5 MG/0.5 ML INJECTION SYRINGE
INJECTION | Freq: Once | INTRAMUSCULAR | Status: DC | PRN
Start: 2024-03-15 — End: 2024-03-15
  Administered 2024-03-15 (×2): .5 mg via INTRAVENOUS

## 2024-03-15 MED ORDER — ROCURONIUM 10 MG/ML INTRAVENOUS SYRINGE WRAPPER
INJECTION | Freq: Once | INTRAVENOUS | Status: DC | PRN
Start: 2024-03-15 — End: 2024-03-15
  Administered 2024-03-15: 20 mg via INTRAVENOUS
  Administered 2024-03-15: 50 mg via INTRAVENOUS
  Administered 2024-03-15 (×2): 30 mg via INTRAVENOUS
  Administered 2024-03-15: 10 mg via INTRAVENOUS

## 2024-03-15 MED ORDER — SODIUM CHLORIDE 0.9 % IRRIGATION SOLUTION
1000.0000 mL | Status: DC | PRN
Start: 2024-03-15 — End: 2024-03-15
  Administered 2024-03-15: 1000 mL

## 2024-03-15 MED ORDER — SODIUM CHLORIDE 0.9 % (FLUSH) INJECTION SYRINGE
2.0000 mL | INJECTION | Freq: Three times a day (TID) | INTRAMUSCULAR | Status: DC
Start: 2024-03-15 — End: 2024-03-15

## 2024-03-15 MED ORDER — TOLTERODINE ER 2 MG CAPSULE,EXTENDED RELEASE 24 HR
4.0000 mg | ORAL_CAPSULE | Freq: Every day | ORAL | Status: DC
Start: 2024-03-16 — End: 2024-03-16
  Administered 2024-03-16: 4 mg via ORAL
  Filled 2024-03-15: qty 2

## 2024-03-15 MED ORDER — SODIUM CHLORIDE 0.9 % (FLUSH) INJECTION SYRINGE
2.0000 mL | INJECTION | Freq: Three times a day (TID) | INTRAMUSCULAR | Status: DC
Start: 2024-03-15 — End: 2024-03-16
  Administered 2024-03-15: 0 mL
  Administered 2024-03-15: 5 mL
  Administered 2024-03-16: 6 mL
  Administered 2024-03-16: 0 mL

## 2024-03-15 MED ORDER — ACETAMINOPHEN 325 MG TABLET
650.0000 mg | ORAL_TABLET | Freq: Four times a day (QID) | ORAL | Status: DC
Start: 2024-03-15 — End: 2024-03-16
  Administered 2024-03-15: 0 mg via ORAL
  Administered 2024-03-15: 650 mg via ORAL
  Administered 2024-03-15: 0 mg via ORAL
  Administered 2024-03-16 (×2): 650 mg via ORAL
  Filled 2024-03-15 (×3): qty 2

## 2024-03-15 MED ORDER — HYDROMORPHONE (PF) 0.5 MG/0.5 ML INJECTION SYRINGE
INJECTION | INTRAMUSCULAR | Status: AC
Start: 2024-03-15 — End: 2024-03-15
  Filled 2024-03-15: qty 0.5

## 2024-03-15 MED ORDER — PHENYLEPHRINE 1 MG/10 ML (100 MCG/ML) IN 0.9 % SOD.CHLORIDE IV SYRINGE
INJECTION | INTRAVENOUS | Status: AC
Start: 2024-03-15 — End: 2024-03-15
  Filled 2024-03-15: qty 10

## 2024-03-15 MED ORDER — BUPIVACAINE (PF) 0.5 % (5 MG/ML) INJECTION SOLUTION
30.0000 mL | Freq: Once | INTRAMUSCULAR | Status: DC | PRN
Start: 2024-03-15 — End: 2024-03-15
  Administered 2024-03-15: 30 mL

## 2024-03-15 MED ORDER — LIDOCAINE (PF) 100 MG/5 ML (2 %) INTRAVENOUS SYRINGE
INJECTION | Freq: Once | INTRAVENOUS | Status: DC | PRN
Start: 2024-03-15 — End: 2024-03-15
  Administered 2024-03-15: 100 mg via INTRAVENOUS

## 2024-03-15 MED ORDER — SCOPOLAMINE 1 MG OVER 3 DAYS TRANSDERMAL PATCH
1.0000 | MEDICATED_PATCH | Freq: Once | TRANSDERMAL | Status: DC
Start: 2024-03-15 — End: 2024-03-16
  Administered 2024-03-15: 1 via TRANSDERMAL
  Filled 2024-03-15: qty 1

## 2024-03-15 MED ORDER — ONDANSETRON HCL (PF) 4 MG/2 ML INJECTION SOLUTION
4.0000 mg | Freq: Once | INTRAMUSCULAR | Status: DC | PRN
Start: 2024-03-15 — End: 2024-03-15

## 2024-03-15 MED ORDER — WATER FOR INJECTION, STERILE INJECTION SOLUTION
3.0000 g | Freq: Once | INTRAMUSCULAR | Status: AC
Start: 2024-03-15 — End: 2024-03-15
  Administered 2024-03-15: 3 g via INTRAVENOUS
  Filled 2024-03-15: qty 30

## 2024-03-15 MED ORDER — SODIUM CHLORIDE 0.9 % (FLUSH) INJECTION SYRINGE
2.0000 mL | INJECTION | Freq: Three times a day (TID) | INTRAMUSCULAR | Status: DC
Start: 2024-03-15 — End: 2024-03-15
  Administered 2024-03-15: 0 mL

## 2024-03-15 MED ORDER — SUCCINYLCHOLINE 20 MG/ML INTRAVENOUS WRAPPER
INJECTION | Freq: Once | INTRAVENOUS | Status: DC | PRN
Start: 2024-03-15 — End: 2024-03-15
  Administered 2024-03-15: 200 mg via INTRAVENOUS

## 2024-03-15 MED ORDER — PREGABALIN 75 MG CAPSULE
75.0000 mg | ORAL_CAPSULE | Freq: Three times a day (TID) | ORAL | Status: DC
Start: 2024-03-15 — End: 2024-03-16
  Administered 2024-03-15 – 2024-03-16 (×3): 75 mg via ORAL
  Filled 2024-03-15 (×3): qty 1

## 2024-03-15 SURGICAL SUPPLY — 53 items
ADH SKNCLS CYNCRLT EXOFIN HVSC STRL TISS LF  DISP 1G (MED SURG SUPPLIES) ×2 IMPLANT
APPL LAPSCP 45CM VISTASEAL DL APPL FLXB (ENDOSCOPIC SUPPLIES) ×4 IMPLANT
ARMBRD POSITION 20X8X2IN DVN FOAM (MED SURG SUPPLIES) ×6 IMPLANT
BAL DSCT LAPSCP PDB SPCMKR PRPERITN DSTN STRL LF  DISP INGUINAL HERNIA REPR LRG KNDY (ENDOSCOPIC SUPPLIES) ×2 IMPLANT
BLANKET MISTRAL-AIR ADULT LWR BODY 55.9X40.2IN FRC AIR HI VOL BLWR INTUITIVE CONTROL PNL LRG LED (MED SURG SUPPLIES) ×2 IMPLANT
CLIP LRG INTERNAL WECK HEMOLOK PLMR LGT NONAB CART STRL DISP LF  PURP (WOUND CARE SUPPLY) ×6 IMPLANT
CLIP SUT LPR TY VICRYL PDS ABS STRL LF (WOUND CARE SUPPLY) ×4 IMPLANT
CLIP XL INTERNAL WECK HEMOLOK PLMR LGT NONAB CART STRL DISP LF  GLD (WOUND CARE SUPPLY) ×6 IMPLANT
CUSTOM LAPAROSCOPIC ~~LOC~~ - RUBY MEMORIAL HOSPITAL (CUSTOM TRAYS & PACK) ×2 IMPLANT
DEVICE CLSR NEOCLOSE PORT ANCH GUIDE KIT 5/12 8/15 REG STRL LF  DISP (CARDIAC) ×2 IMPLANT
DRAIN INCS 15FR JP SIL HBLS CHNL STRL LF  30MM RND DISP WHT (MED SURG SUPPLIES) ×2 IMPLANT
DRAPE 2 INCS FILM ANTIMIC 23X17IN IOBN STRL SURG (DRAPE/PACKS/SHEETS/OR TOWEL) ×2 IMPLANT
DRAPE 4FT BIG CA BCK TBL MODL 427 HVDTY OR SPEC LF  STRL DISP SURG (DRAPE/PACKS/SHEETS/OR TOWEL) ×2 IMPLANT
DRAPE ARM 21X19X10.5IN DAVINCI XI EQP 21LB (DRAPE/PACKS/SHEETS/OR TOWEL) ×8 IMPLANT
DRAPE CLMN DAVINCI XI EQP (DRAPE/PACKS/SHEETS/OR TOWEL) ×2 IMPLANT
GARMENT COMPRESS MED CALF CENTAURA NYL VASOGRAD LTWT BRTHBL SEQ FIL BLU 18- IN (MED SURG SUPPLIES) ×2 IMPLANT
HANDPC ESURG 10FT BNABM CORD 6IN STRL LF  DISP (SURGICAL CUTTING SUPPLIES) ×2 IMPLANT
HEMOSTAT ABS 8X4IN FLXB SHR WV_SRGCL STRL DISP (WOUND CARE SUPPLY) ×2 IMPLANT
IMG CLEARIFY 8X6IN WARM HUB TROCAR WIPE MRFBR SYSTEM DISP (ENDOSCOPIC SUPPLIES) ×4 IMPLANT
LAPAROSCOPIC ~~LOC~~ - RUBY MEMORIAL HOSPITAL (CUSTOM TRAYS & PACK) ×2 IMPLANT
LUB INSTR BTL PAD ELC LUBE FOAM STRL (MISCELLANEOUS PT CARE ITEMS) ×2 IMPLANT
MBO USE ITEM 130230 - DEVICE ENDOS ENDO PNUT 5MM 45CM SWAB COTTON DISP (ENDOSCOPIC SUPPLIES) ×2 IMPLANT
NEEDLE INSFL 120MM 14GA STD SRGNDL PNMPRTN SPRG LD BLUNT STY STRL LF  DISP SS PLASTIC RD (ENDOSCOPIC SUPPLIES) ×2 IMPLANT
OBTURATOR LAPSCP 8MM WECK VST BLDLS STRL LF  DISP (ROBOTIC SUPPLIES) ×4 IMPLANT
PACK SURG UNIV SIRUS SPLT II REINF TBL CVR 2 ADH SD DRP STRL 90X50IN 79X35IN LF (CUSTOM TRAYS & PACK) ×2 IMPLANT
PENCIL SMOKE MANAGEMENT EDGE BLADE ELECTRODE 10FT (MED SURG SUPPLIES) ×2 IMPLANT
PORT HAND ACCESS 120MM 2 WL CANN BLDLS OPTC TIP PLM GRIP OBTURATOR VALVELESS TROCAR AIRSEAL 12MM (ENDOSCOPIC SUPPLIES) ×2 IMPLANT
POUCH SPEC RETR 34.5CM 15MM E-CTCH POLYUR FLXB LONG CYL TUBE 29.5CM STRL LF  DISP (ENDOSCOPIC SUPPLIES) ×2 IMPLANT
PROBE LAPSCP 5MM GENRATR ELECTRODE CORD FTCNT STRL ABC DISP 28CM 10FT LF (ENDOSCOPIC SUPPLIES) ×2 IMPLANT
RELOAD STPLR 2MM 2.5MM 3MM 45MM EGIA TI MED VAS TISS ARTC KNIFE BLADE LGR ANVIL BCKT STRL LF  DISP (SUTURE AIDS) ×2 IMPLANT
RELOAD STPLR 2MM 2.5MM 3MM 60MM EGIA TI MED VAS TISS ARTC KNIFE BLADE LGR ANVIL BCKT STRL LF  DISP (SUTURE AIDS) ×2 IMPLANT
RELOAD STPLR 2MM 45MM EGIA TI THN VAS TISS ARTC KNIFE BLADE LGR ANVIL BCKT STRL LF  DISP GRY (SUTURE AIDS) ×2 IMPLANT
RESERVOIR DRAIN SIL JP BULB 100CC STRL LF  DISP (MED SURG SUPPLIES) ×2 IMPLANT
SEAL CANNULA 5-12 MM UNIVERSAL DISPOSABLE (ROBOTIC SUPPLIES) ×8 IMPLANT
SEALDIVD ESURG 18CM 13.5MM LIGASURE IMPCT 14D 180D 34MM CURVE JAW OVAL SHAFT NANO COAT OPN 36MM VLAB (SURGICAL CUTTING SUPPLIES) ×2 IMPLANT
SET .241IN 77IN NONVENT PIERCE PIN LRG BORE TUBE SIGHT CHAMBER GRVTY FLOW IRRG CSCP BLADDER STRL LF (MED SURG SUPPLIES) ×2 IMPLANT
SET TUBING AIRSEAL ACT CHRCL 3 LUM FILTER STRL LF  DISP (MED SURG SUPPLIES) ×2 IMPLANT
SOL WARMING DRAPE 44IN X 44IN RECTANGULAR FRAME (DRAPE/PACKS/SHEETS/OR TOWEL) ×2 IMPLANT
SOL WARMING DRAPES RECTANGULAR FRAME EXTENDED 60IN X 44IN (DRAPE/PACKS/SHEETS/OR TOWEL) ×2 IMPLANT
STAPLER INTERNAL 26CMX4MM TI XL UNIV X THKTIS ARTC LRG BCKT STRL LF  DISP EGIA ULTRA ENDOS 12MM (SUTURE AIDS) ×2 IMPLANT
STAPLER SKIN 4.1X6.5MM 35 W STPL CART LF  APS U DISP CLR SS PLASTIC (WOUND CARE SUPPLY) ×2 IMPLANT
SUTURE 0 GS-21 VLOC 180 15CM GRN ABS ABS 37MM (SUTURE AIDS) ×6 IMPLANT
SUTURE 0 UR-6 VICRYL+ 27IN VIOL BRD ANBCTRL COAT ABS (SUTURE/WOUND CLOSURE) ×8 IMPLANT
SUTURE 3-0 CV-23 VLOC 180 6IN GRN ABS ABS (SUTURE AIDS) ×4 IMPLANT
SUTURE 4-0 PS2 MONOCRYL + 27IN UNDYED MONOF ANBCTRL ABS (SUTURE/WOUND CLOSURE) ×4 IMPLANT
SYRINGE LL 5ML LF STRL GRAD N-PYRG DEHP-FR PVC FREE MED DISP CLR (MED SURG SUPPLIES) ×2 IMPLANT
TAPE ADH 3IN 1538-3 BX/4 ROLLS (WOUND CARE SUPPLY) ×4 IMPLANT
TIP CAUT HOT SHR DAVINCI ENDOWRIST 8MM STD CAUT MONOPOL DISP (ROBOTIC SUPPLIES) ×2 IMPLANT
TOWEL 26X16IN COTTON BLU SAF DISP SURG STRL LF (DRAPE/PACKS/SHEETS/OR TOWEL) ×8 IMPLANT
TRAY CATH SNAPSEC SIL 16FR 10ML 1 LAYER FOLEY DRAIN BAG URINE METER LF (MED SURG SUPPLIES) ×2 IMPLANT
TROCAR LAPSCP 5MM 10MM 12MM ATSUT BLUNT TIP BUIL IN CNVTR BAL OBTURATOR STRL LTX DISP (ENDOSCOPIC SUPPLIES) ×2 IMPLANT
TROCAR LAPSCP STD 100MM 5MM VERSAONE FIX CANN BLDLS DLPHN NOSE TIP STRL LF  DISP (ENDOSCOPIC SUPPLIES) ×2 IMPLANT
TROCAR LAPSCP STD 15MM VPRT+ FIX CANN BLDLS INSERTION TIP TISS DSCT FIN STRL LF  DISP (ENDOSCOPIC SUPPLIES) ×2 IMPLANT

## 2024-03-15 NOTE — Anesthesia Transfer of Care (Signed)
 ANESTHESIA TRANSFER OF CARE   Sean Fernandez is a 58 y.o. ,male, Weight: (!) 140 kg (308 lb 10.3 oz)   had Procedure(s):  NEPHRECTOMY RADICAL ROBOTIC  XI ROBOT SUPPLY CARD  performed  03/15/24   Primary Service: Claressa Crock, MD    Past Medical History:   Diagnosis Date    CPAP (continuous positive airway pressure) dependence     Esophageal reflux     H/O hearing loss     High cholesterol     HTN (hypertension)     Hyperlipidemia     Meniere disease     Sleep apnea     Wears glasses       Allergy History as of 03/15/24       LISINOPRIL         Noted Status Severity Type Reaction    09/01/23 1448 Zenobia Hila, MA 09/01/23 Active       Comments: Coughing, per pt                   I completed my transfer of care / handoff to the receiving personnel during which we discussed:  Access, Airway, All key/critical aspects of case discussed, Analgesia, Antibiotics, Expectation of post procedure, Fluids/Product, Gave opportunity for questions and acknowledgement of understanding, Labs and PMHx      Post Location: PACU                        Additional Info:Patient transported to PACU on 6LO2 simple face mask. Airway patent. Respirations regular and unlabored. Report given to RN.                                     Last OR Temp: Temperature: 36.9 C (98.4 F)  ABG:  POTASSIUM   Date Value Ref Range Status   12/16/2023 4.2 3.5 - 5.1 mmol/L Final     KETONES   Date Value Ref Range Status   12/16/2023 Negative Negative, 100  mg/dL Final     CALCIUM   Date Value Ref Range Status   12/16/2023 9.2 8.6 - 10.2 mg/dL Final     Comment:     Gadolinium-containing contrast can interfere with calcium measurement.       Calculated P Axis   Date Value Ref Range Status   08/27/2023 47 degrees Final     Calculated R Axis   Date Value Ref Range Status   08/27/2023 -33 degrees Final     Calculated T Axis   Date Value Ref Range Status   08/27/2023 17 degrees Final     Airway:* No LDAs found *  Blood pressure (!) 154/79, pulse 74,  temperature 36.9 C (98.4 F), resp. rate 17, height 1.727 m (5' 8), weight (!) 140 kg (308 lb 10.3 oz), SpO2 94%.

## 2024-03-15 NOTE — OR Nursing (Signed)
 Called Teddi (Significant Other) to give update per Dr.Hajiron.

## 2024-03-15 NOTE — Nurses Notes (Deleted)
 Anesthesia Sean Fernandez called to bedside. Patient is only oriented to self and her lungs sounds are extremely coarse. Medicine 4 was paged to [facility] phone to discuss potential ICU upgrade. Awaiting call back.

## 2024-03-15 NOTE — Anesthesia Procedure Notes (Signed)
 Sunday Eng    Arterial Line Procedure    Pt location: In OR  Consent:     Consent given by:  Patient    Risks discussed:  Bleeding, infection and pain  Universal protocol:     Procedure explained and questions answered to patient or proxy's satisfaction: yes      Immediately prior to procedure a time out was called: yes      Patient identity confirmed:  Verbally with patient, hospital-assigned identification number and arm band  Pre-procedure details:     Preparation: Preprocedure hand washing was performed; sterile field was maintained         Skin Prep used: Chlorhexidine gluconate  Anesthesia (see MAR for exact dosages):     Anesthesia method:  Under general anesthesia    A 20 G Catheter type: Arrow 1 and 3/4 inch in length,  Placed on the right  radial artery  using anatomical landmarks, guidewire and palpation With  number of attempts:1.Secured with: transparent dressing   MEDICATIONS:     Post-procedure details:    Patient tolerance of procedure:  Tolerated well, no immediate complications blood withdrawn easily, flushes easily, good waveform, Waveform appropriate, Correlates with cuff, Calibrated and Flush/aspirate well  Complications:none  Performed By:  Performing provider: Sowko, Natalie, CRNA Authorizing provider: Aden Agreste, MD  I was present and supervised/observed the entire procedure.  Elfredia Grippe, CRNA 03/15/2024, 07:20

## 2024-03-15 NOTE — Anesthesia Preprocedure Evaluation (Signed)
 ANESTHESIA PRE-OP EVALUATION  Planned Procedure: NEPHRECTOMY RADICAL ROBOTIC (Right: Kidney)  XI ROBOT SUPPLY CARD (Right)  Review of Systems     anesthesia history negative     patient summary reviewed  nursing notes reviewed        Pulmonary   sleep apnea,  not a current smoker   Cardiovascular    Hypertension, ECG reviewed and hyperlipidemia ,No peripheral edema, no CAD and no dysrhythmias,        GI/Hepatic/Renal    GERD and end-stage renal disease no liver disease        Endo/Other    morbid obesity, nocoagulation disorder and no drug induced coagulopathy   no type 2 diabetes,    Neuro/Psych/MS    ADHD. PTSD, anxiety no seizures, no CVA      peripheral neuropathy,  Cancer  CA,                 Physical Assessment      Airway       Mallampati: III      Neck ROM: full  Mouth Opening: fair.            Dental           (+) missing           Pulmonary    Breath sounds clear to auscultation  (-) no rhonchi, no decreased breath sounds, no wheezes, no rales and no stridor     Cardiovascular    Rhythm: regular  Rate: Normal  (-) no friction rub, carotid bruit is not present, no peripheral edema and no murmur     Other findings          Plan  ASA 3     Planned anesthesia type: general     general anesthesia with endotracheal tube intubation      PONV Plan:  I plan to administer pharmcologic prophalaxis antiemetics          Additional Plans: Arterial line    Intravenous induction     Anesthesia issues/risks discussed are: Dental Injuries, Post-op Agitation/Tantrum, Stroke, Aspiration, Difficult Airway, Sore Throat, Blood Loss, Intraoperative Awareness/ Recall, Cardiac Events/MI, Post-op Cognitive Dysfunction, Art Line Placement and PONV.  Anesthetic plan and risks discussed with patient  signed consent obtained      Use of blood products discussed with patient who consented to blood products.      Patient's NPO status is appropriate for Anesthesia.           Plan discussed with CRNA.

## 2024-03-15 NOTE — Anesthesia Postprocedure Evaluation (Signed)
 Anesthesia Post Op Evaluation    Patient: Sean Fernandez  Procedure(s):  NEPHRECTOMY RADICAL ROBOTIC  XI ROBOT SUPPLY CARD    Last Vitals:Temperature: 36.5 C (97.7 F) (03/15/24 1500)  Heart Rate: 64 (03/15/24 1700)  BP (Non-Invasive): 126/71 (03/15/24 1700)  Respiratory Rate: (!) 8 (03/15/24 1700)  SpO2: 94 % (03/15/24 1700)    No notable events documented.    Patient is sufficiently recovered from the effects of anesthesia to participate in the evaluation and has returned to their pre-procedure level.  Patient location during evaluation: PACU       Patient participation: complete - patient participated  Level of consciousness: awake and alert and responsive to verbal stimuli    Pain management: adequate  Airway patency: patent    Anesthetic complications: no  Cardiovascular status: acceptable  Respiratory status: acceptable  Hydration status: acceptable  Patient post-procedure temperature: Pt Normothermic   PONV Status: Absent

## 2024-03-15 NOTE — H&P (Signed)
 Urology History and Physical    Sean Fernandez  [id number]  [birth year]    Chief Complaint:   1. 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 No lymphadenopathy.      2. Past medical history significant for HTN, Meniere's, neuropathy, GERD, abnormal EKG    3. Past surgical history significant for right knee surgery, urethral surgery in childhood   4. Blood thinners: ASA 325  5. ECOG: 0 - Fully active, asymptomatic    HPI: Sean Fernandez is a 58 y.o. male with the above past medical history who reports today for RAL Right radical nephrectomy. He was last seen by [facility] 11/04/2023. No acute issues. Consent in media tab.     PMHx:   Past Medical History:   Diagnosis Date    CPAP (continuous positive airway pressure) dependence     Esophageal reflux     H/O hearing loss     High cholesterol     HTN (hypertension)     Hyperlipidemia     Meniere disease     Sleep apnea     Wears glasses            PSHx:   Past Surgical History:   Procedure Laterality Date    HX MENISCECTOMY Right     URETHRA SURGERY             Family Hx: Aaron Aas  Family Medical History:    None           Social Hx:   Social History     Socioeconomic History    Marital status: Married     Spouse name: Not on file    Number of children: Not on file    Years of education: Not on file    Highest education level: Not on file   Occupational History    Not on file   Tobacco Use    Smoking status: Never    Smokeless tobacco: Not on file   Vaping Use    Vaping status: Never Used   Substance and Sexual Activity    Alcohol use: Yes     Comment: occ    Drug use: Yes     Types: Marijuana     Comment: MEDICAL  MARIJUANA 8MG  Q OTHER DAY    Sexual activity: Not on file   Other Topics Concern    Ability to Walk 1 Flight of Steps without SOB/CP Yes    Routine Exercise No    Ability to Walk 2 Flight of Steps without SOB/CP Yes    Unable to Ambulate Not Asked    Total Care Not Asked    Ability To Do Own ADL's Yes    Uses Walker Yes    Other Activity Level Yes     Comment: WALKING OUTSIDE    Uses Cane Yes   Social History Narrative    Not on file     Social Determinants of Health     Financial Resource Strain: Not on file   Transportation Needs: Not on file   Social Connections: Not on file   Intimate Partner Violence: Low Risk  (01/13/2020)  Received from [facility]    Intimate Partner Violence     Insults You: Not on file     Threatens You: Not on file     Screams at You: Not on file     Physically Hurt: Not on file     Intimate Partner Violence Score: Not on file   Housing Stability: Not on file       Medications:   No current outpatient medications on file.       Allergies: Allergies[1]    ROS:  All 10 systems were reviewed.  There were pertinent positives in: Genital urinary system  There are no new changes to the prior reviewed review of systems.    Physical Exam:  BP (!) 151/88   Pulse 69   Temp 36.3 C (97.3 F)   Resp 13   Ht 1.727 m (5' 8)   Wt (!) 140 kg (308 lb 10.3 oz)   SpO2 96%   BMI 46.93 kg/m       General - alert/oriented; NAD  Neck - supple, trachea midline  Lungs - respirations are unlabored, good inspiratory effort  Heart - RRR, extremity pulses intact  Abdomen - soft, NT/ND  Musculoskeletal - Atraumatic extremities   Neurological - CN II-XII Grossly intact  GU system - deferred. Plan to examine in OR    Urine Dip Results:  Urine Dip Results:   Bilirubin (Ref Range: Negative mg/dL): Negative  Urine Specific Gravity (Ref Range: 1.005 - 1.030): 1.025  pH (Ref Range: 5.0 - 8.0): 5.0  Protein (Ref Range: Negative mg/dL): Negative  Nitrite (Ref Range: Negative): Negative  Leukocytes (Ref Range:  Negative WBC's/uL): Negative      Assessment:  58 y.o. male with   1. 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 Past medical history significant for HTN, Meniere's, neuropathy, GERD, abnormal EKG    3. Past surgical history significant for right knee surgery, urethral surgery in childhood   4. Blood thinners: ASA 325  5. ECOG: 0 - Fully active, asymptomatic    Plan:  To OR for RAL Right radical nephrectomy   Consent obtained  They understand the risks of the procedure which would include bleeding, infection, recurrence, damage to the surrounding structures, failure to correct pathology, loss of sensation, heart attack, stroke, and death.   All questions of patient and family answered    Odelia Bender, DO  [facility]  [place]   Department of Urology, PGY-2          03/15/2024 06:00    I saw and examined the patient.  I reviewed the resident's note.   I agree with the findings and plan of care as documented in the resident's note.   Any exceptions/additions are edited/noted.    We again discussed the risks/benefits of partial vs radical nephrectomy.  The patient elected for radical nephrectomy.    Mina Alter, MD  Assistant Professor, Surgeon   Division of Urologic Oncology  Department of Urology   [place] [facility]          [1]   Allergies  Allergen Reactions    Lisinopril      Coughing, per pt

## 2024-03-15 NOTE — OR Surgeon (Signed)
 [place]  [facility]   DEPARTMENT OF UROLOGY   OPERATION SUMMARY     PATIENT NAME: Sean Fernandez NUMBER: [id number]   DATE OF SERVICE: 03/15/2024   DATE OF BIRTH: [birth year]    PREOPERATIVE DIAGNOSIS:   1. 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 No lymphadenopathy.   2. Past medical history significant for HTN, Meniere's, neuropathy, GERD, abnormal EKG    3. Past surgical history significant for right knee surgery, urethral surgery in childhood   4. Blood thinners: ASA 325  5. ECOG: 0 - Fully active, asymptomatic    POSTOPERATIVE DIAGNOSIS:   Same     PROCEDURES:   1.  Robotic-assisted laparoscopic right radical nephrectomy (transperitoneal approach, adrenal sparing)    SURGEONS:   1.  Bernita Beckstrom, MD (Attending)  2.  Lynnea Satchel, MD (PGY4) (Resident)    ANESTHESIA:   General    ESTIMATED BLOOD LOSS:   50 mL    FLUIDS:   Per anesthesia    COMPLICATIONS:   None    DRAINS:   1. 16-French Foley catheter     SPECIMENS:   1.  Right kidney with perinephric fat and proximal ureter    INDICATIONS FOR PROCEDURE:   Tylan Kinn is a 58 y.o. male with a right renal mass who presents for robotic-assisted laparoscopic right radical nephrectomy, possible open. Prior to the procedure today, the patient's history and physical was reviewed. Informed consent was obtained and all questions were satisfactorily answered. The indications, alternatives, benefits, and risks of surgery were discussed with the patient. Risks were discussed to include pain, immediate or delayed hemorrhage requiring blood transfusion and/or additional interventions, lymphatic leak,  infection, bowel injury requiring immediate and/or future open surgery and possibly temporary or permanent stool diversion, injury to blood vessels, injury to surrounding organs/structures, lymphatic leak, postoperative ileus, need for nasogastric decompression, parenteral nutrition, need for hospital readmission, incisional dehiscence, potential nerve injury from either positioning or the surgery itself, loss of muscle strength, need for prolonged rehabilitation, and risks of surgery and anesthesia in general to include myocardial infarction, cerebrovascular accident, need for prolonged ventilation, pulmonary embolus, deep vein thrombosis, and even death. There is a risk of the patient requiring subsequent procedures to manage complications associated with this surgery. There is also a risk that the cancer may return and despite aggressive treatment, may be incurable leading to death. In addition, risks inherent to this particular procedure were emphasized, including pneumothorax, injury to the liver, injury to the duodenum, injury to the inferior vena cava, injury to the pancreas, local or distant cancer recurrence or progression, operating on a benign tumor, and renal failure (acute or chronic). There is a risk of converting a laparoscopic procedure to an open procedure due to bleeding, failure to progress, or other unforeseen issues or complications. If conversion is through a flank approach, there is the possibility of a flank bulge associated with loss or diminished muscular tone and there is a risk of incisional hernia. The patient acknowledged and communicated understanding of these risks and wishes to proceed with surgery.    OPERATIVE FINDINGS:  1.  The right kidney with  perinephric fat and proximal ureter were completely excised without any gross spillage of tumor.  2.  There were no immediate complications.  3.  There was no apparent injury to the liver, pancreas, large or small bowel, or surrounding  structures.  4.  The right adrenal gland was spared.     DESCRIPTION OF THE PROCEDURE:    The patient was transferred to the operating room and general anesthesia was administered. Appropriate peripheral IVs were placed by the anesthesia team. The patient was placed in the left lateral decubitus position, stabilized to the table, and pressure points were appropriately padded. An axillary roll was placed. The abdomen was prepped and draped in normal sterile fashion. A 16-French Foley catheter was placed to gravity drainage. An orogastric tube was placed to decompress the stomach and bowel.  A Veress needle was passed in the right lower quadrant with a single pass. With irrigation and aspiration, there was no evidence for blood, gas or stool. Carbon dioxide pneumoperitoneum was achieved. Thereafter, we placed an 8 mm robotic trocar in the right paramedian line, midway between the umbilicus and the anterior ribs. Once the trocar was placed under direct vision, we inspected the internal contents.  There was no injury from the Veress needle. There was no injury to bowel or bleeding. Thereafter, we placed 3 x additional robotic trocars to include the right upper quadrant, right lateral quadrant and right lower quadrant, all within the paramedian line.  Next, we placed a 5 mm trocar in the upper midline.  This was used for fixed liver retraction during the case. We placed a 10 mm trocar in the midline just above the umbilicus, and this was used for bedside assistance during the case. Once the trocars were positioned, the liver was retracted cephalad, and the robot was docked. Robotically, we mobilized the ascending colon medially.  The duodenum was dissected away from the medial aspect of the kidney.  There was no injury to the duodenum.  The vena cava was skeletonized along its anterior aspect.  We dissected lateral to the gonadal vein, establishing a plane above the psoas muscle fascia and beneath the ureter.  We worked  up towards the renal hilum. We then meticulously dissected the renal vasculature which included one artery and one vein.  Next, endovascular staplers were used to ligate and divide the renal vasculature. Following division of the renal vasculature, the proximal vessel stumps were inspected and excellent hemostasis was ensured. At this time, the upper pole attachments were taken down using electrocautery. The right adrenal gland was spared. The ureter was ligated and divided with stapler. The remaining lower pole and lateral attachments were then taken down and the kidney within perinephric fat and the proximal ureter were completely mobilized. A 15 mm trocar was introduced through the most inferior 8 mm trocar site in the right lower quadrant and the specimen was placed in a 15 mm endocatch bag. At this point, the renal fossa was irrigated and the pneumoperitoneum was decreased to 5 mmHg to inspect for hemostasis. There appeared to be excellent hemostasis at this time. The robot was undocked at this point and the abdomen was desulfated. The most inferior right lower quadrant trocar site incision was extended to create an oblique incision for specimen removal. The skin was incised with a scalpel then the subcutaneous tissue, fascial, and muscle were divided using electrocautery. The inferior epigastric artery was noted to be bleeding, and it was controlled with suture ligation using Vicryl until hemostasis was  achieved. The rest of the muscle was divided using Ligasure. The peritoneal cavity was entered and the specimen within the endocatch bag was removed intact without any gross spillage. The specimen was passed off for final pathology. Next, using No. 1 PDS running suture, the fascia and muscle were reapproximated in two layers. The subcutaneous tissue was irrigated and then reapproximated using 2-0 Vicryl. The skin was closed with running 4-0 Monocryl intradermal subcuticular sutures at all incisions and  Exofin  skin glue was applied at all of the incision sites.  At the end of the case, the sponge, needle, and instrument counts were correct. The patient tolerated the procedure well, was awakened from anesthesia and taken to recovery room in stable condition.    PLAN:    The patient will be admitted to the hospital floor after meeting anesthesia criteria. The patient will be started on a clear liquid diet today. Oral and intravenous medications will be available for pain control. The patient will wear sequential compression devices on bilateral lower extremities for DVT prophylaxis and if there is low concern for post-operative bleeding, we will start prophylactic subcutaneous Heparin tomorrow morning. The patient will be encouraged to ambulate at least four times daily and use an incentive spirometer regularly.     Lynnea Satchel, MD 10:56 03/15/2024  PGY-4 Resident  Urology  [facility]    I was present and participated in all aspects of the patient's case.   I reviewed the resident's note.   I agree with the findings and plan of care as documented in the resident's note.   Any exceptions/additions are edited/noted.    Mina Alter, MD  Assistant Professor, Surgeon   Division of Urologic Oncology  Department of Urology   [place]  [facility]

## 2024-03-16 ENCOUNTER — Other Ambulatory Visit: Payer: Self-pay

## 2024-03-16 DIAGNOSIS — Z9889 Other specified postprocedural states: Secondary | ICD-10-CM

## 2024-03-16 DIAGNOSIS — Z905 Acquired absence of kidney: Secondary | ICD-10-CM

## 2024-03-16 LAB — CBC WITH DIFF
BASOPHIL #: <0.1 10*3/uL (ref <=0.20)
BASOPHIL %: 0.2 %
EOSINOPHIL #: <0.1 10*3/uL (ref <=0.50)
EOSINOPHIL %: 0.1 %
HCT: 41.5 % (ref 38.9–52.0)
HGB: 13.9 g/dL (ref 13.4–17.5)
IMMATURE GRANULOCYTE #: <0.1 10*3/uL (ref <0.10)
IMMATURE GRANULOCYTE %: 0.5 % (ref 0.0–1.0)
LYMPHOCYTE #: 1.22 10*3/uL (ref 1.00–4.80)
LYMPHOCYTE %: 11.4 %
MCH: 30.4 pg (ref 26.0–32.0)
MCHC: 33.5 g/dL (ref 31.0–35.5)
MCV: 90.8 fL (ref 78.0–100.0)
MONOCYTE #: 0.98 10*3/uL (ref 0.20–1.10)
MONOCYTE %: 9.1 %
MPV: 9 fL (ref 8.7–12.5)
NEUTROPHIL #: 8.45 10*3/uL — ABNORMAL HIGH (ref 1.50–7.70)
NEUTROPHIL %: 78.7 %
PLATELETS: 190 10*3/uL (ref 150–400)
RBC: 4.57 10*6/uL (ref 4.50–6.10)
RDW-CV: 13.4 % (ref 11.5–15.5)
WBC: 10.7 10*3/uL (ref 3.7–11.0)

## 2024-03-16 LAB — BASIC METABOLIC PANEL
ANION GAP: 10 mmol/L (ref 4–13)
BUN/CREA RATIO: 12 (ref 6–22)
BUN: 15 mg/dL (ref 8–25)
CALCIUM: 8.1 mg/dL — ABNORMAL LOW (ref 8.6–10.2)
CHLORIDE: 100 mmol/L (ref 96–111)
CO2 TOTAL: 24 mmol/L (ref 22–30)
CREATININE: 1.22 mg/dL (ref 0.75–1.35)
ESTIMATED GFR - MALE: 69 mL/min/BSA (ref >=60)
GLUCOSE: 95 mg/dL (ref 65–125)
POTASSIUM: 4.4 mmol/L (ref 3.5–5.1)
SODIUM: 134 mmol/L — ABNORMAL LOW (ref 136–145)

## 2024-03-16 LAB — MAGNESIUM: MAGNESIUM: 1.8 mg/dL (ref 1.8–2.6)

## 2024-03-16 LAB — PHOSPHORUS: PHOSPHORUS: 4 mg/dL (ref 2.4–4.7)

## 2024-03-16 MED ORDER — SENNOSIDES 8.6 MG TABLET
8.6000 mg | ORAL_TABLET | Freq: Every evening | ORAL | 0 refills | Status: DC
Start: 2024-03-16 — End: 2024-03-29
  Filled 2024-03-16: qty 10, 10d supply, fill #0

## 2024-03-16 MED ORDER — SENNOSIDES 8.6 MG-DOCUSATE SODIUM 50 MG TABLET
1.0000 | ORAL_TABLET | Freq: Two times a day (BID) | ORAL | Status: DC
Start: 2024-03-16 — End: 2024-03-16
  Administered 2024-03-16: 1 via ORAL
  Filled 2024-03-16: qty 1

## 2024-03-16 MED ORDER — HYDROCODONE 5 MG-ACETAMINOPHEN 325 MG TABLET
1.0000 | ORAL_TABLET | ORAL | 0 refills | Status: DC | PRN
Start: 2024-03-16 — End: 2024-04-15
  Filled 2024-03-16: qty 5, 1d supply, fill #0

## 2024-03-16 NOTE — Nurses Notes (Signed)
 Patient discharged home via private vehicle wife to take patient home. IV locks x2 Dc'd. Patient pain is tolerable at this time rates it 3/10 describes as ache. Resident reports he has voided x1 large amount in bathroom and reports he has been passing a lot of gas. Denies nausea and vomiting, Patient had 2 medication from discharge pharmacy delivered to room. AVS reviewed and given to patient. Patient verbalized understanding of all discussed. Resident escorted to main entrance per transport via wheelchair.

## 2024-03-16 NOTE — Care Plan (Signed)
 [facility]  Rehabilitation Services  Physical Therapy Initial Evaluation    Patient Name: Sean Fernandez  Date of Birth: 08-02-1966  Height: Height: 172.7 cm (5' 8)  Weight: Weight: (!) 142 kg (312 lb 6.3 oz)  Room/Bed: 879/A  Payor: Advertising copywriter / Plan: [place] [employer] CHOICE PLUS / Product Type: Non Managed Care /     Assessment:      Sean Fernandez was extremely pleasant and receptive towards an intial evaluation today. Pt stated he had minimal pain that did not increase with exercise and did not formally rate. He currently presents with decreased endurance, strength and flexibility. Pt performed bed mobility, STS and ambulated w/ SBA. Pt complained of dizziness during the end of ambulating, but symtpoms resided once seated at bedside. Pt's O2 levels were monitored and maintained above 90%. Based on his performance today, Sean Fernandez would be appropriate to return home w/ assist. Pt will follow up until d/c.    Discharge Needs:    Equipment Recommendation: none anticipated (pt uses straight cane at baseline)       Discharge Disposition: home with assist    JUSTIFICATION OF DISCHARGE RECOMMENDATION   Based on current diagnosis, functional performance prior to admission, and current functional performance, this patient requires continued PT services in home with assist in order to achieve significant functional improvements in these deficit areas: aerobic capacity/endurance, ergonomics and body mechanics, gait, locomotion, and balance, joint integrity and mobility, muscle performance, ROM (range of motion), posture.        Plan:   Current Intervention: gait training, balance training, patient/family education, postural re-education, stair training, strengthening  To provide physical therapy services minimum of 1x/week  for duration of until discharge.    The risks/benefits of therapy have been discussed with the patient/caregiver and he/she is in agreement with the established plan of care.        Subjective & Objective        03/16/24 0934   Therapist Pager   PT Assigned/ Pager # Alisa App [phone]/Freddie Dymek [phone]   Rehab Session   Document Type evaluation   PT Visit Date 03/16/24   Total PT Minutes: 16   Patient Effort excellent   Symptoms Noted During/After Treatment dizziness   Symptoms Noted Comment pt reported dizziness after ambulation but improved when seated   General Information   Patient Profile Reviewed yes   Onset of Illness/Injury or Date of Surgery 03/15/24   Pertinent History of Current Functional Problem Sean Fernandez is a 58 y.o. male with the above past medical history who reports today for RAL Right radical nephrectomy. He was last seen by [facility] Urologic Oncology 11/04/2023. No acute issues. Consent in media tab.   Medical Lines PIV Line;Telemetry   Respiratory Status nasal cannula   Existing Precautions/Restrictions fall precautions;full code   Comment, General Information pt was extremely pleasant and receptive to an initial evaluation   Mutuality/Individual Preferences   Anxieties, Fears or Concerns pt expressed none   Individualized Care Needs OOB w/ FWW   Plan of Care Reviewed With patient;friend   Living Environment   Lives With spouse   Living Arrangements house   Living Environment Comment pt lives in a [facility] with his wife w/ 3 STE. the pt stated the bed and bath were on 1st floor and has tub shower w/ shower chair.   Functional Level Prior   Ambulation 1 - assistive equipment   Transferring 0 - independent   Toileting 0 - independent   Bathing  0 - independent   Prior Functional Level Comment pt utilizies cane for ambulation, otherwise IND w/ ADLs/IADLs. owns rollator if needed.   Self-Care   Activity/Exercise/Self-Care Comment pt reports no anticipated issues with self-care   Pre Treatment Status   Pre Treatment Patient Status Patient supine in bed;Call light within reach;Telephone within reach;Nurse approved session  (saftey alarm present but not active)   Support Present Pre Treatment   Other (See comments)  (friend)   Communication Pre Treatment  Nurse   Communication Pre Treatment Comment RN medically cleared treatment   Cognition   Behavior/Mood Observations behavior appropriate to situation, WNL/WFL   Orientation Status oriented x 4   Attention WNL/WFL   Follows Commands WNL   Vital Signs   Pre-Treatment Heart Rate (beats/min) 74   Pre SpO2 (%) 95   O2 Delivery Pre Treatment supplemental O2   O2 Liters Pre Treatment 4   Post SpO2 (%) 92   O2 Delivery Post Treatment supplemental O2   O2 Liters Post Treatment 4   Vitals Comment pt stated symptoms of dizziness at end of ambulation, once returned to chair symptoms decreased and O2 increased. pt does not use oxygen at baseline   Pain Assessment   Pre/Posttreatment Pain Comment pt reported minimal pain in RLQ pre-treatment but did not formally rate. Pt stated pain remained same through treatment.   RLE Assessment   RLE Assessment WFL- Within Functional Limits   LLE Assessment   LLE Assessment WFL- Within Functional Limits   Bed Mobility   Supine-Sit Independence stand-by assistance   Bed Mobility, Assistive Device bed rails;Head of Bed Elevated   Comment pt educated on log roll technique w/ adequate understanding and cues to complete   Impairments strength decreased;ROM decreased;endurance;flexibility decreased   Transfer Assessment/Treatment   Sit-Stand Independence stand-by assistance   Stand-Sit Independence stand-by assistance   Sit-Stand-Sit, Assist Device straight cane   Transfer Impairments endurance;flexibility decreased;ROM decreased;strength decreased   Gait Assessment/Treatment   Independence  stand-by assistance   Assistive Device  straight cane   Distance in Feet 250   Gait Speed decreased but functional   Impairments  endurance;flexibility decreased;ROM decreased;strength decreased;balance impaired   Balance   Sitting Balance: Static normal balance   Sitting, Dynamic (Balance) good balance   Sit-to-Stand Balance fair + balance    Standing Balance: Static fair + balance   Standing Balance: Dynamic fair + balance   Systems Impairment Contributing to Balance Disturbance musculoskeletal;neuromuscular   Identified Impairments Contributing to Balance Disturbance decreased ROM;decreased strength   Post Treatment Status   Post Treatment Patient Status Patient sitting in bedside chair or w/c;Call light within reach;Telephone within reach;Patient safety alarm activated   Support Present Post Treatment  Other (See comments)  (friend)   Film/video editor Nurse   Plan of Care Review   Plan Of Care Reviewed With patient;friend   Basic Mobility Am-PAC/6Clicks Score (APPROVED Staff)   Turning in bed without bedrails 4   Lying on back to sitting on edge of flat bed 4   Moving to and from a bed to a chair 4   Standing up from chair 4   Walk in room 3   Climbing 3-5 steps with railing 3   6 Clicks Raw Score total 22   Standardized (t-scale) score 47.4   Patient Mobility Goal (JHHLM) 7- Walk 25 feet or more 3X/day   Exercise/Activity Level Performed 8- Walked 250 feet or more   Physical Therapy Clinical Impression   Assessment Mr.  Fernandez was extremely pleasant and receptive towards an intial evaluation today. Pt stated he had minimal pain that did not increase with exercise and did not formally rate. He currently presents with decreased endurance, strength and flexibility. Pt performed bed mobility, STS and ambulated w/ SBA. Pt complained of dizziness during the end of ambulating, but symtpoms resided once seated at bedside. Pt's O2 levels were monitored and maintained above 90%. Based on his performance today, Sean Fernandez would be appropriate to return home w/ assist. Pt will follow up until d/c.   Patient/Family Goals Statement d/c as early as today (6/18)   Criteria for Skilled Therapeutic yes   Pathology/Pathophysiology Noted musculoskeletal;neuromuscular;cardiovascular;pulmonary   Impairments Found (describe specific impairments) aerobic  capacity/endurance;ergonomics and body mechanics;gait, locomotion, and balance;joint integrity and mobility;muscle performance;ROM (range of motion);posture   Functional Limitations in Following  self-care;home management;community/leisure;work   Disability: Inability to Perform community/leisure   Rehab Potential good   Therapy Frequency minimum of 1x/week   Predicted Duration of Therapy Intervention (days/wks) until discharge   Anticipated Equipment Needs at Discharge (PT) none anticipated  (pt uses straight cane at baseline)   Anticipated Discharge Disposition home with assist   Evaluation Complexity Justification   Patient History: Co-morbidity/factors that impact Plan of Care 3 or more that impact Plan of Care   Examination Components 4 or more Exam elements addressed   Presentation Evolving: Symptoms, complaints, characteristics of condition changing &/or cognitive deficits present   Clinical Decision Making Moderate complexity   Evaluation Complexity Moderate complexity   Care Plan Goals   PT Rehab Goals Bed Mobility Goal;Gait Training Goal;Stairs Training Goal;Transfer Training Goal   Bed Mobility Goal   Bed Mobility Goal, Date Established 03/16/24   Bed Mobility Goal, Time to Achieve by discharge   Bed Mobility Goal, Activity Type all bed mobility activities   Bed Mobility Goal, Current Status stand-by assistance   Bed Mobility Goal, Independence Level independent   Bed Mobility Goal, Assistive Device least restrictive assistive device   Gait Training  Goal, Distance to Achieve   Gait Training  Goal, Date Established 03/16/24   Gait Training  Goal, Time to Achieve by discharge   Gait Training  Goal, Current Status stand-by assistance   Gait Training  Goal, Independence Level modified independence   Gait Training  Goal, Assist Device least restricted assistive device   Gait Training  Goal, Distance to Achieve 300 ft   Stairs Training Goal   Stairs Training Goal, Date Established 03/16/24   Stairs Training  Goal, Time to Achieve by discharge   Stairs Training Goal, Independence Level stand-by assistance   Stairs Training Goal, Current Status modified independence   Stairs Training Goal, Assist Device least restrictive assistive device   Stairs Training Goal, Number of Stairs to Achieve 15   Transfer Training Goal   Transfer Training Goal, Date Established 03/16/24   Transfer Training Goal, Time to Achieve by discharge   Transfer Training Goal, Activity Type all transfers   Transfer Training Goal, Current Status stand-by assistance   Transfer Training Goal, Independence Level modified independence   Transfer Training Goal, Assist Device least restrictive assistive device   Planned Therapy Interventions, PT Eval   Planned Therapy Interventions (PT) gait training;balance training;patient/family education;postural re-education;stair training;strengthening       Therapist:   Rosann Commander, PHYSICAL THERAPY STUDENT   Pager #: [phone]

## 2024-03-16 NOTE — Care Management Notes (Signed)
 CCC attempted to meet with pt to complete initial assessment and D/C planning.  Pt's bedside RN was in the middle of reviewing pt's D/C instructions.    Lunda Salines RN- Nurse Case Manager  [phone]

## 2024-03-16 NOTE — Discharge Summary (Signed)
 [place]  [facility]                                              DISCHARGE SUMMARY      PATIENT NAME:  Sean Fernandez  MRN:  [id number]  DOB:  Mar 24, 1966    ADMISSION DATE:  03/15/2024  DISCHARGE DATE:  03/16/2024    ATTENDING PHYSICIAN: Mina Alter, MD  PRIMARY CARE PHYSICIAN: Jannett Mems, MD    ADMISSION DIAGNOSIS:     1. 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 No lymphadenopathy.   2. Past medical history significant for HTN, Meniere's, neuropathy, GERD, abnormal EKG    3. Past surgical history significant for right knee surgery, urethral surgery in childhood   4. Blood thinners: ASA 325  5. ECOG: 0 - Fully active, asymptomatic    DISCHARGE DIAGNOSIS: Same  Active Hospital Problems   (*Primary Problem)    Diagnosis    *Right renal mass     Chronic  Problems    Preoperative examination         Date Noted: 12/15/2023                                                       DISCHARGE MEDICATIONS:     Current Discharge Medication List        START taking these medications.        Details   HYDROcodone-acetaminophen 5-325 mg Tablet  Commonly known as: NORCO   1 Tablet, Oral, EVERY 4 HOURS PRN  Qty: 5 Tablet  Refills: 0     sennosides 8.6 mg Tablet  Commonly known as: SENNA   8.6 mg, Oral, EVERY EVENING  Qty: 10 Tablet  Refills: 0            CONTINUE these medications - NO CHANGES were made during your visit.        Details   aliskiren 300 mg Tablet  Commonly known as: TEKTURNA   300 mg, Daily  Refills: 0     * aspirin 325 mg Tablet   81 mg, Oral, Daily  Refills: 0     * aspirin 81 mg Tablet, Chewable   81 mg, Daily  Refills: 0      atomoxetine 40 mg Capsule  Commonly known as: STRATTERA   40 mg, Daily  Refills: 0     atorvastatin 20 mg Tablet  Commonly known as: LIPITOR   20 mg, EVERY EVENING  Refills: 0     * cloNIDine HCL 0.1 mg Tablet  Commonly known as: CATAPRES   0.1 mg, 2 TIMES DAILY PRN  Refills: 0     * cloNIDine HCL 0.1 mg Tablet  Commonly known as: CATAPRES   0.1 mg, DAILY  Refills: 0     MAXIMUM RED KRILL OMEGA-3 ORAL   DAILY  Refills: 0  meclizine 25 mg Tablet  Commonly known as: ANTIVERT   25 mg, 2 TIMES DAILY PRN  Refills: 0     pentoxifylline 400 mg Tablet Sustained Release  Commonly known as: TRENtal   400 mg, 3 TIMES DAILY  Refills: 0     pregabalin 75 mg Capsule  Commonly known as: LYRICA   75 mg, 3 TIMES DAILY  Refills: 0     scopolamine 1 mg over 3 days patch 3 day  Commonly known as: TRANSDERM SCOP   1 Patch, EVERY 72 HOURS  Refills: 0     tolterodine 4 mg Capsule, Sust. Release 24 hr  Commonly known as: DETROL LA   4 mg, Daily  Refills: 0     verapamiL 240 mg Tablet Sustained Release   240 mg, EVERY MORNING  Refills: 0           * This list has 4 medication(s) that are the same as other medications prescribed for you. Read the directions carefully, and ask your doctor or other care provider to review them with you.                ASK your doctor about these medications.        Details   cloNIDine 0.1 mg/24 hr Patch Weekly  Commonly known as: CATAPRES-TTS   0.1 mg, EVERY 7 DAYS  Refills: 0     dextroamphetamine-amphetamine 10 mg Tablet  Commonly known as: ADDERALL   20 mg, Daily  Refills: 0     losartan 100 mg Tablet  Commonly known as: COZAAR   50 mg, Daily  Refills: 0     omeprazole 40 mg Capsule, Delayed Release(E.C.)  Commonly known as: PRILOSEC   40 mg, Daily  Refills: 0     vitamin D3-folic acid 250 mcg (10,000 unit)-1 mg Tablet   1 Tablet, Daily  Refills: 0              DISCHARGE INSTRUCTIONS:      BASIC METABOLIC PANEL    To be performed the same day as (but prior to) Urology clinic appointment.     Release to  patient Automated [100001]      AMB CONSULT/REFERRAL UROLOGY/[facility]C [facility])   Referral Type: Physician Referral-Office Visits   Number of Visits Requested: 1     DISCHARGE INSTRUCTION - POST-SURGICAL PAIN    These recommendations are based on the   assumption that you have no medical contra-indications to receive (non-steroidal antiinflammatory drugs) NSAIDs such as ibuprofen or acetaminophen. If you have been previously   told not to take these medications, please ignore these instructions. Acetaminophen 650mg    may be administered every 4 hours. Your dosage should not exceed 4 grams in one day. This   may be taken on a scheduled basis for the first 24 hours following discharge. This may also be   taken as needed, but not to exceed the above recommendations. 800mg  Ibuprofen may be   taken on an as needed basis every 6 hours. You should not exceed 3.2 grams in one day. You   may alternate acetaminophen and ibuprofen for 24 hours. For example, at noon take one   800mg  ibuprofen and 625mg  of acetominophen. At 4pm take another 625mg  of   acetominophen. At 6pm take 800mg  of ibuprofen again. This regimen may be conducted for 24   hours. If you have been prescribed a pain medication containing acetaminophen (norco or   Percocet) you must be sure not to exceed  the recommended daily amount.  -Discharge Instruction - Opioids - You have been prescribed a course of opioid medications for   post-operative pain. These medications should only be used for severe pain not well controlled   with other medications. They can cause increased confusion, lethargy     DISCHARGE INSTRUCTION - SIGNS AND SYMPTOMS OF INFECTION    Please watch your incision/wound   site for the following signs of potential infection: increased redness or warmth. Drainage from   the wound that may be foul smelling, cloudy, yellow or green in color. Bulging or increased   swelling at the incision site. A temperature of more than 101.32F by mouth for two readings    taken 4 hours apart. A sudden increase in pain at the wound that is not relieved with pain   medication.     DISCHARGE INSTRUCTION - DIET     Diet: RESUME HOME DIET      DISCHARGE INSTRUCTION - ACTIVITY     Activity: NO LIFTING OVER 10 POUNDS      DISCHARGE INSTRUCTION - REGARDING SHOWERING AND INCISION CARE    You may shower tomorrow. Keep your incisions clean, dry, and open to   air. You should let clean, soapy water to run over the incisions at least daily. No swimming or   submerging the wound such as in a hot tub, pool or pond. Pat the wound dry. Do not rub your   wound. Please seek medical attention if: There is redness, swelling, or increased pain in the   wound that is not controlled with pain medications. There is drainage, blood or pus coming   from your wound lasting for longer than 1 day or sooner if there is concern. You develop signs   of generalized infection including muscle aches, chills, fever, or a general ill feeling. You notice a   foul smell coming from the wound or dressing. You develop persistent nausea or vomiting.     DISCHARGE INSTRUCTION - MISC    YOU MAY RESTART TAKING YOUR ASPIRIN TOMORROW, 6/19       REASON FOR HOSPITALIZATION AND HOSPITAL COURSE:    57 y.o. male with above history who presented to [facility] for planned surgical intervention. The patient was taken to the OR on 03/15/2024 for:   Robotic-assisted laparoscopic right radical nephrectomy (transperitoneal approach, adrenal sparing)   The patient tolerated the procedure well and was transferred to the PACU in stable condition. He was admitted to the Urology Service for pain control and close post-operative monitoring. The patient progressed well throughout his hospitalization and by post-operative day 1, he was tolerating a regular diet, his pain was controlled with PRN PO medication, and he was ambulating without difficulty. Based upon clinical findings and examination, he was deemed appropriate to discharge. The patient will be  discharged with pain medication, stool softeners. The patient was instructed to call our [facility] Urology office at [phone] with any questions, concerns, or to confirm future appointments. The patient was instructed to call 911 or present to the emergency room if he develops increased pain, fever, nausea, vomiting, or if his overall generall condition worsens. Discharge instructions were reviewed with the patient and all questions were answered. He will follow-up with [facility] Urology in 2 weeks for POC. BMP at time of follow up.     DISCHARGE DISPOSITION:  Home discharge     cc: Primary Care Physician:  Jannett Mems, MD  [address]  [place] [address]  Verneda Golder, MD   [place]  [facility]  Department of Urology    03/16/2024, 14:31      I was immediately available for this patient encounter.  I reviewed the resident's note.   I agree with the findings and plan of care as documented in the resident's note.   Any exceptions/additions are edited/noted.    Mina Alter, MD  Assistant Professor, Surgeon   Division of Urologic Oncology  Department of Urology   [place]  [facility]

## 2024-03-16 NOTE — Care Plan (Signed)
 [facility]  Rehabilitation Services  Occupational Therapy Initial Evaluation    Patient Name: Sean Fernandez  Date of Birth: 1966/08/06  Height: Height: 172.7 cm (5' 8)  Weight: Weight: (!) 142 kg (312 lb 6.3 oz)  Room/Bed: 879/A  Payor: Advertising copywriter / Plan: [place] [employer] CHOICE PLUS / Product Type: Non Managed Care /     Assessment:   [name] performed well during OT evaluation this date. Pt presents POD 1 s/p robotic-assisted laparoscopic right radical nephrectomy. Pt's mobility and ADL participation are mildly limited by post-op pain/discomfort, decreased flexibility, mild balance deficits, and decreased activity tolerance, though was able to participate in functional bed mobility via logroll technique, STS transfers, and ambulation of 250 feet with use of straight cane, SBA provided for safety, and increased time and effort required. Discussed sequencing and compensatory techniques to perform LB ADLs with good understanding as well as use of shower chair to promote safety and energy conservation with bathing routine. From OT perspective, anticipate safe d/c home with assist once medically appropriate with no current DME needs. OT will continue to follow.      Discharge Needs:   Equipment Recommendation: none anticipated    Discharge Disposition: home with assist    Plan:   Current Intervention: ADL retraining, IADL retraining, balance training, bed mobility training, endurance training, strengthening, therapeutic exercise, transfer training    To provide Occupational therapy services 1x/day, minimum of 2x/week, until discharge, until goals are met.     The risks/benefits of therapy have been discussed with the patient/caregiver and he/she is in agreement with the established plan of care.       Subjective & Objective        03/16/24 0935   Therapist Pager   OT Assigned/ Pager # Zelda Reames [phone]   Rehab Session   Document Type evaluation   OT Visit Date 03/16/24   Total OT Minutes: 16    Patient Effort good   Symptoms Noted During/After Treatment dizziness   Symptoms Noted Comment Sudden onset of dizziness following ambulatory tasks but improved when seated.   General Information   Patient Profile Reviewed yes   Onset of Illness/Injury or Date of Surgery 03/15/24   Pertinent History of Current Functional Problem Sean Fernandez is a 58 y.o. male POD 1 s/p Robotic-assisted laparoscopic right radical nephrectomy.   Medical Lines PIV Line;Telemetry   Respiratory Status nasal cannula  (4L)   Existing Precautions/Restrictions full code;fall precautions   Pre Treatment Status   Pre Treatment Patient Status Patient supine in bed;Call light within reach;Telephone within reach;Patient safety alarm activated;Nurse approved session   Support Present Pre Treatment  Other (See comments)  (Friend)   Communication Pre Treatment  Nurse   Communication Pre Treatment Comment Cleared for participation per bedside RN, Olivia. Seen alongside PT for professional assistance.   Mutuality/Individual Preferences   Individualized Care Needs Goes by Sean Fernandez; OOB with FWW and assist x1; encourage upright position in chair TID for all meals and independence with ADLs   Patient-Specific Goals (Include Timeframe) To get better and go home   Plan of Care Reviewed With patient;significant other   Living Environment   Lives With spouse   Living Arrangements house   Home Assessment: No Problems Identified   Home Accessibility stairs to enter home;bed and bath on same level;tub/shower is not walk in   Living Environment Comment Pt lives with his wife in a 2-story home with 3 STE and main level setup. Tub/shower combo with shower  chair present for bathing tasks. He will have assistance from a friend for ~2 weeks as his wife returns to work next week.   Home Main Entrance   Number of Stairs, Main Entrance three   Functional Level Prior   Ambulation 1 - assistive equipment   Transferring 0 - independent   Toileting 0 - independent    Bathing 1 - assistive equipment   Dressing 0 - independent   Eating 0 - independent   Communication 0 - understands/communicates without difficulty   Prior Functional Level Comment Pt uses a straight cane for mobility and also owns a rollator. He is generally independent with his ADL routine and uses a shower chair within tub/shower combo. He works for a [employer].   Self-Care   Equipment Currently Used at Home yes   Equipment Currently Used at [employer], Engineer, water, Armed forces training and education officer to Hospital cane, straight   Vital Signs   Pre-Treatment Heart Rate (beats/min) 79   Post-treatment Heart Rate (beats/min) 71   Pre SpO2 (%) 94   O2 Delivery Pre Treatment supplemental O2   O2 Liters Pre Treatment 4;via nasal canula   Post SpO2 (%) 93   O2 Delivery Post Treatment supplemental O2   O2 Liters Post Treatment 4;via nasal canula   Pain Assessment   Pre/Posttreatment Pain Comment Pt reporting minimal post-op pain though did not formally rate.   Coping/Psychosocial   Observed Emotional State calm;cooperative;pleasant   Verbalized Emotional State acceptance   Coping/Psychosocial Response Interventions   Plan Of Care Reviewed With patient;friend   Cognition   Behavior/Mood Observations alert;behavior appropriate to situation, WNL/WFL;cooperative   Orientation Status oriented x 4   Attention WNL/WFL   Follows Commands WNL;WFL   RUE Assessment   RUE Assessment WFL- Within Functional Limits   LUE Assessment   LUE Assessment WFL- Within Functional Limits   Mobility Assessment/Training   Mobility Comment Pt ambulated functional mobility distance of 250 feet with use of straight cane, SBA provided for safety and increased time and effort required though no overt LOB noted throughout.   Bed Mobility   Supine-Sit Independence contact guard assist   Sit to Supine, Independence not tested   Bed Mobility, Assistive Device Head of Bed Elevated;bed rails   Comment Cues for logroll technique to minimize  abdominal pain and core engagement.   Impairments balance impaired;endurance;flexibility decreased;pain   Transfer Assessment/Treatment   Sit-Stand Independence stand-by assistance   Stand-Sit Independence stand-by assistance   Sit-Stand-Sit, Assist Device straight cane   Transfer Impairments balance impaired;endurance;flexibility decreased;pain   ADL Assessment/Intervention   ADL Comments Discussed sequencing and compensatory techniques to perform LB dressing tasks with good understanding. Was on his way to the bathroom following hallway mobility though sudden onset of dizziness required him to return to seated position. Able to complete seated UB ADLs without difficulty. Will continue to assess as appropriate.   Motor Skills/Interventions   Additional Documentation Functional Endurance (Group)   Balance   Comment with use of straight cane   Sitting Balance: Static good balance   Sitting, Dynamic (Balance) good balance   Sit-to-Stand Balance fair + balance   Standing Balance: Static fair + balance   Standing Balance: Dynamic fair + balance   Systems Impairment Contributing to Balance Disturbance musculoskeletal;neuromuscular   Identified Impairments Contributing to Balance Disturbance decreased ROM;decreased strength   Functional Endurance Training   Comment, Functional Endurance fair +   Post Treatment Status   Post Treatment Patient Status Patient sitting  in bedside chair or w/c;Call light within reach;Telephone within reach;Patient safety alarm activated   Support Present Post Treatment  Other (See comments)  (Friend)   Communication Post Treatement Nurse   Care Plan Goals   OT Rehab Goals Occupational Therapy Goal;Bed Mobility Goal;Grooming Goal;LB Dressing Goal;Toileting Goal;Transfer Training Goal 2   Occupational Therapy Goals   OT Goal, Date Established 03/16/24   OT Goal, Time to Achieve by discharge   OT Goal, Activity Type ambulate functional household distance with LRAD and mod (I) to promote IND with  mobility-related ADLs and IADLs   OT Goal, Independence Level modified independence   Bed Mobility Goal   Bed Mobility Goal, Date Established 03/16/24   Bed Mobility Goal, Time to Achieve by discharge   Bed Mobility Goal, Activity Type all bed mobility activities   Bed Mobility Goal, Independence Level modified independence   Grooming Goal   Grooming Goal, Date Established 03/16/24   Grooming Goal, Time to Achieve by discharge   Grooming Goal, Activity Type all grooming tasks   Grooming Goal, Independence  modified independence   Grooming Goal, Position standing   Grooming Goal, Additional Goal standing grooming routine   LB Dressing Goal   LB Dressing Goal, Date Established 03/16/24   LB Dressing Goal, Time to Achieve by discharge   LB Dressing Goal, Activity Type all lower body dressing tasks   LB Dressing Goal, Independence Level modified independence   LB Dressing Goal, Adaptive Equipment reacher;shoe horn, long handled;sock-aid   LB Dressing Goal, Additional Goal seated and standing   Toileting Goal   Toileting Goal, Date Established 03/16/24   Toileting Goal, Time to Achieve by discharge   Toileting Goal, Activity Type all toileting tasks   Toileting Goal, Independence Level modified independence   Transfer Training Goal 2   Transfer Training Goal, Date Established 03/16/24   Transfer Training Goal, Time to Achieve by discharge   Transfer Training Goal, Activity Type all transfers   Transfer Training Goal, Independence Level modified independence   Transfer Training Goal, Assist Device other (see comments)  (LRAD)   Planned Therapy Interventions, OT Eval   Planned Therapy Interventions ADL retraining;IADL retraining;balance training;bed mobility training;endurance training;strengthening;therapeutic exercise;transfer training   Clinical Impression   Functional Level at Time of Session Sean Fernandez performed well during OT evaluation this date. Pt presents POD 1 s/p robotic-assisted laparoscopic right radical  nephrectomy. Pt's mobility and ADL participation are mildly limited by post-op pain/discomfort, decreased flexibility, mild balance deficits, and decreased activity tolerance, though was able to participate in functional bed mobility via logroll technique, STS transfers, and ambulation of 250 feet with use of straight cane, SBA provided for safety, and increased time and effort required. Discussed sequencing and compensatory techniques to perform LB ADLs with good understanding as well as use of shower chair to promote safety and energy conservation with bathing routine. From OT perspective, anticipate safe d/c home with assist once medically appropriate with no current DME needs. OT will continue to follow.   Criteria for Skilled Therapeutic Interventions Met (OT) yes;meets criteria;skilled treatment is necessary   Rehab Potential good   Therapy Frequency 1x/day;minimum of 2x/week   Predicted Duration of Therapy until discharge;until goals are met   Anticipated Equipment Needs at Discharge none anticipated   Anticipated Discharge Disposition home with assist   Highest level of Mobility score   Exercise/Activity Level Performed 8- Walked 250 feet or more   Evaluation Complexity Justification   Occupational Profile Review Brief history  Performance Deficits 3-5 deficits;Pain;Mobility;Balance;Endurance  Engineer, structural)   Clinical Decision Making Moderate analytic complexity   Evaluation Complexity Moderate     OT Education: Patient/family educated on Role of OT at [facility], energy conservation strategies during ADL, falls prevention, home safety strategies, importance of participating in OOB activity and ADL routine during hospital stay.  Patient/family verbalized/demonstrated initial understanding, would benefit from reinforcement to ensure carry over.    Therapist:   Boone Buzzard, MOT, OTR/L   Pager #: [phone]

## 2024-03-16 NOTE — Progress Notes (Signed)
 [place]  [facility]  UROLOGY PROGRESS NOTE   Patient: Sean, Fernandez, 58 y.o. male  Date of Admission:  03/15/2024  Date of Birth:  Nov 23, 1965  Date of Service:  03/16/2024     ASSESSMENT:    58 y.o. male:    1. 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 No lymphadenopathy.   2. Past medical history significant for HTN, Meniere's, neuropathy, GERD, abnormal EKG    3. Past surgical history significant for right knee surgery, urethral surgery in childhood   4. Blood thinners: ASA 325  5. ECOG: 0 - Fully active, asymptomatic    Now post-operative day #1 status post:    1. Robotic-assisted laparoscopic right radical nephrectomy (transperitoneal approach, adrenal sparing)      PLAN:   - Advance to regular diet  - Discontinue maintenance intravenous fluids  - Remove foley catheter  - Remove arterial line  - Encourage out of bed, ambulation, incentive spirometry  - Home medications as appropriate    Diet: Regular   Anticoagulation: None   Pain regimen: Tylenol, Norco PRN, Toradol  PRN   Bowel regimen: Senokot   Foley catheter: Remove   Drain: None   Antibiotics: None   IV fluids: Discontinue   Microbiology: None   Imaging: None   PT/OT recs: Ordered     DISPOSITION: Step down status, anticipate discharge pending clinical course     SUBJECTIVE:   No acute events overnight. Patient feeling well on AM rounds, pain well controlled. No fevers or chills. No nausea or emesis after clear liquid diet. Looking forward to regular food.     OBJECTIVE:   Temperature: 36.7 C (98.1 F)  Heart Rate: 61  BP (Non-Invasive): 134/74  Respiratory Rate: (!) 10  SpO2: 95 %  PHYSICAL  EXAM:  General: 58 y.o. male not in acute distress.    Skin: Warm and dry.     Eyes: Eyes are clear.    Pulmonary: Respiratory effort is unlabored on room air.   Psychiatric: Patient is alert, appropriate mood.    Cardiovascular: Normal heart rate, appears well perfused.    Neurologic: Grossly intact, awake and alert.     Gastrointestinal: Abdomen soft, non-distended, appropriately tender, incisions clean/dry/intact.   Genitourinary: Foley catheter draining clear yellow urine.        ATTESTATION:   Woodie Hazard, MD   [place]  [facility]  Department of Urology    03/16/2024, 06:36      I was immediately available for this patient encounter.  I reviewed the resident's note.   I agree with the findings and plan of care as documented in the resident's note.   Any exceptions/additions are edited/noted.    Mina Alter, MD  Assistant Professor, Surgeon   Division of Urologic Oncology  Department of Urology   [facility]

## 2024-03-21 ENCOUNTER — Other Ambulatory Visit: Payer: Self-pay

## 2024-03-23 ENCOUNTER — Other Ambulatory Visit: Payer: Self-pay

## 2024-03-23 NOTE — Progress Notes (Signed)
 Specialty Pharmacy Refill Coordination Note  Travis Wheeler is a 58 y.o. male contacted today regarding refills of specialty medication(s) Bictegravir-Emtricitab-Tenofov (Biktarvy )   Patient requested Delivery   Delivery date: 03/25/24   Verified address: [address] [place] [address]   Medication will be filled on 03/24/24.

## 2024-03-24 ENCOUNTER — Other Ambulatory Visit: Payer: Self-pay

## 2024-03-24 ENCOUNTER — Encounter (HOSPITAL_BASED_OUTPATIENT_CLINIC_OR_DEPARTMENT_OTHER): Payer: Self-pay | Admitting: Family

## 2024-03-24 DIAGNOSIS — C641 Malignant neoplasm of right kidney, except renal pelvis: Principal | ICD-10-CM

## 2024-03-24 LAB — SURGICAL PATHOLOGY SPECIMEN
ADDITIONAL DIMENSION IN CENTIMETERS (CM): 3
ADDITIONAL DIMENSION IN CENTIMETERS (CM): 4
MARGIN STATUS: NEGATIVE
PERCENTAGE OF TUMOR NECROSIS: 10

## 2024-03-24 NOTE — Cancer Center Note (Signed)
 UROLOGIC ONCOLOGY SURGERY NOTE    CHIEF COMPLAINT:   Post-operative visit     SUBJECTIVE:  58 y.o. male presents today for a postoperative visit. The patient tolerated the procedure well and continues to recover without any issues. The patient is tolerating a regular diet, having regular bowel movements,and ambulating without issues. The patient denies any fevers, chills, chest pain, shortness of breath, nausea, vomiting, diarrhea, or any other associated symptoms at this time.     OBJECTIVE:  BP (!) 157/75 Comment: APRN notified  Pulse 69   Temp 36.8 C (98.3 F)   Resp 18   Ht 1.727 m (5' 8)   Wt (!) 138 kg (304 lb 4.8 oz)   SpO2 97%   BMI 46.27 kg/m       General: Patient is vitally stable (see values). Appears calm and at rest. Dressed appropriately.    Skin: Warm and dry.     Eyes: Eyes are clear.    Pulmonary: Respiratory effort is unlabored.     Psychiatric: Patient is alert, appropriate mood, and in no acute distress.     Musculoskeletal: Patient ambulates without assistance   Cardiovascular: Palpable peripheral pulses.    Neurologic: Neurological exam is consistent with patient's age.      Gastrointestinal: The abdomen is soft, nontender and non-distended.   Well-healing trocar site incisions from recent surgery, clean, dry, intact, no evidence of infection or hernia.    Genitourinary: No costovertebral angle tenderness bilaterally.  No suprapubic fullness or tenderness.     ASSESSMENT:  58 y.o. male with:    1. 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 Reviewed pathology report with the patient and provided the patient with a printed copy  2. Patient is established with local urologist for renal stone.   3. Offered referral to nephrology for medical management of solitary kidney, he declined but will establish with local nephrologist via his primary care team.   4. Follow-up in 4 months with CT abdomen and BMP with  telephone visit to review, or sooner if needed.     ATTESTATION:  Datha Kissinger L. Ranon Coven, MBA, MSN, APRN   [facility]   Urologic Oncology Surgery   Phone [phone]  Pager [facility]

## 2024-03-25 ENCOUNTER — Other Ambulatory Visit: Payer: Self-pay

## 2024-03-25 ENCOUNTER — Ambulatory Visit: Payer: Medicare (Managed Care) | Attending: Urology

## 2024-03-25 DIAGNOSIS — Z905 Acquired absence of kidney: Secondary | ICD-10-CM | POA: Insufficient documentation

## 2024-03-25 LAB — BASIC METABOLIC PANEL
ANION GAP: 10 mmol/L (ref 4–13)
BUN/CREA RATIO: 12 (ref 6–22)
BUN: 15 mg/dL (ref 8–25)
CALCIUM: 8.8 mg/dL (ref 8.6–10.2)
CHLORIDE: 106 mmol/L (ref 96–111)
CO2 TOTAL: 26 mmol/L (ref 22–30)
CREATININE: 1.24 mg/dL (ref 0.75–1.35)
ESTIMATED GFR - MALE: 67 mL/min/BSA (ref >=60)
GLUCOSE: 102 mg/dL (ref 65–125)
POTASSIUM: 4.7 mmol/L (ref 3.5–5.1)
SODIUM: 142 mmol/L (ref 136–145)

## 2024-03-28 ENCOUNTER — Other Ambulatory Visit: Payer: Self-pay

## 2024-03-29 ENCOUNTER — Encounter (HOSPITAL_BASED_OUTPATIENT_CLINIC_OR_DEPARTMENT_OTHER): Payer: Self-pay | Admitting: Family

## 2024-03-29 ENCOUNTER — Ambulatory Visit: Payer: Medicare (Managed Care) | Attending: Family | Admitting: Family

## 2024-03-29 VITALS — BP 157/75 | HR 69 | Temp 98.3°F | Resp 18 | Ht 68.0 in | Wt 304.3 lb

## 2024-03-29 DIAGNOSIS — C649 Malignant neoplasm of unspecified kidney, except renal pelvis: Secondary | ICD-10-CM

## 2024-03-29 DIAGNOSIS — Z905 Acquired absence of kidney: Secondary | ICD-10-CM | POA: Insufficient documentation

## 2024-03-29 DIAGNOSIS — C641 Malignant neoplasm of right kidney, except renal pelvis: Secondary | ICD-10-CM | POA: Insufficient documentation

## 2024-03-29 DIAGNOSIS — N2889 Other specified disorders of kidney and ureter: Secondary | ICD-10-CM

## 2024-03-29 DIAGNOSIS — N200 Calculus of kidney: Secondary | ICD-10-CM | POA: Insufficient documentation

## 2024-03-29 DIAGNOSIS — Z483 Aftercare following surgery for neoplasm: Secondary | ICD-10-CM | POA: Insufficient documentation

## 2024-03-29 DIAGNOSIS — Z08 Encounter for follow-up examination after completed treatment for malignant neoplasm: Secondary | ICD-10-CM

## 2024-03-29 DIAGNOSIS — Z85528 Personal history of other malignant neoplasm of kidney: Secondary | ICD-10-CM

## 2024-04-06 ENCOUNTER — Encounter (HOSPITAL_BASED_OUTPATIENT_CLINIC_OR_DEPARTMENT_OTHER): Payer: Self-pay | Admitting: Family

## 2024-04-13 ENCOUNTER — Other Ambulatory Visit: Payer: Self-pay

## 2024-04-15 ENCOUNTER — Other Ambulatory Visit: Payer: Self-pay

## 2024-04-15 ENCOUNTER — Emergency Department (HOSPITAL_COMMUNITY): Admission: AD | Admit: 2024-04-15 | Payer: Medicare (Managed Care) | Source: Ambulatory Visit

## 2024-04-15 ENCOUNTER — Encounter (HOSPITAL_COMMUNITY): Payer: Self-pay

## 2024-04-15 ENCOUNTER — Emergency Department (HOSPITAL_COMMUNITY): Payer: Medicare (Managed Care)

## 2024-04-15 ENCOUNTER — Ambulatory Visit (INDEPENDENT_AMBULATORY_CARE_PROVIDER_SITE_OTHER): Payer: Medicare (Managed Care) | Admitting: Family

## 2024-04-15 ENCOUNTER — Ambulatory Visit (HOSPITAL_COMMUNITY)
Admission: RE | Admit: 2024-04-15 | Discharge: 2024-04-15 | Disposition: A | Discharge status: Home / Self Care | Payer: Medicare (Managed Care) | Source: Ambulatory Visit

## 2024-04-15 ENCOUNTER — Encounter (INDEPENDENT_AMBULATORY_CARE_PROVIDER_SITE_OTHER): Payer: Self-pay | Admitting: Family

## 2024-04-15 ENCOUNTER — Emergency Department
Admission: EM | Admit: 2024-04-15 | Discharge: 2024-04-15 | Disposition: A | Discharge status: Home / Self Care | Payer: Medicare (Managed Care) | Attending: Emergency Medicine | Admitting: Emergency Medicine

## 2024-04-15 VITALS — BP 151/88 | HR 73 | Temp 98.7°F | Wt 296.0 lb

## 2024-04-15 DIAGNOSIS — T148XXA Other injury of unspecified body region, initial encounter: Secondary | ICD-10-CM | POA: Insufficient documentation

## 2024-04-15 DIAGNOSIS — M19072 Primary osteoarthritis, left ankle and foot: Secondary | ICD-10-CM | POA: Insufficient documentation

## 2024-04-15 DIAGNOSIS — L819 Disorder of pigmentation, unspecified: Secondary | ICD-10-CM

## 2024-04-15 DIAGNOSIS — S9032XA Contusion of left foot, initial encounter: Secondary | ICD-10-CM

## 2024-04-15 DIAGNOSIS — Z905 Acquired absence of kidney: Secondary | ICD-10-CM | POA: Insufficient documentation

## 2024-04-15 DIAGNOSIS — M7989 Other specified soft tissue disorders: Secondary | ICD-10-CM | POA: Insufficient documentation

## 2024-04-15 DIAGNOSIS — M79672 Pain in left foot: Secondary | ICD-10-CM | POA: Insufficient documentation

## 2024-04-15 NOTE — ED Provider Notes (Signed)
 Norleen Ruth, MD  [facility] - Emergency Department  Emergency Department Visit Note      Chief Complaint:  L foot pain/bruising     HISTORY OF PRESENT ILLNESS     Sean Fernandez, date of birth September 02, 1966, is a 58 y.o.male who presents to the Emergency Department on 04/15/2024 with complaints of new bruising to left foot/toes that he noticed this morning, denies injury/trauma. States he went to bed last night around 0100 and did not notice any bruising. Patient was seen by UC today and was told to come to the ED to rule out blood clots. Patient reports he had right Kidney Removed on 03/15/2024. Denies history of diabetes, use of blood thinners.     REVIEW OF SYSTEMS     The pertinent positive and negative symptoms are as per HPI. All other systems reviewed and are negative.     PATIENT HISTORY     Past Medical History:  Past Medical History:   Diagnosis Date    CPAP (continuous positive airway pressure) dependence     Esophageal reflux     H/O hearing loss     High cholesterol     HTN (hypertension)     Hyperlipidemia     Meniere disease     Sleep apnea     Wears glasses        Past Surgical History:  Past Surgical History:   Procedure Laterality Date    Hx meniscectomy Right     Nephrectomy radical      Urethra surgery         Family History:  Family Medical History:    None           Social History:  Social History[1]    Medications:  Current Outpatient Medications   Medication Sig    acetaminophen  (TYLENOL ) 500 mg Oral Tablet Take 2 Tablets (1,000 mg total) by mouth Every 4 hours as needed for Pain    aliskiren (TEKTURNA) 300 mg Oral Tablet Take 1 Tablet (300 mg total) by mouth Daily    aspirin 81 mg Oral Tablet, Chewable Chew 1 Tablet (81 mg total) Daily    atomoxetine  (STRATTERA ) 40 mg Oral Capsule Take 1 Capsule (40 mg total) by mouth Daily    atorvastatin  (LIPITOR) 20 mg Oral Tablet Take 1 Tablet (20 mg total) by mouth Every evening    cholecalciferol, vitamin D3, (VITAMIN D3 ORAL) Take 6,000 Units  by mouth Daily    cloNIDine HCL (CATAPRES) 0.1 mg Oral Tablet Take 1 Tablet (0.1 mg total) by mouth Once a day (Patient not taking: Reported on 04/15/2024)    dextroamphetamine-amphetamine (ADDERALL) 10 mg Oral Tablet Take 2 Tablets (20 mg total) by mouth Daily (Patient not taking: Reported on 04/15/2024)    Ibuprofen (MOTRIN) 200 mg Oral Tablet Take 2 Tablets (400 mg total) by mouth Four times a day as needed for Pain    krill/om-3/dha/epa/phospho/ast (MAXIMUM RED KRILL OMEGA-3 ORAL) Take 1,500 mg by mouth Once a day    meclizine (ANTIVERT) 25 mg Oral Tablet Take 1 Tablet (25 mg total) by mouth Twice per day as needed    MEDICAL MARIJUANA (FOR DOCUMENTATION PURPOSES ONLY)     pentoxifylline (TRENTAL) 400 mg Oral Tablet Sustained Release Take 1 Tablet (400 mg total) by mouth Three times a day    pregabalin  (LYRICA ) 75 mg Oral Capsule Take 1 Capsule (75 mg total) by mouth Three times a day    scopolamine  (TRANSDERM SCOP ) 1 mg over  3 days Transdermal patch 3 day Place 1 Patch on the skin Every 72 hours    tolterodine  (DETROL  LA) 4 mg Oral Capsule, Sust. Release 24 hr Take 1 Capsule (4 mg total) by mouth Daily    verapamiL  240 mg Oral Tablet Sustained Release Take 1 Tablet (240 mg total) by mouth Every morning (Patient not taking: Reported on 04/15/2024)       Allergies:  Allergies[2]    Physical Exam     Vital Signs:  ED Triage Vitals [04/15/24 1617]   BP (Non-Invasive) (!) 165/99   Heart Rate 77   Respiratory Rate 18   Temperature 36.7 C (98 F)   SpO2 98 %   Weight 132 kg (291 lb)   Height 1.727 m (5' 8)       Constitutional: Ambulatory. Nontoxic. No acute distress.   Head: Normocephalic and atraumatic.   ENT: Moist mucous membranes. No erythema or exudates in the oropharynx. Normal voice.  Eyes: EOM are normal. Pupils are equal, round, and reactive to light. No scleral icterus.   Neck: Neck supple. FROM neck.  Cardiovascular: Normal rate and regular rhythm. No murmur heard. 2+ distal pulses all 4  extremities.  Pulmonary/Chest: Effort normal and breath sounds normal.   Abdominal: Soft with no distension. No abdominal tenderness  Back: There is no CVA tenderness.   Musculoskeletal:  No clubbing or cyanosis.  Neurological: Patient is alert and awake. Strength and sensation normal in all extremities. Normal facial symmetry and speech.   Skin: Bruising to dorsum of the 2, 3, and 4th digits, from base up, less than 2 second capillary refill. Skin is warm and dry.     Diagnostics     Labs:  No results found for any visits on 04/15/24.  Labs reviewed and interpreted by me.    Radiology:   XR FOOT LEFT   Final Result      Degenerative changes. No acute abnormalities.               Radiologist location ID: [id number]           Interpreted by radiologist and reviewed by me.    ED Progress Note/ Medical Decision Making     Old records reviewed by me:  I reviewed the patient's past medical records.    Orders Placed This Encounter    XR FOOT LEFT    PERIPHERAL VENOUS DUPLEX - LOWER            Medical Decision Making      SUMMARY:     Sean Fernandez, date of birth May 21, 1966, is a 58 y.o.male who presents to the Emergency Department on 04/15/2024 with complaints of new bruising to left foot/toes that he noticed this morning, denies injury/trauma. States he went to bed last night around 0100 and did not notice any bruising. Patient was seen by UC today and was told to come to the ED to rule out blood clots. Patient reports he had right Kidney Removed on 03/15/2024. Denies history of diabetes, use of blood thinners.     WORKUP:     Foot XR ordered initially.     19:51: Initial evaluation is complete at this time. I discussed with the patient that I would order US  DVT to further evaluate. Patient is agreeable with the treatment plan at this time.     FINAL DETERMINATION      22:08: On recheck, the patient is in no acute distress. I explained the results of the diagnostic  studies. Patient is to follow up with John Veltman, MD  in one week. I discussed the diagnosis, disposition, and follow-up plan. The patient understood and will follow in accordance with the treatment plan at this time. Patient is to return here to the Emergency Department if new or worsening symptoms appear. All of their questions have been answered to their satisfaction. The patient is in stable condition at the time of discharge.     CORE MEASURES      Not applicable    CRITICAL CARE TIME      Not applicable     PRE-DISPOSITION VITALS      Pre-disposition vitals:  Filed Vitals:    04/15/24 1951 04/15/24 2000 04/15/24 2015 04/15/24 2030   BP:  (!) 139/90 (!) 140/86 (!) 149/88   Pulse:       Resp: 18      Temp:       SpO2:  96% 97% 96%       CLINICAL IMPRESSION     Clinical Impression   Leg swelling   Bruising (Primary)       DISPOSITION/PLAN     Discharged    Prescriptions:  New Prescriptions    No medications on file        Follow-Up:  Veltman, John, MD  [address]  [place] [address]  [phone]    In 1 week        Condition at Disposition: Stable        SCRIBE ATTESTATION STATEMENT  I Aamilah Augenstein, SCRIBE scribed for Jama Norleen Mouse, MD.     Documentation assistance provided for Jama Norleen Mouse, MD by [facility], SCRIBE. Information recorded by the scribe was done at my direction and has been reviewed and validated by me Jama Norleen Mouse, MD.          [1]   Social History  Tobacco Use    Smoking status: Never   Vaping Use    Vaping status: Never Used   Substance Use Topics    Alcohol use: Yes     Comment: occ    Drug use: Yes     Types: Marijuana     Comment: MEDICAL MARIJUANA 8MG  Q OTHER DAY   [2]   Allergies  Allergen Reactions    Lisinopril      Coughing, per pt
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
## 2024-04-15 NOTE — Discharge Instructions (Signed)
 Thank you for choosing [facility] for your emergency health care needs. Please follow up your symptoms and any and all findings on labs or imaging that was obtained today with your primary provider. It has been our privilege to take care of you today. If at any time you feel that your condition is worsening, call your doctor or return to the Emergency Department for reevaluation. CALL 911 IF YOU THINK YOU ARE HAVING A MEDICAL EMERGENCY. Return to the Emergency Department if you are unable to obtain the recommended follow-up treatment or you are not better as expected.

## 2024-04-15 NOTE — ED Nurses Note (Signed)
 Patient discharged home with family.  AVS reviewed with patient/care giver.  A written copy of the AVS and discharge instructions was given to the patient/care giver.  Questions sufficiently answered as needed.  Patient/care giver encouraged to follow up with PCP as indicated.  In the event of an emergency, patient/care giver instructed to call 911 or go to the nearest emergency room.         Current Discharge Medication List        CONTINUE these medications - NO CHANGES were made during your visit.        Details   acetaminophen  500 mg Tablet  Commonly known as: TYLENOL    1,000 mg, EVERY 4 HOURS PRN  Refills: 0     aliskiren 300 mg Tablet  Commonly known as: TEKTURNA   300 mg, Daily  Refills: 0     aspirin 81 mg Tablet, Chewable   81 mg, Daily  Refills: 0     atomoxetine  40 mg Capsule  Commonly known as: STRATTERA    40 mg, Daily  Refills: 0     atorvastatin  20 mg Tablet  Commonly known as: LIPITOR   20 mg, EVERY EVENING  Refills: 0     cloNIDine HCL 0.1 mg Tablet  Commonly known as: CATAPRES   0.1 mg, DAILY  Refills: 0     dextroamphetamine-amphetamine 10 mg Tablet  Commonly known as: ADDERALL   20 mg, Daily  Refills: 0     Ibuprofen 200 mg Tablet  Commonly known as: MOTRIN   400 mg, 4 TIMES DAILY PRN  Refills: 0     MAXIMUM RED KRILL OMEGA-3 ORAL   1,500 mg, DAILY  Refills: 0     meclizine 25 mg Tablet  Commonly known as: ANTIVERT   25 mg, 2 TIMES DAILY PRN  Refills: 0     MEDICAL MARIJUANA (FOR DOCUMENTATION PURPOSES ONLY)   No dose, route, or frequency recorded.  Refills: 0     pentoxifylline 400 mg Tablet Sustained Release  Commonly known as: TRENtal   400 mg, 3 TIMES DAILY  Refills: 0     pregabalin  75 mg Capsule  Commonly known as: LYRICA    75 mg, 3 TIMES DAILY  Refills: 0     scopolamine  1 mg over 3 days patch 3 day  Commonly known as: TRANSDERM SCOP    1 Patch, EVERY 72 HOURS  Refills: 0     tolterodine  4 mg Capsule, Sust. Release 24 hr  Commonly known as: DETROL  LA   4 mg, Daily  Refills: 0     verapamiL   240 mg Tablet Sustained Release   240 mg, EVERY MORNING  Refills: 0     VITAMIN D3 ORAL   6,000 Units, Daily  Refills: 0
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
## 2024-04-15 NOTE — Progress Notes (Signed)
 Specialty Pharmacy Refill Coordination Note  Travis Wheeler is a 58 y.o. male contacted today regarding refills of specialty medication(s) Bictegravir-Emtricitab-Tenofov (Biktarvy )   Patient requested Delivery   Delivery date: 04/19/24   Verified address: [address] [place] [address]   Medication will be filled on 04/18/24.

## 2024-04-18 ENCOUNTER — Other Ambulatory Visit: Payer: Self-pay

## 2024-05-02 ENCOUNTER — Other Ambulatory Visit: Payer: Self-pay

## 2024-05-02 ENCOUNTER — Ambulatory Visit: Payer: Medicare (Managed Care)

## 2024-05-02 DIAGNOSIS — N182 Chronic kidney disease, stage 2 (mild): Secondary | ICD-10-CM | POA: Insufficient documentation

## 2024-05-02 DIAGNOSIS — I10 Essential (primary) hypertension: Secondary | ICD-10-CM | POA: Insufficient documentation

## 2024-05-02 LAB — RENAL FUNCTION PANEL
ALBUMIN: 4.1 g/dL (ref 3.5–5.0)
ANION GAP: 11 mmol/L (ref 4–13)
BUN/CREA RATIO: 17 (ref 6–22)
BUN: 22 mg/dL (ref 8–25)
CALCIUM: 9.3 mg/dL (ref 8.6–10.2)
CHLORIDE: 102 mmol/L (ref 96–111)
CO2 TOTAL: 26 mmol/L (ref 22–30)
CREATININE: 1.28 mg/dL (ref 0.75–1.35)
GLUCOSE: 145 mg/dL — ABNORMAL HIGH (ref 65–125)
PHOSPHORUS: 3.1 mg/dL (ref 2.4–4.7)
POTASSIUM: 4.4 mmol/L (ref 3.5–5.1)
SODIUM: 139 mmol/L (ref 136–145)
eGFRcr - MALE: 65 mL/min/BSA (ref >=60)

## 2024-05-02 LAB — VITAMIN D 25 TOTAL: VITAMIN D 25, TOTAL: 16.2 ng/mL — ABNORMAL LOW (ref 20.0–100.0)

## 2024-05-02 LAB — URINALYSIS WITH MICROSCOPIC REFLEX IF INDICATED BMC/JMC ONLY
BILIRUBIN: NEGATIVE mg/dL
BLOOD: NEGATIVE mg/dL
GLUCOSE: NEGATIVE mg/dL
KETONES: NEGATIVE mg/dL
LEUKOCYTES: NEGATIVE WBCs/uL
NITRITE: NEGATIVE
PH: 6.5 (ref <8.0)
PROTEIN: NEGATIVE mg/dL
SPECIFIC GRAVITY: 1.02 (ref <1.022)
UROBILINOGEN: 2 mg/dL (ref <=2.0)

## 2024-05-02 LAB — MICROALBUMIN/CREATININE RATIO, URINE, RANDOM
CREATININE RANDOM URINE: 109 mg/dL
MICROALBUMIN RANDOM URINE: 0.8 mg/dL
MICROALBUMIN/CREATININE RATIO RANDOM URINE: 7.3 mg/g (ref <30.0)

## 2024-05-02 LAB — ALBUMIN FOR ELECTROPHORESIS: ALBUMIN: 4.3 g/dL (ref 3.5–5.0)

## 2024-05-02 LAB — PROTEIN FOR ELECTROPHORESIS: PROTEIN TOTAL: 7.4 g/dL (ref 6.0–7.9)

## 2024-05-02 LAB — URIC ACID: URIC ACID: 9.4 mg/dL — ABNORMAL HIGH (ref 3.5–7.5)

## 2024-05-02 LAB — PARATHYROID HORMONE (PTH): PTH: 62.1 pg/mL (ref 8.5–77.0)

## 2024-05-03 LAB — KAPPA AND LAMBDA FREE LIGHT CHAINS, SERUM
KAPPA FREE LIGHT CHAINS: 2.1 mg/dL (ref 1.25–3.25)
KAPPA/LAMBDA FLC RATIO: 1.27 (ref 0.80–2.10)
LAMBDA FREE LIGHT CHAINS: 1.65 mg/dL (ref 0.60–2.70)

## 2024-05-03 LAB — MONOCLONAL GAMMOPATHY PROFILE WITH IMMUNOTYPING REFLEX
ALBUMIN: 4.3 g/dL
KAPPA FREE LIGHT CHAINS: 2.1 mg/dL (ref 1.25–3.25)
KAPPA/LAMBDA FLC RATIO: 1.27 (ref 0.80–2.10)
LAMBDA FREE LIGHT CHAINS: 1.65 mg/dL (ref 0.60–2.70)
TOTAL PROTEIN: 7.4 g/dL

## 2024-05-11 ENCOUNTER — Other Ambulatory Visit: Payer: Self-pay

## 2024-05-12 ENCOUNTER — Other Ambulatory Visit: Payer: Self-pay

## 2024-05-13 ENCOUNTER — Other Ambulatory Visit: Payer: Self-pay | Admitting: Pharmacy Technician

## 2024-05-13 ENCOUNTER — Other Ambulatory Visit: Payer: Self-pay

## 2024-05-13 NOTE — Progress Notes (Signed)
 Specialty Pharmacy Refill Coordination Note  Travis Wheeler is a 58 y.o. male contacted today regarding refills of specialty medication(s) Bictegravir-Emtricitab-Tenofov (Biktarvy )   Patient requested Delivery   Delivery date: 05/17/24   Verified address: [address]  [place] [address]   Medication will be filled on 05/16/24.

## 2024-05-16 ENCOUNTER — Other Ambulatory Visit: Payer: Self-pay

## 2024-06-08 ENCOUNTER — Other Ambulatory Visit: Payer: Self-pay

## 2024-06-15 ENCOUNTER — Other Ambulatory Visit (HOSPITAL_COMMUNITY): Payer: Self-pay

## 2024-06-15 ENCOUNTER — Other Ambulatory Visit: Payer: Self-pay

## 2024-06-15 NOTE — Progress Notes (Signed)
 Specialty Pharmacy Refill Coordination Note  Spoke with Travis Wheeler  Travis Wheeler is a 58 y.o. male contacted today regarding refills of specialty medication(s) Bictegravir-Emtricitab-Tenofov (Biktarvy )  Doses on hand: 10  Patient requested: Delivery   Delivery date: 06/20/24   Verified address: [address] [place] [address]  Medication will be filled on 06/17/24.

## 2024-06-16 ENCOUNTER — Other Ambulatory Visit: Payer: Self-pay

## 2024-06-16 NOTE — Progress Notes (Signed)
 Specialty Pharmacy Ongoing Clinical Assessment Note  Travis Wheeler is a 58 y.o. male who is being followed by the specialty pharmacy service for RxSp HIV   Patient's specialty medication(s) reviewed today: Bictegravir-Emtricitab-Tenofov (Biktarvy )   Missed doses in the last 4 weeks: 0   Patient/Caregiver did not have any additional questions or concerns.   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 Patient will maintain adherence. Viral load remains undetectable long term.          Follow up: 12 months  Travis Wheeler Specialty Pharmacist

## 2024-06-21 ENCOUNTER — Encounter (HOSPITAL_BASED_OUTPATIENT_CLINIC_OR_DEPARTMENT_OTHER): Payer: Self-pay | Admitting: Urology

## 2024-07-04 ENCOUNTER — Other Ambulatory Visit: Payer: Self-pay

## 2024-07-04 MED ORDER — DOXYCYCLINE HYCLATE 100 MG PO TABS: 200.0000 mg | ORAL_TABLET | Freq: Once | ORAL | 5 refills | Status: AC | PRN

## 2024-07-12 ENCOUNTER — Other Ambulatory Visit: Payer: Self-pay | Admitting: Pharmacy Technician

## 2024-07-12 ENCOUNTER — Other Ambulatory Visit: Payer: Self-pay

## 2024-07-12 NOTE — Progress Notes (Signed)
 Specialty Pharmacy Refill Coordination Note  Travis Wheeler is a 58 y.o. male contacted today regarding refills of specialty medication(s) Bictegravir-Emtricitab-Tenofov (Biktarvy )   Patient requested Delivery   Delivery date: 07/15/24   Verified address: [address]  [place]   Medication will be filled on 07/14/24.

## 2024-07-13 ENCOUNTER — Other Ambulatory Visit: Payer: Self-pay

## 2024-07-19 ENCOUNTER — Encounter (HOSPITAL_BASED_OUTPATIENT_CLINIC_OR_DEPARTMENT_OTHER): Payer: Self-pay | Admitting: Family

## 2024-07-26 ENCOUNTER — Encounter (HOSPITAL_BASED_OUTPATIENT_CLINIC_OR_DEPARTMENT_OTHER): Payer: Self-pay

## 2024-08-04 ENCOUNTER — Other Ambulatory Visit (HOSPITAL_COMMUNITY): Payer: Self-pay

## 2024-08-04 ENCOUNTER — Ambulatory Visit (HOSPITAL_BASED_OUTPATIENT_CLINIC_OR_DEPARTMENT_OTHER): Payer: Self-pay | Admitting: Family

## 2024-08-08 ENCOUNTER — Other Ambulatory Visit: Payer: Self-pay

## 2024-08-10 ENCOUNTER — Other Ambulatory Visit: Payer: Self-pay

## 2024-08-10 ENCOUNTER — Other Ambulatory Visit: Payer: Self-pay | Admitting: Pharmacy Technician

## 2024-08-10 NOTE — Progress Notes (Signed)
 Specialty Pharmacy Refill Coordination Note  Travis Wheeler is a 58 y.o. male contacted today regarding refills of specialty medication(s) Bictegravir-Emtricitab-Tenofov (Biktarvy )   Patient requested Delivery   Delivery date: 08/12/24   Verified address: [address]  [place]   Medication will be filled on: 08/11/24

## 2024-08-11 ENCOUNTER — Other Ambulatory Visit: Payer: Self-pay

## 2024-08-11 NOTE — Progress Notes (Signed)
 Clinical Intervention Note  Clinical Intervention Notes: Patient reports starting citalopram and a seven day course of alprazolam, no DDIs were identified with this Biktarvy .   Clinical Intervention Outcomes: Prevention of an adverse drug event   Travis Wheeler Blair Karel Santa

## 2024-08-12 ENCOUNTER — Ambulatory Visit (INDEPENDENT_AMBULATORY_CARE_PROVIDER_SITE_OTHER): Payer: Medicare (Managed Care) | Admitting: Student in an Organized Health Care Education/Training Program

## 2024-08-12 ENCOUNTER — Other Ambulatory Visit: Payer: Self-pay

## 2024-08-12 ENCOUNTER — Encounter (INDEPENDENT_AMBULATORY_CARE_PROVIDER_SITE_OTHER): Payer: Self-pay | Admitting: Student in an Organized Health Care Education/Training Program

## 2024-08-12 VITALS — BP 123/76 | HR 81 | Temp 99.3°F | Resp 22 | Wt 304.0 lb

## 2024-08-12 DIAGNOSIS — B349 Viral infection, unspecified: Secondary | ICD-10-CM

## 2024-08-12 LAB — POC COVID-19, FLU A/B, RSV RAPID BY PCR (RESULTS)
INFLUENZA VIRUS A, PCR 4PLEX, POC: NEGATIVE
INFLUENZA VIRUS B, PCR 4PLEX, POC: NEGATIVE
RSV, PCR 4PLEX, POC: NEGATIVE
SARS-COV-2, POC: NEGATIVE

## 2024-08-31 ENCOUNTER — Ambulatory Visit: Payer: Medicare (Managed Care)

## 2024-09-02 ENCOUNTER — Other Ambulatory Visit (HOSPITAL_COMMUNITY): Payer: Self-pay

## 2024-09-06 ENCOUNTER — Other Ambulatory Visit (HOSPITAL_COMMUNITY): Payer: Self-pay

## 2024-09-07 ENCOUNTER — Other Ambulatory Visit: Payer: Self-pay

## 2024-09-07 ENCOUNTER — Ambulatory Visit: Payer: Medicare (Managed Care) | Attending: Family

## 2024-09-07 DIAGNOSIS — C649 Malignant neoplasm of unspecified kidney, except renal pelvis: Secondary | ICD-10-CM | POA: Insufficient documentation

## 2024-09-07 LAB — BASIC METABOLIC PANEL
ANION GAP: 8 mmol/L (ref 4–13)
BUN/CREA RATIO: 15 (ref 6–22)
BUN: 20 mg/dL (ref 8–25)
CALCIUM: 9 mg/dL (ref 8.6–10.2)
CHLORIDE: 101 mmol/L (ref 96–111)
CO2 TOTAL: 31 mmol/L — ABNORMAL HIGH (ref 22–30)
CREATININE: 1.31 mg/dL (ref 0.75–1.35)
GLUCOSE: 104 mg/dL (ref 65–125)
POTASSIUM: 4.2 mmol/L (ref 3.5–5.1)
SODIUM: 140 mmol/L (ref 136–145)
eGFRcr - MALE: 63 mL/min/1.73mˆ2 (ref >=60)

## 2024-09-08 ENCOUNTER — Other Ambulatory Visit: Payer: Self-pay

## 2024-09-08 NOTE — Progress Notes (Signed)
 Specialty Pharmacy Refill Coordination Note  Travis Wheeler is a 58 y.o. male contacted today regarding refills of specialty medication(s) Bictegravir-Emtricitab-Tenofov (Biktarvy )   Patient requested Delivery   Delivery date: 09/13/24   Verified address: [address]  [place]   Medication will be filled on: 09/12/24

## 2024-09-11 MED ORDER — IOPAMIDOL 370 MG IODINE/ML (76 %) INTRAVENOUS SOLUTION
100.0000 mL | INTRAVENOUS | Status: AC
  Administered 2024-09-14: 09:00:00 105 mL via INTRAVENOUS

## 2024-09-12 ENCOUNTER — Other Ambulatory Visit: Payer: Self-pay

## 2024-09-13 ENCOUNTER — Other Ambulatory Visit: Payer: Self-pay

## 2024-09-14 ENCOUNTER — Ambulatory Visit
Admission: RE | Admit: 2024-09-14 | Discharge: 2024-09-14 | Disposition: A | Payer: Medicare (Managed Care) | Source: Ambulatory Visit | Attending: Family

## 2024-09-14 DIAGNOSIS — N281 Cyst of kidney, acquired: Secondary | ICD-10-CM

## 2024-09-14 DIAGNOSIS — C649 Malignant neoplasm of unspecified kidney, except renal pelvis: Secondary | ICD-10-CM

## 2024-09-14 NOTE — Cancer Center Note (Signed)
 UROLOGY ONCOLOGY, [facility]  [address]  [place] [address]  Operated by [facility]  Telephone Visit    Name:  Sean Fernandez MRN: [id number]   Date:  09/15/2024 DOB: 11/02/1965 (59 y.o.)          The patient/family initiated a request for telephone service.  Verbal consent for this service was obtained from the patient/family.  Reason for audio only:{Audio reason:[id number]}    Last office visit in this department: 03/29/2024      Reason for call:     1. 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 He denies any changes in his health since he was last seen in the clinic. He denies any GU complaints today.     Plan:  CT abdomen showed?  Follow up in 6 months with CT C/A, these can be completed locally with telephone visit to review.     No diagnosis found.    {LOS Determination:54221:::1}    Jeoffrey Barber, APRN, CNP

## 2024-09-15 ENCOUNTER — Ambulatory Visit: Payer: Medicare (Managed Care) | Attending: Family | Admitting: Family

## 2024-09-15 ENCOUNTER — Other Ambulatory Visit: Payer: Self-pay

## 2024-09-15 DIAGNOSIS — Z9889 Other specified postprocedural states: Secondary | ICD-10-CM

## 2024-09-15 DIAGNOSIS — Z905 Acquired absence of kidney: Secondary | ICD-10-CM

## 2024-09-15 DIAGNOSIS — Z85528 Personal history of other malignant neoplasm of kidney: Secondary | ICD-10-CM

## 2024-09-15 DIAGNOSIS — N200 Calculus of kidney: Secondary | ICD-10-CM

## 2024-09-15 DIAGNOSIS — N281 Cyst of kidney, acquired: Secondary | ICD-10-CM

## 2024-09-15 DIAGNOSIS — C649 Malignant neoplasm of unspecified kidney, except renal pelvis: Secondary | ICD-10-CM

## 2024-10-05 ENCOUNTER — Other Ambulatory Visit: Payer: Self-pay

## 2024-10-07 ENCOUNTER — Other Ambulatory Visit: Payer: Self-pay

## 2024-10-07 NOTE — Progress Notes (Signed)
 Specialty Pharmacy Refill Coordination Note  Cornell Bourbon is a 60 y.o. male contacted today regarding refills of specialty medication(s) Bictegravir-Emtricitab-Tenofov (Biktarvy )   Patient requested Delivery   Delivery date: 10/13/24   Verified address: [address]  [place]   Medication will be filled on: 10/12/24

## 2024-10-12 ENCOUNTER — Other Ambulatory Visit: Payer: Self-pay

## 2024-10-18 ENCOUNTER — Encounter (HOSPITAL_BASED_OUTPATIENT_CLINIC_OR_DEPARTMENT_OTHER): Payer: Self-pay

## 2024-11-02 ENCOUNTER — Other Ambulatory Visit: Payer: Self-pay

## 2024-11-02 ENCOUNTER — Encounter: Payer: Self-pay | Admitting: Internal Medicine

## 2024-11-02 ENCOUNTER — Other Ambulatory Visit (HOSPITAL_COMMUNITY): Payer: Self-pay

## 2024-11-02 ENCOUNTER — Ambulatory Visit: Payer: PRIVATE HEALTH INSURANCE | Admitting: Internal Medicine

## 2024-11-02 DIAGNOSIS — B20 Human immunodeficiency virus [HIV] disease: Secondary | ICD-10-CM

## 2024-11-02 DIAGNOSIS — Z21 Asymptomatic human immunodeficiency virus [HIV] infection status: Secondary | ICD-10-CM

## 2024-11-02 LAB — CBC WITH DIFFERENTIAL/PLATELET
Absolute Lymphocytes: 1420 {cells}/uL (ref 850–3900)
Absolute Monocytes: 328 {cells}/uL (ref 200–950)
Basophils Absolute: 29 {cells}/uL (ref 0–200)
Basophils Relative: 0.7 %
Eosinophils Absolute: 50 {cells}/uL (ref 15–500)
Eosinophils Relative: 1.2 %
HCT: 41.7 % (ref 39.4–51.1)
Hemoglobin: 13.7 g/dL (ref 13.2–17.1)
MCH: 31 pg (ref 27.0–33.0)
MCHC: 32.9 g/dL (ref 31.6–35.4)
MCV: 94.3 fL (ref 81.4–101.7)
MPV: 10.9 fL (ref 7.5–12.5)
Monocytes Relative: 7.8 %
Neutro Abs: 2373 {cells}/uL (ref 1500–7800)
Neutrophils Relative %: 56.5 %
Platelets: 312 10*3/uL (ref 140–400)
RBC: 4.42 Million/uL (ref 4.20–5.80)
RDW: 13.1 % (ref 11.0–15.0)
Total Lymphocyte: 33.8 %
WBC: 4.2 10*3/uL (ref 3.8–10.8)

## 2024-11-02 LAB — LIPID PANEL
Cholesterol: 182 mg/dL
HDL: 55 mg/dL
LDL Cholesterol (Calc): 89 mg/dL
Non-HDL Cholesterol (Calc): 127 mg/dL
Total CHOL/HDL Ratio: 3.3 (calc)
Triglycerides: 291 mg/dL — ABNORMAL HIGH

## 2024-11-02 LAB — COMPLETE METABOLIC PANEL WITHOUT GFR
AG Ratio: 1.7 (calc) (ref 1.0–2.5)
ALT: 29 U/L (ref 9–46)
AST: 22 U/L (ref 10–35)
Albumin: 4.8 g/dL (ref 3.6–5.1)
Alkaline phosphatase (APISO): 132 U/L (ref 35–144)
BUN: 7 mg/dL (ref 7–25)
CO2: 27 mmol/L (ref 20–32)
Calcium: 9.5 mg/dL (ref 8.6–10.3)
Chloride: 104 mmol/L (ref 98–110)
Creat: 0.94 mg/dL (ref 0.70–1.30)
Globulin: 2.8 g/dL (ref 1.9–3.7)
Glucose, Bld: 87 mg/dL (ref 65–99)
Potassium: 4 mmol/L (ref 3.5–5.3)
Sodium: 140 mmol/L (ref 135–146)
Total Bilirubin: 0.3 mg/dL (ref 0.2–1.2)
Total Protein: 7.6 g/dL (ref 6.1–8.1)

## 2024-11-02 MED ORDER — BIKTARVY 50-200-25 MG PO TABS
1.0000 | ORAL_TABLET | Freq: Every day | ORAL | 11 refills | Status: AC
  Filled 2024-11-02: qty 30, 30d supply, fill #0

## 2024-11-02 MED ORDER — PRAVASTATIN SODIUM 10 MG PO TABS
10.0000 mg | ORAL_TABLET | Freq: Every day | ORAL | 11 refills | Status: AC
  Filled 2024-11-02: qty 30, 30d supply, fill #0

## 2024-11-02 NOTE — Progress Notes (Unsigned)
 "      [facility]     HPI: Travis Wheeler is a 59 y.o. male presents for HIV management. No missed doses of biktarvy . Sexually active with partner.    Past Medical History:  Diagnosis Date   History of kidney stones    HIV infection (HCC)    Pneumonia    Prostate cancer (HCC)    Seasonal allergies     Past Surgical History:  Procedure Laterality Date   EXAMINATION UNDER ANESTHESIA N/A 03/17/2014   Procedure: EXAM UNDER ANESTHESIA AND EXCISION OF ANAL MASS;  Surgeon: Lynda Leos, MD;  Location: [facility] OR;  Service: General;  Laterality: N/A;   LYMPH NODE DISSECTION Bilateral 10/02/2017   Procedure: BILATERAL PELVIC LYMPH NODE DISSECTION;  Surgeon: Devere Lonni Righter, MD;  Location: [facility];  Service: Urology;  Laterality: Bilateral;   PILONIDAL CYST EXCISION     PROSTATE BIOPSY     ROBOT ASSISTED LAPAROSCOPIC RADICAL PROSTATECTOMY N/A 10/02/2017   Procedure: XI ROBOTIC ASSISTED LAPAROSCOPIC  PROSTATECTOMY;  Surgeon: Devere Lonni Righter, MD;  Location: [facility];  Service: Urology;  Laterality: N/A;    Family History  Problem Relation Age of Onset   Hypertension Mother    Cancer Father        Colorectal   Alcoholism Father    Rectal cancer Father    Diabetes Maternal Grandmother    Hypertension Maternal Grandmother    Diabetes Paternal Grandmother    Hypertension Paternal Grandmother    Cancer Maternal Aunt        great aunt/breast ca/mastectomy   Breast cancer Maternal Aunt        mastectomy   Cancer Cousin        second cousin/unknown type   Medications Ordered Prior to Encounter[1]  Allergies[2]    Lab Results HIV 1 RNA Quant  Date Value  12/09/2023 NOT DETECTED copies/mL  11/20/2022 Not Detected Copies/mL  09/25/2021 Not Detected Copies/mL   CD4 T Cell Abs (/uL)  Date Value  12/09/2023 390 (L)  11/20/2022 374 (L)  09/25/2021 408   No results found for: HIV1GENOSEQ Lab Results  Component Value Date   WBC 4.5  12/09/2023   HGB 14.2 12/09/2023   HCT 41.5 12/09/2023   MCV 91.4 12/09/2023   PLT 269 12/09/2023    Lab Results  Component Value Date   CREATININE 1.03 12/09/2023   BUN 9 12/09/2023   NA 140 12/09/2023   K 3.8 12/09/2023   CL 104 12/09/2023   CO2 28 12/09/2023   Lab Results  Component Value Date   ALT 29 12/09/2023   AST 23 12/09/2023   ALKPHOS 97 03/05/2017   BILITOT 0.5 12/09/2023    Lab Results  Component Value Date   CHOL 199 12/09/2023   TRIG 258 (H) 12/09/2023   HDL 51 12/09/2023   LDLCALC 111 (H) 12/09/2023   Lab Results  Component Value Date   HAV POSITIVE (A) 03/25/2013   Lab Results  Component Value Date   HEPBSAG NEGATIVE 03/25/2013   HEPBSAB Reactive (A) 03/25/2013   Lab Results  Component Value Date   HCVAB NEGATIVE 03/25/2013   Lab Results  Component Value Date   CHLAMYDIAWP Negative 12/09/2023   N Negative 12/09/2023   No results found for: GCPROBEAPT Lab Results  Component Value Date   VIRGILIO LAY 03/25/2013    Assessment/Plan #HIV Asymptomatic -[id number], VL nd on 12/09/23 HIV labs Coninue biktarvy  F/u I on year  #doxy pep  #Vaccination  COVID 07/02/24 Flu 07/02/24 Monkeypox PCV-20 needs Meningitis utd HepA HEpB Tdap 2023 Shingles  #Health maintenance -Quantiferon -RPR 1:2 on 12/09/23 today -HCV today -GC egative -Lipid today, on statin -Dysplasia screen/->  NILM on 12/23/23 -Colonoscopy 07/2016    [name], MD [facility]     [1]  Current Outpatient Medications on File Prior to Visit  Medication Sig Dispense Refill   escitalopram (LEXAPRO) 20 MG tablet Take 20 mg by mouth daily.     Menthol, Topical Analgesic, (ICY HOT EX) Apply 1 application topically daily as needed (for muscle pain).     Menthol-Methyl Salicylate (SALONPAS JET SPRAY EX) Apply 1 spray topically daily as needed (for muscle pain).     Multiple Vitamins-Minerals (MULTI ADULT  GUMMIES) CHEW Chew by mouth.     PATADAY  0.2 % SOLN Instill 1 drop into both eyes twice daily as needed for allergies  3   pravastatin  (PRAVACHOL ) 10 MG tablet Take 1 tablet (10 mg total) by mouth daily. 30 tablet 11   acetaminophen  (TYLENOL ) 500 MG tablet Take 1,000 mg by mouth every 6 (six) hours as needed for moderate pain.     bictegravir-emtricitabine -tenofovir  AF (BIKTARVY ) 50-200-25 MG TABS tablet Take 1 tablet by mouth daily. 30 tablet 11   cetirizine  (ZYRTEC ) 10 MG tablet Take 10 mg by mouth daily.     diphenhydrAMINE  (BENADRYL  ALLERGY) 25 mg capsule 1 capsule Orally bedtime prn     doxycycline  (VIBRA -TABS) 100 MG tablet Take 2 tablets (200 mg total) by mouth once as needed for up to 1 dose (within 72 hours of unprotected sexual activity). 30 tablet 5   No current facility-administered medications on file prior to visit.  [2] No Known Allergies  "

## 2024-11-03 ENCOUNTER — Other Ambulatory Visit: Payer: Self-pay

## 2024-11-03 LAB — HEPATITIS C ANTIBODY: Hepatitis C Ab: NONREACTIVE

## 2024-11-03 LAB — SYPHILIS: RPR W/REFLEX TO RPR TITER AND TREPONEMAL ANTIBODIES, TRADITIONAL SCREENING AND DIAGNOSIS ALGORITHM: RPR Ser Ql: REACTIVE — AB

## 2024-11-04 LAB — HIV-1 RNA QUANT-NO REFLEX-BLD
HIV 1 RNA Quant: NOT DETECTED {copies}/mL
HIV-1 RNA Quant, Log: NOT DETECTED {Log_copies}/mL

## 2024-11-04 LAB — URINE CYTOLOGY ANCILLARY ONLY
Chlamydia: NEGATIVE
Comment: NEGATIVE
Comment: NORMAL
Neisseria Gonorrhea: NEGATIVE

## 2024-11-04 LAB — RPR TITER: RPR Titer: 1:1 {titer} — ABNORMAL HIGH

## 2024-11-04 LAB — T-HELPER CELLS (CD4) COUNT (NOT AT ARMC)
CD4 % Helper T Cell: 27 % — ABNORMAL LOW (ref 33–65)
CD4 T Cell Abs: 381 /uL — ABNORMAL LOW (ref 400–1790)

## 2025-03-23 ENCOUNTER — Ambulatory Visit (HOSPITAL_BASED_OUTPATIENT_CLINIC_OR_DEPARTMENT_OTHER): Payer: Self-pay | Admitting: Family
